# Patient Record
Sex: Female | Born: 1955 | Race: White | Hispanic: No | Marital: Married | Smoking: Former smoker
Health system: Southern US, Community
[De-identification: ages and names within clinical notes are randomized; demographics above are authoritative.]

## PROBLEM LIST (undated history)

## (undated) DIAGNOSIS — F32A Depression, unspecified: Secondary | ICD-10-CM

## (undated) DIAGNOSIS — E039 Hypothyroidism, unspecified: Secondary | ICD-10-CM

## (undated) DIAGNOSIS — F319 Bipolar disorder, unspecified: Secondary | ICD-10-CM

## (undated) DIAGNOSIS — C50919 Malignant neoplasm of unspecified site of unspecified female breast: Principal | ICD-10-CM

## (undated) DIAGNOSIS — M1990 Unspecified osteoarthritis, unspecified site: Secondary | ICD-10-CM

## (undated) DIAGNOSIS — F419 Anxiety disorder, unspecified: Secondary | ICD-10-CM

## (undated) DIAGNOSIS — Z923 Personal history of irradiation: Secondary | ICD-10-CM

## (undated) DIAGNOSIS — E785 Hyperlipidemia, unspecified: Secondary | ICD-10-CM

## (undated) DIAGNOSIS — F19982 Other psychoactive substance use, unspecified with psychoactive substance-induced sleep disorder: Secondary | ICD-10-CM

## (undated) DIAGNOSIS — K219 Gastro-esophageal reflux disease without esophagitis: Secondary | ICD-10-CM

## (undated) DIAGNOSIS — F329 Major depressive disorder, single episode, unspecified: Secondary | ICD-10-CM

## (undated) DIAGNOSIS — M797 Fibromyalgia: Secondary | ICD-10-CM

## (undated) HISTORY — PX: CHOLECYSTECTOMY: SHX55

## (undated) HISTORY — PX: TONSILLECTOMY: SUR1361

## (undated) HISTORY — DX: Hyperlipidemia, unspecified: E78.5

## (undated) HISTORY — DX: Other psychoactive substance use, unspecified with psychoactive substance-induced sleep disorder: F19.982

## (undated) HISTORY — PX: TOTAL ABDOMINAL HYSTERECTOMY W/ BILATERAL SALPINGOOPHORECTOMY: SHX83

## (undated) HISTORY — DX: Gastro-esophageal reflux disease without esophagitis: K21.9

## (undated) HISTORY — PX: GASTRIC BYPASS: SHX52

---

## 1998-05-27 ENCOUNTER — Encounter: Admission: RE | Admit: 1998-05-27 | Discharge: 1998-08-25 | Payer: Self-pay | Admitting: Family Medicine

## 1998-10-29 ENCOUNTER — Ambulatory Visit (HOSPITAL_COMMUNITY): Admission: RE | Admit: 1998-10-29 | Discharge: 1998-10-29 | Payer: Self-pay | Admitting: Cardiovascular Disease

## 1999-05-09 ENCOUNTER — Ambulatory Visit: Admission: RE | Admit: 1999-05-09 | Discharge: 1999-05-09 | Payer: Self-pay | Admitting: Internal Medicine

## 2000-03-30 ENCOUNTER — Other Ambulatory Visit: Admission: RE | Admit: 2000-03-30 | Discharge: 2000-03-30 | Payer: Self-pay | Admitting: Gynecology

## 2001-03-22 ENCOUNTER — Other Ambulatory Visit: Admission: RE | Admit: 2001-03-22 | Discharge: 2001-03-22 | Payer: Self-pay | Admitting: Gynecology

## 2002-03-22 ENCOUNTER — Other Ambulatory Visit: Admission: RE | Admit: 2002-03-22 | Discharge: 2002-03-22 | Payer: Self-pay | Admitting: Gynecology

## 2002-10-24 ENCOUNTER — Encounter: Payer: Self-pay | Admitting: Family Medicine

## 2002-10-24 ENCOUNTER — Encounter: Admission: RE | Admit: 2002-10-24 | Discharge: 2002-10-24 | Payer: Self-pay | Admitting: Family Medicine

## 2002-11-07 ENCOUNTER — Encounter (INDEPENDENT_AMBULATORY_CARE_PROVIDER_SITE_OTHER): Payer: Self-pay

## 2002-11-07 ENCOUNTER — Observation Stay (HOSPITAL_COMMUNITY): Admission: RE | Admit: 2002-11-07 | Discharge: 2002-11-07 | Payer: Self-pay | Admitting: *Deleted

## 2004-10-01 ENCOUNTER — Other Ambulatory Visit: Admission: RE | Admit: 2004-10-01 | Discharge: 2004-10-01 | Payer: Self-pay | Admitting: Gynecology

## 2007-06-02 DIAGNOSIS — Z923 Personal history of irradiation: Secondary | ICD-10-CM

## 2007-06-02 HISTORY — DX: Personal history of irradiation: Z92.3

## 2007-11-02 ENCOUNTER — Encounter: Admission: RE | Admit: 2007-11-02 | Discharge: 2007-11-02 | Payer: Self-pay | Admitting: Family Medicine

## 2007-12-13 DIAGNOSIS — C50919 Malignant neoplasm of unspecified site of unspecified female breast: Secondary | ICD-10-CM

## 2007-12-13 HISTORY — DX: Malignant neoplasm of unspecified site of unspecified female breast: C50.919

## 2007-12-19 ENCOUNTER — Encounter: Admission: RE | Admit: 2007-12-19 | Discharge: 2007-12-19 | Payer: Self-pay | Admitting: Gynecology

## 2007-12-19 IMAGING — MG MM SCREEN MAMMOGRAM BILATERAL
4 series · 4 of 4 positions shown · non-contrast
Comparison: [DATE] [HOSPITAL].

DG SCREEN MAMMOGRAM BILATERAL
Bilateral CC and MLO view(s) were taken.

DIGITAL SCREENING MAMMOGRAM WITH CAD:
Prior films are needed for comparison.  These will be requested prior to interpretation.
Original radiologist requesting films - Dr. BILLIOT - on [DATE].
REVIEW OF OUTSIDE STUDY

[R CC]
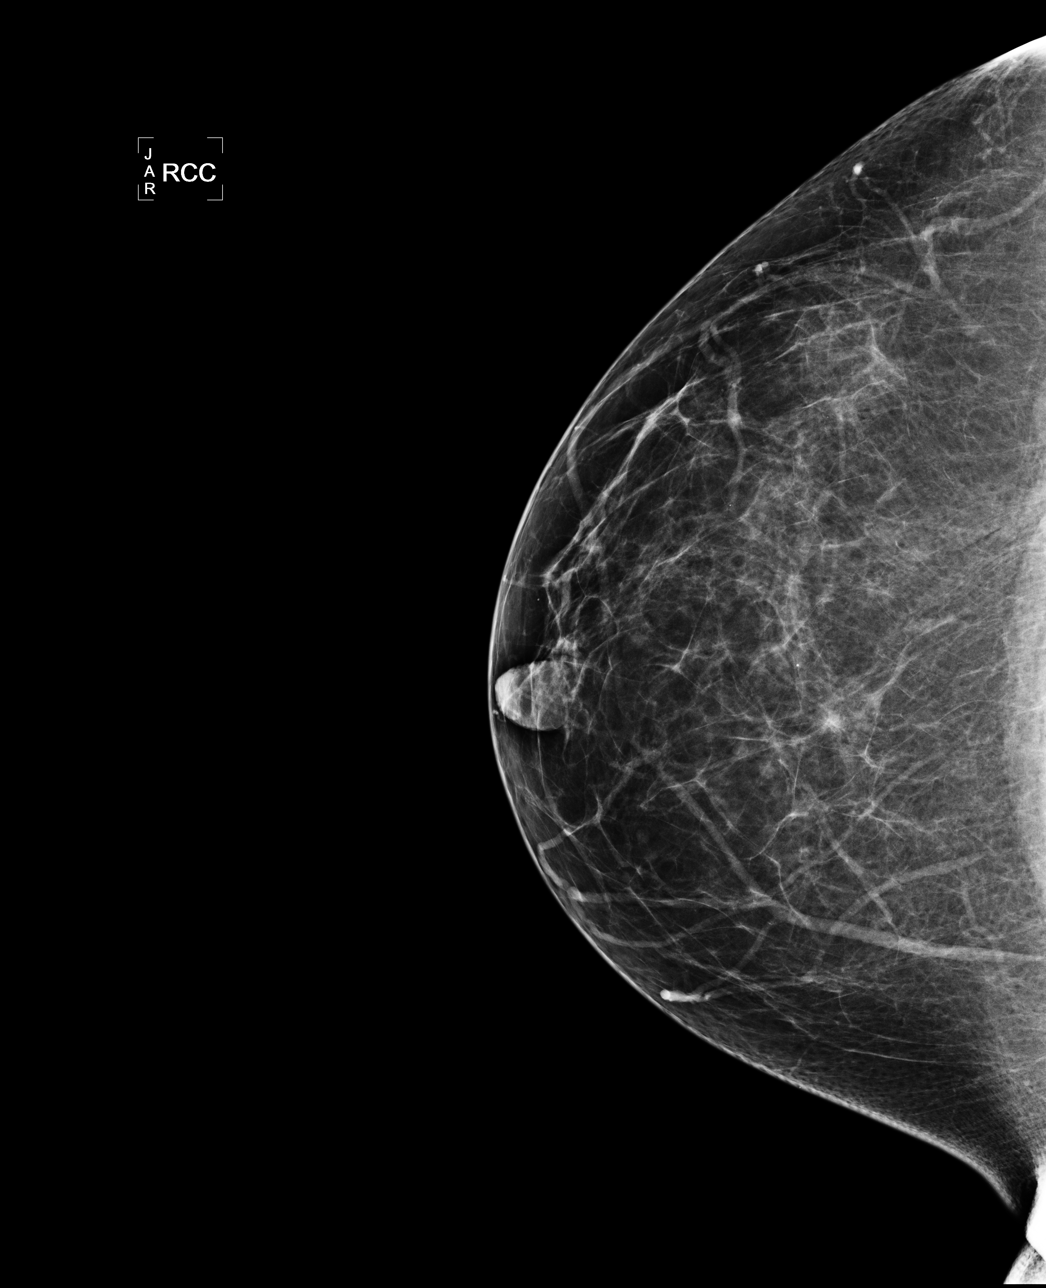

[L CC]
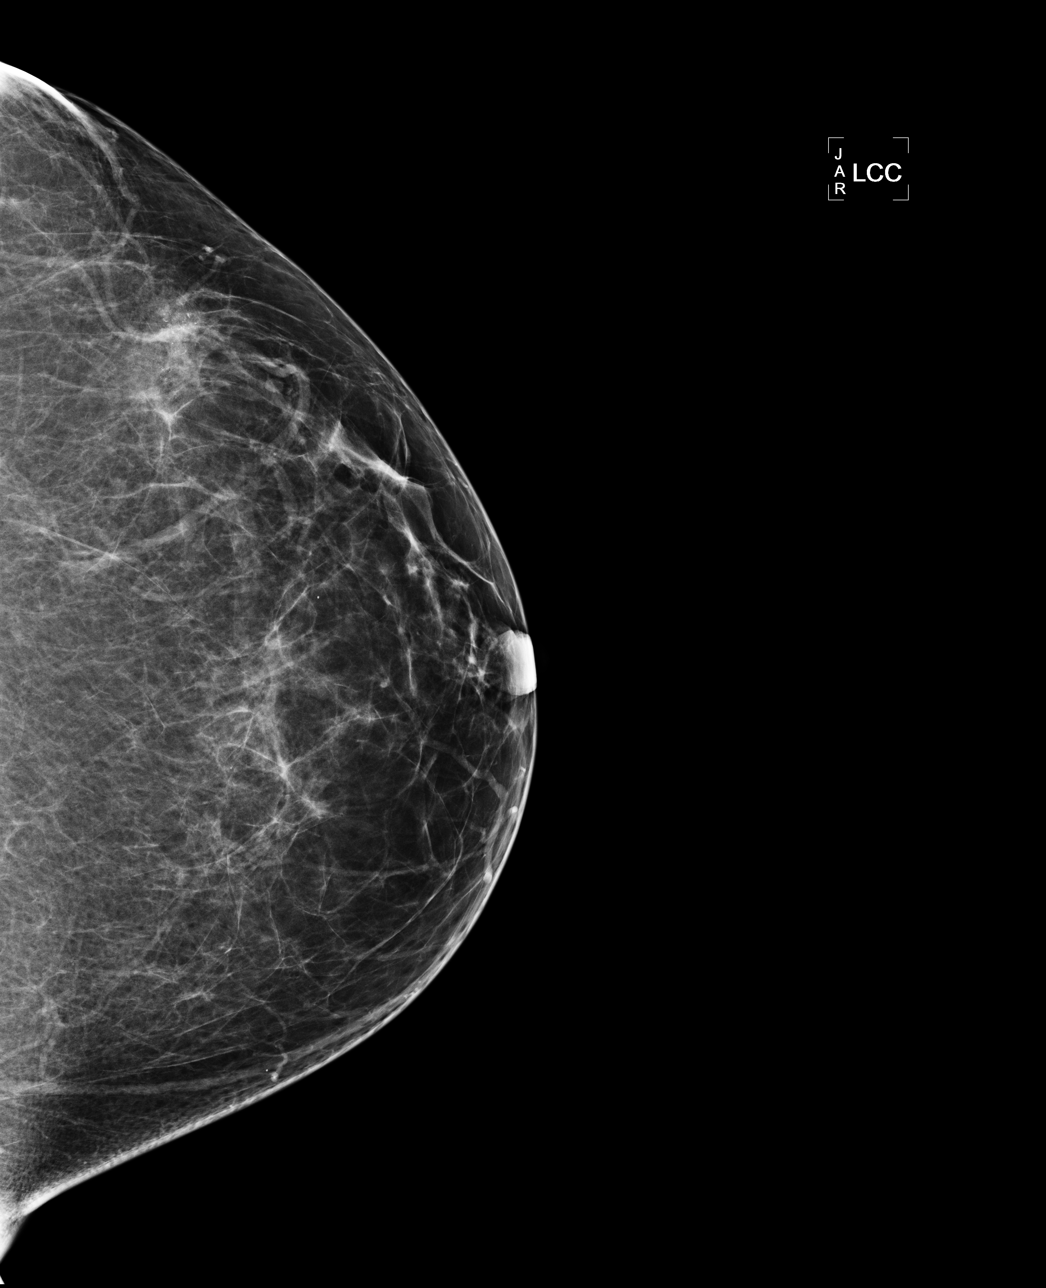

[L MLO]
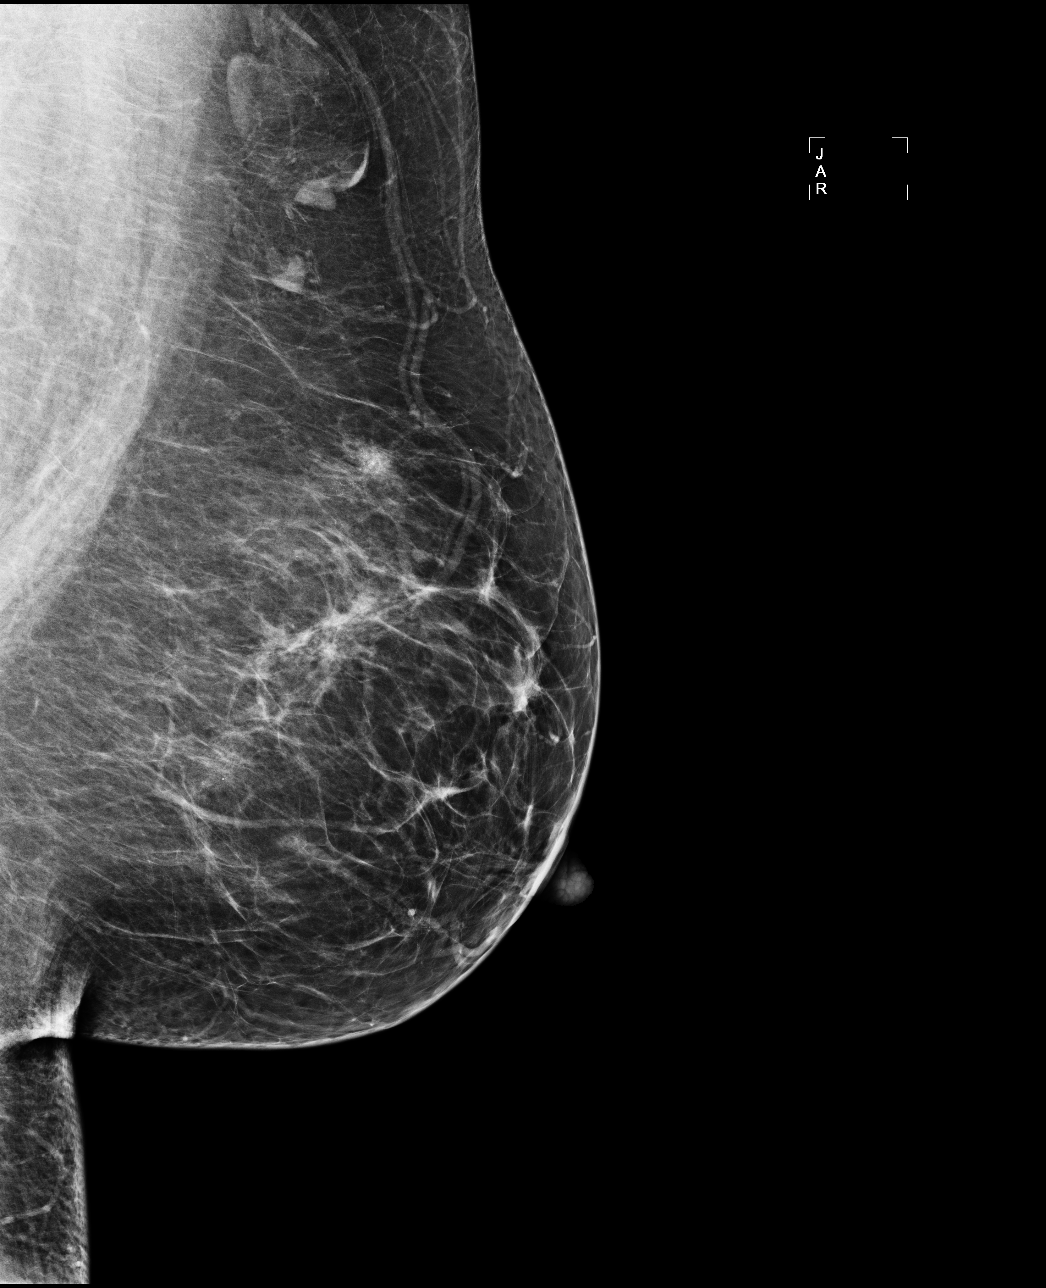

[R MLO]
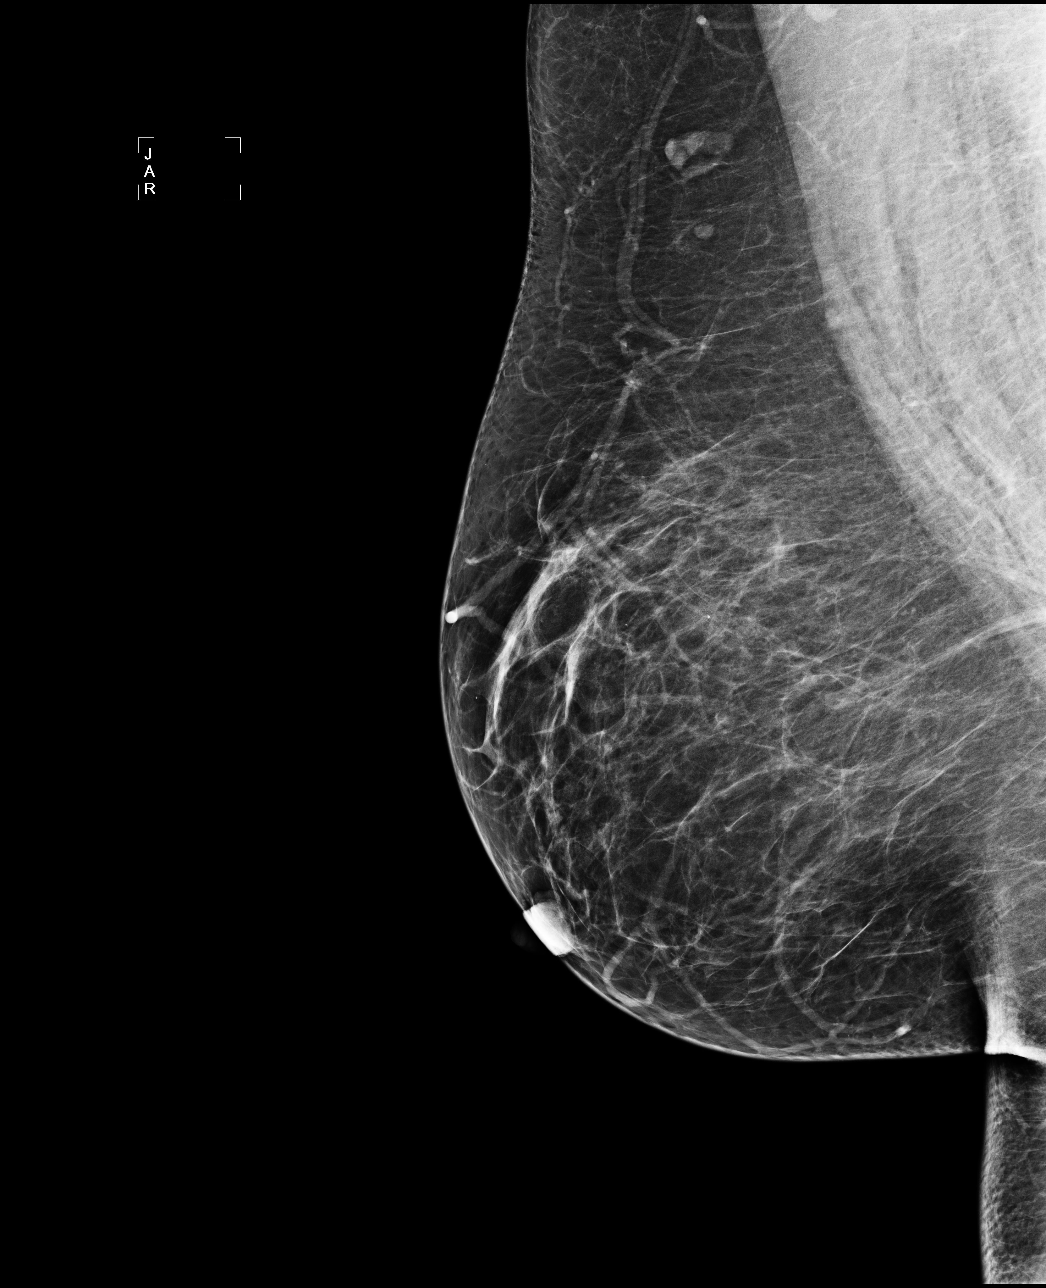

[4 of 4 positions shown; findings below may reference images not displayed]

The breast tissue is almost entirely fatty.  There are calcifications in the left breast.  
Characterization with magnification views is recommended.  The right breast is unremarkable.
IMPRESSION: Calcifications, left breast.  Additional evaluation is indicated.  The patient will be contacted 
for additional studies and a supplemental report will follow.

ASSESSMENT: Need additional imaging evaluation and/or prior mammograms for comparison - BI-RADS 0 -
Left

Further imaging of the left breast.
ANALYZED BY COMPUTER AIDED DETECTION. ,

## 2007-12-30 ENCOUNTER — Encounter: Admission: RE | Admit: 2007-12-30 | Discharge: 2007-12-30 | Payer: Self-pay | Admitting: Gynecology

## 2008-01-02 ENCOUNTER — Other Ambulatory Visit (HOSPITAL_COMMUNITY): Admission: RE | Admit: 2008-01-02 | Discharge: 2008-01-26 | Payer: Self-pay | Admitting: Psychiatry

## 2008-01-03 ENCOUNTER — Ambulatory Visit: Payer: Self-pay | Admitting: Psychiatry

## 2008-01-09 ENCOUNTER — Encounter: Admission: RE | Admit: 2008-01-09 | Discharge: 2008-01-09 | Payer: Self-pay | Admitting: Gynecology

## 2008-01-09 ENCOUNTER — Encounter (INDEPENDENT_AMBULATORY_CARE_PROVIDER_SITE_OTHER): Payer: Self-pay | Admitting: Diagnostic Radiology

## 2008-01-09 HISTORY — PX: BREAST BIOPSY: SHX20

## 2008-01-16 ENCOUNTER — Encounter: Admission: RE | Admit: 2008-01-16 | Discharge: 2008-01-16 | Payer: Self-pay | Admitting: Gynecology

## 2008-01-31 HISTORY — PX: BREAST LUMPECTOMY: SHX2

## 2008-02-14 ENCOUNTER — Encounter (INDEPENDENT_AMBULATORY_CARE_PROVIDER_SITE_OTHER): Payer: Self-pay | Admitting: Surgery

## 2008-02-14 ENCOUNTER — Ambulatory Visit (HOSPITAL_BASED_OUTPATIENT_CLINIC_OR_DEPARTMENT_OTHER): Admission: RE | Admit: 2008-02-14 | Discharge: 2008-02-14 | Payer: Self-pay | Admitting: Surgery

## 2008-02-14 ENCOUNTER — Encounter: Admission: RE | Admit: 2008-02-14 | Discharge: 2008-02-14 | Payer: Self-pay | Admitting: Surgery

## 2008-02-22 ENCOUNTER — Ambulatory Visit: Payer: Self-pay | Admitting: Oncology

## 2008-03-01 ENCOUNTER — Ambulatory Visit: Admission: RE | Admit: 2008-03-01 | Discharge: 2008-05-29 | Payer: Self-pay | Admitting: Radiation Oncology

## 2008-03-15 ENCOUNTER — Observation Stay (HOSPITAL_COMMUNITY): Admission: AD | Admit: 2008-03-15 | Discharge: 2008-03-17 | Payer: Self-pay | Admitting: General Surgery

## 2008-04-17 ENCOUNTER — Ambulatory Visit: Payer: Self-pay | Admitting: Oncology

## 2008-06-04 ENCOUNTER — Ambulatory Visit: Payer: Self-pay | Admitting: Oncology

## 2008-12-26 ENCOUNTER — Encounter: Admission: RE | Admit: 2008-12-26 | Discharge: 2008-12-26 | Payer: Self-pay | Admitting: Radiation Oncology

## 2009-12-30 ENCOUNTER — Encounter: Admission: RE | Admit: 2009-12-30 | Discharge: 2009-12-30 | Payer: Self-pay | Admitting: Radiation Oncology

## 2010-06-21 ENCOUNTER — Other Ambulatory Visit: Payer: Self-pay | Admitting: Radiation Oncology

## 2010-06-21 DIAGNOSIS — Z9889 Other specified postprocedural states: Secondary | ICD-10-CM

## 2010-10-14 NOTE — Discharge Summary (Signed)
Theresa Clay, Theresa Clay NO.:  [id number]   MEDICAL RECORD NO.:  [id number]          PATIENT TYPE:  OBV   LOCATION:  5123                         FACILITY:  [facility]   PHYSICIAN:  Lennie Muckle, MD      DATE OF BIRTH:  1956/05/04   DATE OF ADMISSION:  03/15/2008  DATE OF DISCHARGE:  03/17/2008                               DISCHARGE SUMMARY   ADMITTING DIAGNOSIS:  Left breast cellulitis.   HOSPITAL COURSE:  [name] is a 55 year old female who is admitted  by Dr. Dwain Sarna for Dr. Luisa Hart due to left breast cellulitis.  She had  been seen in clinic and had possibly drainage of a seroma by Dr. Dominga Ferry on Tuesday.  Over the next couple of days, she had increased pain  and redness of her breast.  She was seen in urgent office and felt to  have cellulitis.  She has failed Keflex therapy and therefore was placed  on IV vancomycin.  She has had daily dressing changes and has had some  improvement in the erythema and pain of her breast.  She has had no  fevers.  On examination today, there is only minimal amount of erythema  laterally.  She is somewhat tender.  There is some tissue change;  however, I think overall it looks well today.  I repacked gauze within  the wound but instructed the patient she does not need to continue  packing this.  It is only about a centimeter deep.  She can shower daily  and wash her incisions.  She is supposed to follow up with Dr. Luisa Hart  on Tuesday, and she is given 14 days of doxycycline and she might have  some cultures in the clinic that all need to be followed up on but not  here at the hospital.   She has no restrictions.      Lennie Muckle, MD  Electronically Signed     ALA/MEDQ  D:  03/17/2008  T:  03/17/2008  Job:  [id number]

## 2010-10-14 NOTE — Op Note (Signed)
NAMEBETHANN, Theresa Clay             ACCOUNT NO.:  [id number]   MEDICAL RECORD NO.:  [id number]          PATIENT TYPE:  AMB   LOCATION:  DSC                          FACILITY:  [facility]   PHYSICIAN:  Thomas A. 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   PROCEDURE:  1. Left breast needle-localized lumpectomy.  2. Left axillary sentinel lymph node mapping with injection of      methylene blue dye.   SURGEON:  Maisie Fus A. Cornett, MD   ANESTHESIA:  LMA with 0.25% Sensorcaine local.   ESTIMATED BLOOD LOSS:  20 mL.   SPECIMEN:  1. Left breast lumpectomy tissue.  2. Three left axillary sentinel lymph nodes, negative by touch prep.   DRAINS:  None.   INDICATIONS FOR PROCEDURE:  The patient is a 55 year old female found to  have an atypical cluster of left breast microcalcifications.  Core  biopsy showed high-grade DCIS.  She wished to conserve her breast.  She  presents today for left breast lumpectomy.  We will also do a sentinel  lymph node mapping procedure given that this area is in left upper outer  quadrant of the breast.  Informed consent was obtained.  She underwent  injection of technetium sulfur colloid and wire localization prior to  the procedure.   DESCRIPTION OF PROCEDURE:  The patient was brought to the operating room  after wire localization and injection of technetium sulfur colloid in  the left breast.  After sterile prep and drape, 4 mL of methylene blue  dye were injected in a subareolar position in the left breast and  massage.  Curvilinear incision was made in the left upper outer quadrant  of the breast.  NeoProbe was used to identify 3 sentinel nodes in level  I axillary nodes on the left, one was blue and hot  and the other two  were adjust hot.  These were sent to pathology and were negative by  touch prep.  We then excised the tissue around the tip of the wire.  This was done with cautery to the same incision.  Specimens were taken  out and radiography revealed it to be intact with calcifications clipped  and wire in place.  I took some additional superior, inferior,  posterior, and medial margins.  Irrigation was used and suctioned out.  Hemostasis was  excellent.  The wound was closed in layers with the deep layer of 3-0  Vicryl and a subsequent 4-0 Monocryl layer.  Dermabond was applied as  dressing.  All final counts of sponge, needle, and instruments were  found to be correct in this portion of the case.  The patient was awoke  and taken to recovery in satisfactory condition.      Thomas A. Cornett, M.D.  Electronically Signed     TAC/MEDQ  D:  02/14/2008  T:  02/15/2008  Job:  [id number]  cc:   Carola J. Gerri Spore, M.D.  Leatha Gilding. Mezer, M.D.

## 2010-10-17 NOTE — Op Note (Signed)
NAME:  Theresa Clay, Theresa Clay                       ACCOUNT NO.:  [id number]   MEDICAL RECORD NO.:  [id number]                   PATIENT TYPE:  AMB   LOCATION:  DAY                                  FACILITY:  [facility]   PHYSICIAN:  Vikki Ports, M.D.         DATE OF BIRTH:  August 21, 1955   DATE OF PROCEDURE:  11/07/2002  DATE OF DISCHARGE:                                 OPERATIVE REPORT   PREOPERATIVE DIAGNOSES:  1. Status post gastric bypass  2. Symptomatic cholelithiasis.   POSTOPERATIVE DIAGNOSES:  1. Status post gastric bypass.  2  Symptomatic cholelithiasis.   OPERATION/PROCEDURE:  Laparoscopic cholecystectomy with intraoperative  cholangiogram.   FINDINGS:  Normal cholangiogram and minimal intra-abdominal adhesions.   SURGEON:  Vikki Ports, M.D.   ASSISTANTSheppard Plumber. Earlene Plater, M.D.   ANESTHESIA:  General.   DESCRIPTION OF PROCEDURE:  The patient was taken to the operating room and  placed in the supine position. After adequate anesthesia was induced using  the endotracheal tube, the abdomen was prepped and draped in the normal  sterile fashion.  Using a transverse infraumbilical incision, I dissected  down to the fascia.  The fascia was opened vertically.  An 0 Vicryl  pursestring suture was placed around the fascial defect.  Hasson trocar was  placed in the abdomen and the abdomen was insufflated with carbon dioxide.  Under direct visualization a 10 mm port was placed in the subxiphoid region.  Two 5 mm ports were placed in the right abdomen.  Gallbladder was identified  and retracted cephalad.  The previous Roux limb as it descended into  antecolic and antigastric towards the proximal stomach pouch could be  visualized.  The gastrojejunostomy could not be visualized.  Inspection near  the neck of the gallbladder easily identified  the cystic duct.  It clipped  up on the neck and small ductotomy was made.  During ductotomy a small  arterial bleeder was  encountered, was clipped and then endolooped.  A  ductogram was then performed showing no evidence of filling defects, free  flow into the duodenum and good filling of both hepatic ducts.  The  cholangiocatheter was removed and the cystic duct was clipped with three  additional clips and divided.  The cystic artery was then dissected in a  similar fashion, triply clipped and divided and the gallbladder was taken  off the gallbladder bed using Bovie electrocautery and removed through the  umbilical port.  Adequate hemostasis was insured.  The infraumbilical  fascial defect was closed with the 0 Vicryl pursestring suture.  Skin  incisions were  closed with subcuticular 4-0 Monocryl.  Incisions were injected with using  0.5 Marcaine.  Sterile dressings were applied.  The patient tolerated the  procedure well and went to PACU in good condition.  Vikki Ports, M.D.    KRH/MEDQ  D:  11/07/2002  T:  11/07/2002  Job:  [id number]

## 2011-03-02 LAB — POCT HEMOGLOBIN-HEMACUE: Hemoglobin: 14.9

## 2011-03-03 LAB — BASIC METABOLIC PANEL
BUN: 5 — ABNORMAL LOW
CO2: 23
Calcium: 8.9
Chloride: 109
Creatinine, Ser: 0.88
GFR calc Af Amer: >60
GFR calc non Af Amer: >60
Glucose, Bld: 136 — ABNORMAL HIGH
Potassium: 3.5
Sodium: 140

## 2011-03-03 LAB — CBC
HCT: 34.8 — ABNORMAL LOW
Hemoglobin: 11.6 — ABNORMAL LOW
MCHC: 33.2
MCV: 85.1
Platelets: 303
RBC: 4.09
RDW: 14.5
WBC: 8.9

## 2011-03-04 LAB — BASIC METABOLIC PANEL
BUN: 7
CO2: 26
Calcium: 9.3
Chloride: 106
Creatinine, Ser: 0.79
GFR calc Af Amer: >60
GFR calc non Af Amer: >60
Glucose, Bld: 101 — ABNORMAL HIGH
Potassium: 4.1
Sodium: 141

## 2011-03-04 LAB — DIFFERENTIAL
Basophils Absolute: 0
Basophils Relative: 0
Eosinophils Absolute: 0.2
Eosinophils Relative: 4
Lymphocytes Relative: 27
Lymphs Abs: 1.7
Monocytes Absolute: 0.4
Monocytes Relative: 6
Neutro Abs: 4
Neutrophils Relative %: 63

## 2011-03-04 LAB — CANCER ANTIGEN 27.29: CA 27.29: 19

## 2011-03-04 LAB — CBC
HCT: 41.6
Hemoglobin: 14.1
MCHC: 33.9
MCV: 83.4
Platelets: 237
RBC: 4.99
RDW: 15.1
WBC: 6.3

## 2011-03-20 ENCOUNTER — Ambulatory Visit
Admission: RE | Admit: 2011-03-20 | Discharge: 2011-03-20 | Disposition: A | Discharge status: Home / Self Care | Payer: Medicare Other | Source: Ambulatory Visit | Attending: Radiation Oncology | Admitting: Radiation Oncology

## 2011-03-20 DIAGNOSIS — Z9889 Other specified postprocedural states: Secondary | ICD-10-CM

## 2011-03-26 ENCOUNTER — Other Ambulatory Visit: Payer: Self-pay | Admitting: Radiation Oncology

## 2011-03-26 ENCOUNTER — Ambulatory Visit
Admission: RE | Admit: 2011-03-26 | Discharge: 2011-03-26 | Disposition: A | Discharge status: Home / Self Care | Payer: Medicare Other | Source: Ambulatory Visit | Attending: Radiation Oncology | Admitting: Radiation Oncology

## 2011-03-26 DIAGNOSIS — Z853 Personal history of malignant neoplasm of breast: Secondary | ICD-10-CM

## 2011-06-12 DIAGNOSIS — F330 Major depressive disorder, recurrent, mild: Secondary | ICD-10-CM | POA: Diagnosis not present

## 2011-06-16 DIAGNOSIS — F330 Major depressive disorder, recurrent, mild: Secondary | ICD-10-CM | POA: Diagnosis not present

## 2011-07-18 DIAGNOSIS — J069 Acute upper respiratory infection, unspecified: Secondary | ICD-10-CM | POA: Diagnosis not present

## 2011-07-27 DIAGNOSIS — F330 Major depressive disorder, recurrent, mild: Secondary | ICD-10-CM | POA: Diagnosis not present

## 2011-08-24 DIAGNOSIS — F330 Major depressive disorder, recurrent, mild: Secondary | ICD-10-CM | POA: Diagnosis not present

## 2011-09-15 DIAGNOSIS — F330 Major depressive disorder, recurrent, mild: Secondary | ICD-10-CM | POA: Diagnosis not present

## 2011-09-16 DIAGNOSIS — M25569 Pain in unspecified knee: Secondary | ICD-10-CM | POA: Diagnosis not present

## 2011-09-16 DIAGNOSIS — M25469 Effusion, unspecified knee: Secondary | ICD-10-CM | POA: Diagnosis not present

## 2011-09-29 DIAGNOSIS — M25469 Effusion, unspecified knee: Secondary | ICD-10-CM | POA: Diagnosis not present

## 2011-09-29 DIAGNOSIS — M25569 Pain in unspecified knee: Secondary | ICD-10-CM | POA: Diagnosis not present

## 2011-10-02 DIAGNOSIS — F330 Major depressive disorder, recurrent, mild: Secondary | ICD-10-CM | POA: Diagnosis not present

## 2011-10-06 DIAGNOSIS — M25569 Pain in unspecified knee: Secondary | ICD-10-CM | POA: Diagnosis not present

## 2011-10-06 DIAGNOSIS — M25469 Effusion, unspecified knee: Secondary | ICD-10-CM | POA: Diagnosis not present

## 2011-10-08 DIAGNOSIS — M25569 Pain in unspecified knee: Secondary | ICD-10-CM | POA: Diagnosis not present

## 2011-10-08 DIAGNOSIS — M25469 Effusion, unspecified knee: Secondary | ICD-10-CM | POA: Diagnosis not present

## 2011-10-15 DIAGNOSIS — M25569 Pain in unspecified knee: Secondary | ICD-10-CM | POA: Diagnosis not present

## 2011-10-15 DIAGNOSIS — M25469 Effusion, unspecified knee: Secondary | ICD-10-CM | POA: Diagnosis not present

## 2011-10-20 DIAGNOSIS — M25569 Pain in unspecified knee: Secondary | ICD-10-CM | POA: Diagnosis not present

## 2011-10-20 DIAGNOSIS — M25539 Pain in unspecified wrist: Secondary | ICD-10-CM | POA: Diagnosis not present

## 2011-10-20 DIAGNOSIS — M25469 Effusion, unspecified knee: Secondary | ICD-10-CM | POA: Diagnosis not present

## 2011-10-27 DIAGNOSIS — M25469 Effusion, unspecified knee: Secondary | ICD-10-CM | POA: Diagnosis not present

## 2011-10-27 DIAGNOSIS — M25569 Pain in unspecified knee: Secondary | ICD-10-CM | POA: Diagnosis not present

## 2011-10-29 DIAGNOSIS — M25569 Pain in unspecified knee: Secondary | ICD-10-CM | POA: Diagnosis not present

## 2011-10-31 HISTORY — PX: KNEE SURGERY: SHX244

## 2011-11-02 DIAGNOSIS — F330 Major depressive disorder, recurrent, mild: Secondary | ICD-10-CM | POA: Diagnosis not present

## 2011-11-02 DIAGNOSIS — M25569 Pain in unspecified knee: Secondary | ICD-10-CM | POA: Diagnosis not present

## 2011-11-19 DIAGNOSIS — M238X9 Other internal derangements of unspecified knee: Secondary | ICD-10-CM | POA: Diagnosis not present

## 2011-11-19 DIAGNOSIS — G8918 Other acute postprocedural pain: Secondary | ICD-10-CM | POA: Diagnosis not present

## 2011-11-19 DIAGNOSIS — M2240 Chondromalacia patellae, unspecified knee: Secondary | ICD-10-CM | POA: Diagnosis not present

## 2011-11-19 DIAGNOSIS — M9420 Chondromalacia, unspecified site: Secondary | ICD-10-CM | POA: Diagnosis not present

## 2011-12-07 DIAGNOSIS — M2240 Chondromalacia patellae, unspecified knee: Secondary | ICD-10-CM | POA: Diagnosis not present

## 2011-12-07 DIAGNOSIS — M25569 Pain in unspecified knee: Secondary | ICD-10-CM | POA: Diagnosis not present

## 2011-12-09 DIAGNOSIS — M2240 Chondromalacia patellae, unspecified knee: Secondary | ICD-10-CM | POA: Diagnosis not present

## 2011-12-09 DIAGNOSIS — M25569 Pain in unspecified knee: Secondary | ICD-10-CM | POA: Diagnosis not present

## 2011-12-17 DIAGNOSIS — M2240 Chondromalacia patellae, unspecified knee: Secondary | ICD-10-CM | POA: Diagnosis not present

## 2011-12-17 DIAGNOSIS — M25569 Pain in unspecified knee: Secondary | ICD-10-CM | POA: Diagnosis not present

## 2011-12-21 DIAGNOSIS — M25569 Pain in unspecified knee: Secondary | ICD-10-CM | POA: Diagnosis not present

## 2011-12-21 DIAGNOSIS — M2240 Chondromalacia patellae, unspecified knee: Secondary | ICD-10-CM | POA: Diagnosis not present

## 2011-12-24 DIAGNOSIS — M2240 Chondromalacia patellae, unspecified knee: Secondary | ICD-10-CM | POA: Diagnosis not present

## 2011-12-24 DIAGNOSIS — M25569 Pain in unspecified knee: Secondary | ICD-10-CM | POA: Diagnosis not present

## 2011-12-28 DIAGNOSIS — M2240 Chondromalacia patellae, unspecified knee: Secondary | ICD-10-CM | POA: Diagnosis not present

## 2011-12-28 DIAGNOSIS — M25569 Pain in unspecified knee: Secondary | ICD-10-CM | POA: Diagnosis not present

## 2011-12-31 DIAGNOSIS — M25569 Pain in unspecified knee: Secondary | ICD-10-CM | POA: Diagnosis not present

## 2011-12-31 DIAGNOSIS — M2240 Chondromalacia patellae, unspecified knee: Secondary | ICD-10-CM | POA: Diagnosis not present

## 2012-01-04 DIAGNOSIS — M25569 Pain in unspecified knee: Secondary | ICD-10-CM | POA: Diagnosis not present

## 2012-01-05 DIAGNOSIS — F330 Major depressive disorder, recurrent, mild: Secondary | ICD-10-CM | POA: Diagnosis not present

## 2012-01-12 DIAGNOSIS — M2240 Chondromalacia patellae, unspecified knee: Secondary | ICD-10-CM | POA: Diagnosis not present

## 2012-01-12 DIAGNOSIS — M25569 Pain in unspecified knee: Secondary | ICD-10-CM | POA: Diagnosis not present

## 2012-01-14 DIAGNOSIS — M2240 Chondromalacia patellae, unspecified knee: Secondary | ICD-10-CM | POA: Diagnosis not present

## 2012-01-21 DIAGNOSIS — M2240 Chondromalacia patellae, unspecified knee: Secondary | ICD-10-CM | POA: Diagnosis not present

## 2012-01-25 DIAGNOSIS — M25569 Pain in unspecified knee: Secondary | ICD-10-CM | POA: Diagnosis not present

## 2012-01-29 DIAGNOSIS — M25569 Pain in unspecified knee: Secondary | ICD-10-CM | POA: Diagnosis not present

## 2012-01-29 DIAGNOSIS — M2240 Chondromalacia patellae, unspecified knee: Secondary | ICD-10-CM | POA: Diagnosis not present

## 2012-02-03 DIAGNOSIS — F330 Major depressive disorder, recurrent, mild: Secondary | ICD-10-CM | POA: Diagnosis not present

## 2012-02-05 DIAGNOSIS — M2240 Chondromalacia patellae, unspecified knee: Secondary | ICD-10-CM | POA: Diagnosis not present

## 2012-02-09 DIAGNOSIS — M2240 Chondromalacia patellae, unspecified knee: Secondary | ICD-10-CM | POA: Diagnosis not present

## 2012-02-12 DIAGNOSIS — M25569 Pain in unspecified knee: Secondary | ICD-10-CM | POA: Diagnosis not present

## 2012-02-12 DIAGNOSIS — M2240 Chondromalacia patellae, unspecified knee: Secondary | ICD-10-CM | POA: Diagnosis not present

## 2012-02-15 DIAGNOSIS — M2240 Chondromalacia patellae, unspecified knee: Secondary | ICD-10-CM | POA: Diagnosis not present

## 2012-02-17 DIAGNOSIS — F330 Major depressive disorder, recurrent, mild: Secondary | ICD-10-CM | POA: Diagnosis not present

## 2012-02-18 DIAGNOSIS — M2240 Chondromalacia patellae, unspecified knee: Secondary | ICD-10-CM | POA: Diagnosis not present

## 2012-02-22 DIAGNOSIS — M2240 Chondromalacia patellae, unspecified knee: Secondary | ICD-10-CM | POA: Diagnosis not present

## 2012-02-25 DIAGNOSIS — F330 Major depressive disorder, recurrent, mild: Secondary | ICD-10-CM | POA: Diagnosis not present

## 2012-02-25 DIAGNOSIS — M2240 Chondromalacia patellae, unspecified knee: Secondary | ICD-10-CM | POA: Diagnosis not present

## 2012-03-03 DIAGNOSIS — M25569 Pain in unspecified knee: Secondary | ICD-10-CM | POA: Diagnosis not present

## 2012-03-08 DIAGNOSIS — M25569 Pain in unspecified knee: Secondary | ICD-10-CM | POA: Diagnosis not present

## 2012-03-08 DIAGNOSIS — M2240 Chondromalacia patellae, unspecified knee: Secondary | ICD-10-CM | POA: Diagnosis not present

## 2012-03-11 DIAGNOSIS — M2240 Chondromalacia patellae, unspecified knee: Secondary | ICD-10-CM | POA: Diagnosis not present

## 2012-03-14 DIAGNOSIS — M25569 Pain in unspecified knee: Secondary | ICD-10-CM | POA: Diagnosis not present

## 2012-03-14 DIAGNOSIS — M2240 Chondromalacia patellae, unspecified knee: Secondary | ICD-10-CM | POA: Diagnosis not present

## 2012-03-25 ENCOUNTER — Ambulatory Visit
Admission: RE | Admit: 2012-03-25 | Discharge: 2012-03-25 | Disposition: A | Discharge status: Home / Self Care | Payer: 59 | Source: Ambulatory Visit | Attending: Radiation Oncology | Admitting: Radiation Oncology

## 2012-03-25 DIAGNOSIS — Z853 Personal history of malignant neoplasm of breast: Secondary | ICD-10-CM

## 2012-03-30 DIAGNOSIS — M25569 Pain in unspecified knee: Secondary | ICD-10-CM | POA: Diagnosis not present

## 2012-03-31 ENCOUNTER — Ambulatory Visit: Payer: 59 | Admitting: Radiation Oncology

## 2012-03-31 ENCOUNTER — Encounter: Payer: Self-pay | Admitting: Radiation Oncology

## 2012-03-31 ENCOUNTER — Ambulatory Visit
Admission: RE | Admit: 2012-03-31 | Discharge: 2012-03-31 | Disposition: A | Discharge status: Home / Self Care | Payer: Medicare Other | Source: Ambulatory Visit | Attending: Radiation Oncology | Admitting: Radiation Oncology

## 2012-03-31 VITALS — BP 117/71 | HR 79 | Temp 98.0°F | Resp 20 | Wt 169.3 lb

## 2012-03-31 DIAGNOSIS — F319 Bipolar disorder, unspecified: Secondary | ICD-10-CM | POA: Insufficient documentation

## 2012-03-31 DIAGNOSIS — M797 Fibromyalgia: Secondary | ICD-10-CM | POA: Insufficient documentation

## 2012-03-31 DIAGNOSIS — Z923 Personal history of irradiation: Secondary | ICD-10-CM | POA: Insufficient documentation

## 2012-03-31 DIAGNOSIS — C50919 Malignant neoplasm of unspecified site of unspecified female breast: Secondary | ICD-10-CM

## 2012-03-31 DIAGNOSIS — M1990 Unspecified osteoarthritis, unspecified site: Secondary | ICD-10-CM | POA: Insufficient documentation

## 2012-03-31 HISTORY — DX: Malignant neoplasm of unspecified site of unspecified female breast: C50.919

## 2012-03-31 HISTORY — DX: Unspecified osteoarthritis, unspecified site: M19.90

## 2012-03-31 HISTORY — DX: Bipolar disorder, unspecified: F31.9

## 2012-03-31 HISTORY — DX: Fibromyalgia: M79.7

## 2012-03-31 HISTORY — DX: Personal history of irradiation: Z92.3

## 2012-03-31 NOTE — Progress Notes (Signed)
Pt had right knee reconstruction June 2013, states she ahs "just begun to walk again". Pt denies pain, loss of appetite, does report fatigue.

## 2012-04-01 NOTE — Progress Notes (Signed)
   Department of Radiation Oncology  Phone:  [phone] Fax:        [phone]   Name: Theresa Clay   DOB: Sep 24, 1955  MRN: [id number]    Date: 04/01/2012  Follow Up Visit Note  Diagnosis: DCIS of the left breast  Interval since last radiation: 4 years  Interval History: Theresa Clay presents today for routine followup.  She is feeling well and doing well. She had a right reconstruction in June and is just recovering from that. She continues to have tenderness at her lumpectomy incision site. She had negative mammograms this month. She has no new areas of concern in either breast. No new adenopathy.  Allergies:  Allergies  Allergen Reactions  . Darvocet (Propoxyphene-Acetaminophen) Anaphylaxis  . Penicillins Anaphylaxis    Medications:  Current Outpatient Prescriptions  Medication Sig Dispense Refill  . acetaminophen-codeine (TYLENOL #3) 300-30 MG per tablet every 4 (four) hours as needed.       . CELEBREX 200 MG capsule 400 mg daily.       Marland Kitchen lamoTRIgine (LAMICTAL) 100 MG tablet 300 mg.       . levothyroxine (SYNTHROID, LEVOTHROID) 50 MCG tablet 50 mcg daily.       Marland Kitchen LIDODERM 5 %       . lithium carbonate 300 MG capsule 300 mg 2 (two) times daily with a meal.       . LORazepam (ATIVAN) 0.5 MG tablet 1 mg daily as needed.       Marland Kitchen omeprazole (PRILOSEC) 40 MG capsule 40 mg daily.       . sertraline (ZOLOFT) 100 MG tablet 100 mg daily.       . traZODone (DESYREL) 100 MG tablet 300 mg at bedtime.         Physical Exam:   weight is 169 lb 4.8 oz (76.794 kg). Her oral temperature is 98 F (36.7 C). Her blood pressure is 117/71 and her pulse is 79. Her respiration is 20.  She still has tenderness to palpation in the outer aspect of her left breast. She still has the pocketing of her skin near her incision in the upper outer quadrant. No palpable abnormalities of the right breast. No adenopathy bilaterally.  IMPRESSION: Theresa Clay is a 56 y.o. female status post breast conservation  with no evidence of disease  PLAN:  I will looks great. I will see her back in a year with mammograms at that time. At that point I will release her from followup back to her primary care. She knows to contact me with any questions or concerns.    Lurline Hare, MD

## 2012-04-04 DIAGNOSIS — D235 Other benign neoplasm of skin of trunk: Secondary | ICD-10-CM | POA: Diagnosis not present

## 2012-04-05 DIAGNOSIS — Z0000 Encounter for general adult medical examination without abnormal findings: Secondary | ICD-10-CM | POA: Diagnosis not present

## 2012-04-05 DIAGNOSIS — G4700 Insomnia, unspecified: Secondary | ICD-10-CM | POA: Diagnosis not present

## 2012-04-05 DIAGNOSIS — E7800 Pure hypercholesterolemia, unspecified: Secondary | ICD-10-CM | POA: Diagnosis not present

## 2012-04-05 DIAGNOSIS — R413 Other amnesia: Secondary | ICD-10-CM | POA: Diagnosis not present

## 2012-04-05 DIAGNOSIS — K219 Gastro-esophageal reflux disease without esophagitis: Secondary | ICD-10-CM | POA: Diagnosis not present

## 2012-04-05 DIAGNOSIS — Z79899 Other long term (current) drug therapy: Secondary | ICD-10-CM | POA: Diagnosis not present

## 2012-04-05 DIAGNOSIS — E039 Hypothyroidism, unspecified: Secondary | ICD-10-CM | POA: Diagnosis not present

## 2012-04-05 DIAGNOSIS — IMO0001 Reserved for inherently not codable concepts without codable children: Secondary | ICD-10-CM | POA: Diagnosis not present

## 2012-04-05 DIAGNOSIS — Z23 Encounter for immunization: Secondary | ICD-10-CM | POA: Diagnosis not present

## 2012-04-06 DIAGNOSIS — F330 Major depressive disorder, recurrent, mild: Secondary | ICD-10-CM | POA: Diagnosis not present

## 2012-05-02 DIAGNOSIS — F330 Major depressive disorder, recurrent, mild: Secondary | ICD-10-CM | POA: Diagnosis not present

## 2012-05-02 DIAGNOSIS — H524 Presbyopia: Secondary | ICD-10-CM | POA: Diagnosis not present

## 2012-05-02 DIAGNOSIS — H5210 Myopia, unspecified eye: Secondary | ICD-10-CM | POA: Diagnosis not present

## 2012-05-10 DIAGNOSIS — E7800 Pure hypercholesterolemia, unspecified: Secondary | ICD-10-CM | POA: Diagnosis not present

## 2012-05-10 DIAGNOSIS — E538 Deficiency of other specified B group vitamins: Secondary | ICD-10-CM | POA: Diagnosis not present

## 2012-06-16 DIAGNOSIS — F330 Major depressive disorder, recurrent, mild: Secondary | ICD-10-CM | POA: Diagnosis not present

## 2012-06-25 DIAGNOSIS — M25569 Pain in unspecified knee: Secondary | ICD-10-CM | POA: Diagnosis not present

## 2012-07-04 DIAGNOSIS — F330 Major depressive disorder, recurrent, mild: Secondary | ICD-10-CM | POA: Diagnosis not present

## 2012-07-07 DIAGNOSIS — F330 Major depressive disorder, recurrent, mild: Secondary | ICD-10-CM | POA: Diagnosis not present

## 2012-07-19 DIAGNOSIS — F330 Major depressive disorder, recurrent, mild: Secondary | ICD-10-CM | POA: Diagnosis not present

## 2012-07-27 DIAGNOSIS — M25569 Pain in unspecified knee: Secondary | ICD-10-CM | POA: Diagnosis not present

## 2012-08-03 ENCOUNTER — Encounter (HOSPITAL_BASED_OUTPATIENT_CLINIC_OR_DEPARTMENT_OTHER): Payer: Self-pay | Admitting: *Deleted

## 2012-08-03 NOTE — Progress Notes (Signed)
Bring all medications

## 2012-08-05 NOTE — H&P (Signed)
Theresa Clay is an 57 y.o. female.   Chief Complaint: Left knee pain HPI: Theresa Clay has a painful catch in her knee.  She had previous knee surgery in June of 2013 and has been undergoing significant physical therapy.  We have given her one injection about a month ago which helped for a few days.  Her knee No longer slips out of place but there is a painful catch with motion.  We have discussed proceeding with a left knee arthroscopy for the removal of any loose bodies and inspection of her joint.  Past Medical History  Diagnosis Date  . Breast cancer 12/13/07    left , ER/PR +  . Hx of radiation therapy 04/04/08-05/17/08    left breast, tumor bed  . Bipolar 1 disorder   . Fibromyalgia   . Osteoarthritis   . Hypothyroidism   . Depression   . Anxiety   . GERD (gastroesophageal reflux disease)     Past Surgical History  Procedure Laterality Date  . Knee surgery  10/2011    reconstruction  . Gastric bypass    . Tonsillectomy    . Cholecystectomy    . Total abdominal hysterectomy w/ bilateral salpingoophorectomy      Family History  Problem Relation Age of Onset  . Breast cancer Mother 62   Social History:  reports that she has quit smoking. She does not have any smokeless tobacco history on file. She reports that she does not drink alcohol or use illicit drugs.  Allergies:  Allergies  Allergen Reactions  . Darvocet (Propoxyphene-Acetaminophen) Anaphylaxis  . Penicillins Anaphylaxis  . Codeine Other (See Comments)    "double over in pain" per pt    No prescriptions prior to admission    No results found for this or any previous visit (from the past 48 hour(s)). No results found.  Review of Systems  Constitutional: Negative.   HENT: Negative.   Eyes: Negative.   Respiratory: Negative.   Cardiovascular: Negative.   Gastrointestinal: Negative.   Genitourinary: Negative.   Musculoskeletal: Positive for joint pain.  Skin: Negative.   Neurological: Negative.    Endo/Heme/Allergies: Negative.   Psychiatric/Behavioral: Negative.     Height 5\' 2"  (1.575 m), weight 77.111 kg (170 lb). Physical Exam  Constitutional: She is oriented to person, place, and time. She appears well-nourished.  HENT:  Head: Atraumatic.  Eyes: Conjunctivae are normal.  Neck: Neck supple.  Cardiovascular: Regular rhythm.   Respiratory: Breath sounds normal.  GI: Bowel sounds are normal.  Neurological: She is oriented to person, place, and time.  Left knee exam: No effusion.  Well-healed incisions.  Range of motion 0-1 30.  She does have crepitation at full extension.  There is pain at the patellofemoral joint with compression.  Skin: Skin is warm.  Psychiatric: She has a normal mood and affect.     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  We have discussed proceeding with a left knee arthroscopy and the risks of anesthesia, infection as well as potential DVT related to knee arthroscopy.  She will need continued postoperative physical therapy to get the best results.  Mansel Strother R 08/05/2012, 8:39 AM

## 2012-08-08 ENCOUNTER — Other Ambulatory Visit: Payer: Self-pay | Admitting: Orthopaedic Surgery

## 2012-08-09 ENCOUNTER — Encounter (HOSPITAL_BASED_OUTPATIENT_CLINIC_OR_DEPARTMENT_OTHER)
Admission: RE | Disposition: A | Discharge status: Home / Self Care | Payer: Self-pay | Source: Ambulatory Visit | Attending: Orthopaedic Surgery

## 2012-08-09 ENCOUNTER — Ambulatory Visit (HOSPITAL_BASED_OUTPATIENT_CLINIC_OR_DEPARTMENT_OTHER)
Admission: RE | Admit: 2012-08-09 | Discharge: 2012-08-09 | Disposition: A | Discharge status: Home / Self Care | Payer: 59 | Source: Ambulatory Visit | Attending: Orthopaedic Surgery | Admitting: Orthopaedic Surgery

## 2012-08-09 ENCOUNTER — Encounter (HOSPITAL_BASED_OUTPATIENT_CLINIC_OR_DEPARTMENT_OTHER): Payer: Self-pay | Admitting: *Deleted

## 2012-08-09 ENCOUNTER — Ambulatory Visit (HOSPITAL_BASED_OUTPATIENT_CLINIC_OR_DEPARTMENT_OTHER): Payer: 59 | Admitting: Anesthesiology

## 2012-08-09 ENCOUNTER — Encounter (HOSPITAL_BASED_OUTPATIENT_CLINIC_OR_DEPARTMENT_OTHER): Payer: Self-pay | Admitting: Anesthesiology

## 2012-08-09 DIAGNOSIS — M2240 Chondromalacia patellae, unspecified knee: Secondary | ICD-10-CM | POA: Diagnosis not present

## 2012-08-09 DIAGNOSIS — E039 Hypothyroidism, unspecified: Secondary | ICD-10-CM | POA: Diagnosis not present

## 2012-08-09 DIAGNOSIS — F319 Bipolar disorder, unspecified: Secondary | ICD-10-CM | POA: Diagnosis not present

## 2012-08-09 DIAGNOSIS — M2340 Loose body in knee, unspecified knee: Secondary | ICD-10-CM | POA: Insufficient documentation

## 2012-08-09 DIAGNOSIS — F411 Generalized anxiety disorder: Secondary | ICD-10-CM | POA: Diagnosis not present

## 2012-08-09 HISTORY — PX: KNEE ARTHROSCOPY: SHX127

## 2012-08-09 HISTORY — DX: Anxiety disorder, unspecified: F41.9

## 2012-08-09 HISTORY — DX: Depression, unspecified: F32.A

## 2012-08-09 HISTORY — DX: Major depressive disorder, single episode, unspecified: F32.9

## 2012-08-09 HISTORY — DX: Hypothyroidism, unspecified: E03.9

## 2012-08-09 HISTORY — DX: Gastro-esophageal reflux disease without esophagitis: K21.9

## 2012-08-09 LAB — POCT HEMOGLOBIN-HEMACUE
Hemoglobin: 10.2 g/dL — ABNORMAL LOW (ref 12.0–15.0)
Hemoglobin: 9.8 g/dL — ABNORMAL LOW (ref 12.0–15.0)

## 2012-08-09 SURGERY — ARTHROSCOPY, KNEE
Anesthesia: General | Site: Knee | Laterality: Left | Wound class: Clean

## 2012-08-09 MED ORDER — SODIUM CHLORIDE 0.9 % IR SOLN
Status: DC | PRN
  Administered 2012-08-09: 3000 mL

## 2012-08-09 MED ORDER — FENTANYL CITRATE 0.05 MG/ML IJ SOLN: 50.0000 ug | INTRAMUSCULAR | Status: DC | PRN

## 2012-08-09 MED ORDER — FENTANYL CITRATE 0.05 MG/ML IJ SOLN
INTRAMUSCULAR | Status: DC | PRN
  Administered 2012-08-09 (×2): 50 ug via INTRAVENOUS

## 2012-08-09 MED ORDER — MIDAZOLAM HCL 2 MG/2ML IJ SOLN: 1.0000 mg | INTRAMUSCULAR | Status: DC | PRN

## 2012-08-09 MED ORDER — CHLORHEXIDINE GLUCONATE 4 % EX LIQD: 60.0000 mL | Freq: Once | CUTANEOUS | Status: DC

## 2012-08-09 MED ORDER — HYDROMORPHONE HCL PF 1 MG/ML IJ SOLN: 0.2500 mg | INTRAMUSCULAR | Status: DC | PRN

## 2012-08-09 MED ORDER — METOCLOPRAMIDE HCL 5 MG/ML IJ SOLN: 10.0000 mg | Freq: Once | INTRAMUSCULAR | Status: DC | PRN

## 2012-08-09 MED ORDER — LACTATED RINGERS IV SOLN: INTRAVENOUS | Status: DC

## 2012-08-09 MED ORDER — LIDOCAINE HCL (CARDIAC) 20 MG/ML IV SOLN
INTRAVENOUS | Status: DC | PRN
  Administered 2012-08-09: 50 mg via INTRAVENOUS

## 2012-08-09 MED ORDER — ONDANSETRON HCL 4 MG/2ML IJ SOLN
INTRAMUSCULAR | Status: DC | PRN
  Administered 2012-08-09: 4 mg via INTRAVENOUS

## 2012-08-09 MED ORDER — VANCOMYCIN HCL IN DEXTROSE 1-5 GM/200ML-% IV SOLN: 1000.0000 mg | INTRAVENOUS | Status: DC

## 2012-08-09 MED ORDER — OXYCODONE HCL 5 MG PO TABS: 5.0000 mg | ORAL_TABLET | Freq: Once | ORAL | Status: DC | PRN

## 2012-08-09 MED ORDER — OXYCODONE HCL 5 MG/5ML PO SOLN: 5.0000 mg | Freq: Once | ORAL | Status: DC | PRN

## 2012-08-09 MED ORDER — PROPOFOL 10 MG/ML IV BOLUS
INTRAVENOUS | Status: DC | PRN
  Administered 2012-08-09: 200 mg via INTRAVENOUS

## 2012-08-09 MED ORDER — DEXAMETHASONE SODIUM PHOSPHATE 4 MG/ML IJ SOLN
INTRAMUSCULAR | Status: DC | PRN
  Administered 2012-08-09: 10 mg via INTRAVENOUS

## 2012-08-09 MED ORDER — BUPIVACAINE HCL (PF) 0.5 % IJ SOLN
INTRAMUSCULAR | Status: DC | PRN
  Administered 2012-08-09: 20 mL

## 2012-08-09 MED ORDER — LACTATED RINGERS IV SOLN
INTRAVENOUS | Status: DC
  Administered 2012-08-09: 10:00:00 via INTRAVENOUS

## 2012-08-09 MED ORDER — PENTAZOCINE-NALOXONE 50-0.5 MG PO TABS: 1.0000 | ORAL_TABLET | ORAL | Status: DC | PRN

## 2012-08-09 SURGICAL SUPPLY — 38 items
BANDAGE ELASTIC 6 VELCRO ST LF (GAUZE/BANDAGES/DRESSINGS) ×2 IMPLANT
BANDAGE GAUZE ELAST BULKY 4 IN (GAUZE/BANDAGES/DRESSINGS) ×2 IMPLANT
BLADE CUDA 5.5 (BLADE) IMPLANT
BLADE GREAT WHITE 4.2 (BLADE) ×2 IMPLANT
CANISTER OMNI JUG 16 LITER (MISCELLANEOUS) IMPLANT
CANISTER SUCTION 2500CC (MISCELLANEOUS) IMPLANT
DRAPE ARTHROSCOPY W/POUCH 114 (DRAPES) ×2 IMPLANT
DRAPE U-SHAPE 47X51 STRL (DRAPES) ×2 IMPLANT
DRSG EMULSION OIL 3X3 NADH (GAUZE/BANDAGES/DRESSINGS) ×2 IMPLANT
DURAPREP 26ML APPLICATOR (WOUND CARE) ×2 IMPLANT
ELECT MENISCUS 165MM 90D (ELECTRODE) IMPLANT
ELECT REM PT RETURN 9FT ADLT (ELECTROSURGICAL)
ELECTRODE REM PT RTRN 9FT ADLT (ELECTROSURGICAL) IMPLANT
GLOVE BIO SURGEON STRL SZ8.5 (GLOVE) ×2 IMPLANT
GLOVE BIOGEL PI IND STRL 8 (GLOVE) ×1 IMPLANT
GLOVE BIOGEL PI IND STRL 8.5 (GLOVE) ×1 IMPLANT
GLOVE BIOGEL PI INDICATOR 8 (GLOVE) ×1
GLOVE BIOGEL PI INDICATOR 8.5 (GLOVE) ×1
GLOVE ECLIPSE 7.0 STRL STRAW (GLOVE) ×1 IMPLANT
GLOVE SS BIOGEL STRL SZ 8 (GLOVE) ×1 IMPLANT
GLOVE SUPERSENSE BIOGEL SZ 8 (GLOVE) ×1
GOWN PREVENTION PLUS XLARGE (GOWN DISPOSABLE) ×2 IMPLANT
GOWN PREVENTION PLUS XXLARGE (GOWN DISPOSABLE) ×3 IMPLANT
IV NS IRRIG 3000ML ARTHROMATIC (IV SOLUTION) ×2 IMPLANT
KNEE WRAP E Z 3 GEL PACK (MISCELLANEOUS) ×2 IMPLANT
PACK ARTHROSCOPY DSU (CUSTOM PROCEDURE TRAY) ×2 IMPLANT
PACK BASIN DAY SURGERY FS (CUSTOM PROCEDURE TRAY) ×2 IMPLANT
PENCIL BUTTON HOLSTER BLD 10FT (ELECTRODE) IMPLANT
SET ARTHROSCOPY TUBING (MISCELLANEOUS) ×2
SET ARTHROSCOPY TUBING LN (MISCELLANEOUS) ×1 IMPLANT
SHEET MEDIUM DRAPE 40X70 STRL (DRAPES) ×2 IMPLANT
SPONGE GAUZE 4X4 12PLY (GAUZE/BANDAGES/DRESSINGS) ×2 IMPLANT
SYR 3ML 18GX1 1/2 (SYRINGE) IMPLANT
TOWEL OR 17X24 6PK STRL BLUE (TOWEL DISPOSABLE) ×2 IMPLANT
TOWEL OR NON WOVEN STRL DISP B (DISPOSABLE) ×2 IMPLANT
WAND 30 DEG SABER W/CORD (SURGICAL WAND) IMPLANT
WAND STAR VAC 90 (SURGICAL WAND) IMPLANT
WATER STERILE IRR 1000ML POUR (IV SOLUTION) ×2 IMPLANT

## 2012-08-09 NOTE — Anesthesia Preprocedure Evaluation (Signed)
Anesthesia Evaluation  Patient identified by MRN, date of birth, ID band Patient awake    Reviewed: Allergy & Precautions, H&P , NPO status , Patient's Chart, lab work & pertinent test results, reviewed documented beta blocker date and time   Airway Mallampati: II TM Distance: >3 FB Neck ROM: full    Dental   Pulmonary neg pulmonary ROS,  breath sounds clear to auscultation        Cardiovascular negative cardio ROS  Rhythm:regular     Neuro/Psych PSYCHIATRIC DISORDERS Anxiety Depression  Neuromuscular disease    GI/Hepatic Neg liver ROS, GERD-  Medicated and Controlled,  Endo/Other  Hypothyroidism   Renal/GU negative Renal ROS  negative genitourinary   Musculoskeletal   Abdominal   Peds  Hematology negative hematology ROS (+)   Anesthesia Other Findings See surgeon's H&P   Reproductive/Obstetrics negative OB ROS                           Anesthesia Physical Anesthesia Plan  ASA: II  Anesthesia Plan: General   Post-op Pain Management:    Induction: Intravenous  Airway Management Planned: LMA  Additional Equipment:   Intra-op Plan:   Post-operative Plan:   Informed Consent: I have reviewed the patients History and Physical, chart, labs and discussed the procedure including the risks, benefits and alternatives for the proposed anesthesia with the patient or authorized representative who has indicated his/her understanding and acceptance.   Dental Advisory Given  Plan Discussed with: CRNA and Surgeon  Anesthesia Plan Comments:         Anesthesia Quick Evaluation

## 2012-08-09 NOTE — Anesthesia Procedure Notes (Signed)
Procedure Name: LMA Insertion Date/Time: 08/09/2012 10:10 AM Performed by: Zenia Resides D Pre-anesthesia Checklist: Patient identified, Emergency Drugs available, Suction available and Patient being monitored Patient Re-evaluated:Patient Re-evaluated prior to inductionOxygen Delivery Method: Circle System Utilized Preoxygenation: Pre-oxygenation with 100% oxygen Intubation Type: IV induction Ventilation: Mask ventilation without difficulty LMA: LMA inserted LMA Size: 4.0 Number of attempts: 1 Airway Equipment and Method: bite block Placement Confirmation: positive ETCO2 Tube secured with: Tape Dental Injury: Teeth and Oropharynx as per pre-operative assessment

## 2012-08-09 NOTE — Interval H&P Note (Signed)
History and Physical Interval Note:  08/09/2012 9:52 AM  Theresa Clay  has presented today for surgery, with the diagnosis of Left Knee Chondromalasia Patella  The various methods of treatment have been discussed with the patient and family. After consideration of risks, benefits and other options for treatment, the patient has consented to  Procedure(s): ARTHROSCOPY LEFT KNEE (Left) as a surgical intervention .  The patient's history has been reviewed, patient examined, no change in status, stable for surgery.  I have reviewed the patient's chart and labs.  Questions were answered to the patient's satisfaction.     Bettylou Frew G

## 2012-08-09 NOTE — Op Note (Signed)
#  [id number] 

## 2012-08-09 NOTE — Transfer of Care (Signed)
Immediate Anesthesia Transfer of Care Note  Patient: Theresa Clay  Procedure(s) Performed: Procedure(s): ARTHROSCOPY LEFT KNEE chondroplasty (Left)  Patient Location: PACU  Anesthesia Type:General  Level of Consciousness: awake, alert  and oriented  Airway & Oxygen Therapy: Patient Spontanous Breathing and Patient connected to face mask oxygen  Post-op Assessment: Report given to PACU RN and Post -op Vital signs reviewed and stable  Post vital signs: Reviewed and stable  Complications: No apparent anesthesia complications

## 2012-08-09 NOTE — Anesthesia Postprocedure Evaluation (Signed)
Anesthesia Post Note  Patient: Theresa Clay  Procedure(s) Performed: Procedure(s) (LRB): ARTHROSCOPY LEFT KNEE chondroplasty (Left)  Anesthesia type: General  Patient location: PACU  Post pain: Pain level controlled  Post assessment: Patient's Cardiovascular Status Stable  Last Vitals:  Filed Vitals:   08/09/12 1130  BP: 115/57  Pulse: 67  Temp: 36.4 C  Resp: 16    Post vital signs: Reviewed and stable  Level of consciousness: alert  Complications: No apparent anesthesia complications

## 2012-08-10 ENCOUNTER — Encounter (HOSPITAL_BASED_OUTPATIENT_CLINIC_OR_DEPARTMENT_OTHER): Payer: Self-pay | Admitting: Orthopaedic Surgery

## 2012-08-10 NOTE — Op Note (Signed)
Theresa Clay, Theresa Clay NO.:  [id number]  MEDICAL RECORD NO.:  [id number]  LOCATION:                               FACILITY:  [facility]  PHYSICIAN:  Lubertha Basque. Nancylee Gaines, M.D.DATE OF BIRTH:  May 08, 1956  DATE OF PROCEDURE:  08/09/2012 DATE OF DISCHARGE:  08/09/2012                              OPERATIVE REPORT   PREOPERATIVE DIAGNOSES: 1. Left knee chondromalacia patella. 2. Left knee loose bodies.  POSTOPERATIVE DIAGNOSES: 1. Left knee chondromalacia patella. 2. Left knee loose bodies.  PROCEDURES: 1. Left knee abrasion chondroplasty, patellofemoral. 2. Left knee removal loose body.  ANESTHESIA:  General.  ATTENDING SURGEON:  Lubertha Basque. Jerl Santos, M.D.  ASSISTANT:  Lindwood Qua, P.A.  INDICATION FOR PROCEDURE:  The patient is a 57 year old woman who is about 9 months from medial patellofemoral ligament reconstruction.  This has stabilized her patella and it no longer comes out of place, but she has developed some recurrent catching and popping and mechanical symptoms.  This has persisted despite conservative measures.  She is offered a repeat arthroscopy.  Informed operative consent was obtained after discussion of possible complications including reaction to anesthesia and infection.  SUMMARY OF FINDINGS AND PROCEDURE:  Under general anesthesia, an arthroscopy of the left knee was performed through 2 old inferior portals.  Suprapatellar pouch was benign except for some cartilaginous loose bodies.  The patellofemoral joint did track in a relatively lateral position, but even with pressure it could not be dislocated. She had some loose flaps of articular cartilage patellofemoral and a chondroplasty was done along with abrasion to bleeding bone and some small areas.  The inter trochlear groove appeared relatively benign though shallow.  The medial compartment showed no evidence of meniscal or articular cartilage injury.  The lateral compartment was  similar. The ACL was intact.  There were some small cartilaginous loose bodies centrally, which were also removed.  She was discharged home the same day.  DESCRIPTION OF PROCEDURE:  The patient was taken to the operating suite where general anesthetic was applied without difficulty.  She was positioned supine and prepped and draped in normal sterile fashion. After administration of IV clindamycin, an arthroscopy of the left knee was performed through a total of 2 portals.  Findings were as noted above and procedure consisted of the abrasion chondroplasty on the undersurface of the patella along with removal of small cartilaginous loose bodies.  She was irrigated.  We placed Marcaine with epinephrine and morphine in the joint.  Adaptic was placed over the portals followed by dry gauze and loose Ace wrap.  Estimated blood loss and intraoperative fluids can be obtained from anesthesia records.  DISPOSITION:  The patient was extubated in the operating room and taken to the recovery room in stable condition.  Plans were for her to go home same-day and follow up in the office closely.  I will contact her by phone tonight.     Lubertha Basque Jerl Santos, M.D.     PGD/MEDQ  D:  08/09/2012  T:  08/10/2012  Job:  [id number]

## 2012-08-18 DIAGNOSIS — F330 Major depressive disorder, recurrent, mild: Secondary | ICD-10-CM | POA: Diagnosis not present

## 2012-08-30 DIAGNOSIS — F3189 Other bipolar disorder: Secondary | ICD-10-CM | POA: Diagnosis not present

## 2012-08-30 DIAGNOSIS — M2240 Chondromalacia patellae, unspecified knee: Secondary | ICD-10-CM | POA: Diagnosis not present

## 2012-09-12 DIAGNOSIS — M2240 Chondromalacia patellae, unspecified knee: Secondary | ICD-10-CM | POA: Diagnosis not present

## 2012-09-14 DIAGNOSIS — F3189 Other bipolar disorder: Secondary | ICD-10-CM | POA: Diagnosis not present

## 2012-09-15 DIAGNOSIS — M2240 Chondromalacia patellae, unspecified knee: Secondary | ICD-10-CM | POA: Diagnosis not present

## 2012-09-20 DIAGNOSIS — M2240 Chondromalacia patellae, unspecified knee: Secondary | ICD-10-CM | POA: Diagnosis not present

## 2012-09-22 DIAGNOSIS — M2240 Chondromalacia patellae, unspecified knee: Secondary | ICD-10-CM | POA: Diagnosis not present

## 2012-09-26 DIAGNOSIS — M2240 Chondromalacia patellae, unspecified knee: Secondary | ICD-10-CM | POA: Diagnosis not present

## 2012-09-27 DIAGNOSIS — F330 Major depressive disorder, recurrent, mild: Secondary | ICD-10-CM | POA: Diagnosis not present

## 2012-10-05 DIAGNOSIS — E538 Deficiency of other specified B group vitamins: Secondary | ICD-10-CM | POA: Diagnosis not present

## 2012-10-05 DIAGNOSIS — IMO0001 Reserved for inherently not codable concepts without codable children: Secondary | ICD-10-CM | POA: Diagnosis not present

## 2012-10-05 DIAGNOSIS — F39 Unspecified mood [affective] disorder: Secondary | ICD-10-CM | POA: Diagnosis not present

## 2012-10-05 DIAGNOSIS — E669 Obesity, unspecified: Secondary | ICD-10-CM | POA: Diagnosis not present

## 2012-10-05 DIAGNOSIS — Z79899 Other long term (current) drug therapy: Secondary | ICD-10-CM | POA: Diagnosis not present

## 2012-10-05 DIAGNOSIS — R635 Abnormal weight gain: Secondary | ICD-10-CM | POA: Diagnosis not present

## 2012-10-05 DIAGNOSIS — E7800 Pure hypercholesterolemia, unspecified: Secondary | ICD-10-CM | POA: Diagnosis not present

## 2012-10-12 DIAGNOSIS — F330 Major depressive disorder, recurrent, mild: Secondary | ICD-10-CM | POA: Diagnosis not present

## 2012-10-26 DIAGNOSIS — D509 Iron deficiency anemia, unspecified: Secondary | ICD-10-CM | POA: Diagnosis not present

## 2012-11-09 DIAGNOSIS — F330 Major depressive disorder, recurrent, mild: Secondary | ICD-10-CM | POA: Diagnosis not present

## 2012-11-11 DIAGNOSIS — E538 Deficiency of other specified B group vitamins: Secondary | ICD-10-CM | POA: Diagnosis not present

## 2012-11-11 DIAGNOSIS — R5381 Other malaise: Secondary | ICD-10-CM | POA: Diagnosis not present

## 2012-11-11 DIAGNOSIS — F330 Major depressive disorder, recurrent, mild: Secondary | ICD-10-CM | POA: Diagnosis not present

## 2012-11-11 DIAGNOSIS — D649 Anemia, unspecified: Secondary | ICD-10-CM | POA: Diagnosis not present

## 2012-11-25 ENCOUNTER — Other Ambulatory Visit: Payer: Self-pay | Admitting: Gastroenterology

## 2012-11-25 DIAGNOSIS — D126 Benign neoplasm of colon, unspecified: Secondary | ICD-10-CM | POA: Diagnosis not present

## 2012-12-08 DIAGNOSIS — R5383 Other fatigue: Secondary | ICD-10-CM | POA: Diagnosis not present

## 2012-12-08 DIAGNOSIS — E538 Deficiency of other specified B group vitamins: Secondary | ICD-10-CM | POA: Diagnosis not present

## 2012-12-08 DIAGNOSIS — E038 Other specified hypothyroidism: Secondary | ICD-10-CM | POA: Diagnosis not present

## 2012-12-08 DIAGNOSIS — D509 Iron deficiency anemia, unspecified: Secondary | ICD-10-CM | POA: Diagnosis not present

## 2012-12-08 DIAGNOSIS — G4710 Hypersomnia, unspecified: Secondary | ICD-10-CM | POA: Diagnosis not present

## 2012-12-08 DIAGNOSIS — R5381 Other malaise: Secondary | ICD-10-CM | POA: Diagnosis not present

## 2012-12-09 ENCOUNTER — Encounter: Payer: Self-pay | Admitting: Neurology

## 2012-12-09 ENCOUNTER — Ambulatory Visit (INDEPENDENT_AMBULATORY_CARE_PROVIDER_SITE_OTHER): Payer: 59 | Admitting: Neurology

## 2012-12-09 ENCOUNTER — Ambulatory Visit (INDEPENDENT_AMBULATORY_CARE_PROVIDER_SITE_OTHER): Payer: 59 | Admitting: *Deleted

## 2012-12-09 VITALS — BP 113/76 | HR 68 | Resp 18 | Ht 62.5 in | Wt 174.0 lb

## 2012-12-09 DIAGNOSIS — G4713 Recurrent hypersomnia: Secondary | ICD-10-CM

## 2012-12-09 DIAGNOSIS — F3110 Bipolar disorder, current episode manic without psychotic features, unspecified: Secondary | ICD-10-CM

## 2012-12-09 DIAGNOSIS — R0609 Other forms of dyspnea: Secondary | ICD-10-CM

## 2012-12-09 DIAGNOSIS — F19982 Other psychoactive substance use, unspecified with psychoactive substance-induced sleep disorder: Secondary | ICD-10-CM | POA: Insufficient documentation

## 2012-12-09 DIAGNOSIS — R0683 Snoring: Secondary | ICD-10-CM

## 2012-12-09 DIAGNOSIS — R0989 Other specified symptoms and signs involving the circulatory and respiratory systems: Secondary | ICD-10-CM

## 2012-12-09 NOTE — Patient Instructions (Signed)
Driving and Equipment Restrictions Some medical problems make it dangerous to drive, ride a bike, or use machines. Some of these problems are:  A hard blow to the head (concussion).  Passing out (fainting).  Twitching and shaking (seizures).  Low blood sugar.  Taking medicine to help you relax (sedatives).  Taking pain medicines.  Wearing an eye patch.  Wearing splints. This can make it hard to use parts of your body that you need to drive safely. HOME CARE   Do not drive until your doctor says it is okay.  Do not use machines until your doctor says it is okay. You may need a form signed by your doctor (medical release) before you can drive again. You may also need this form before you do other tasks where you need to be fully alert. MAKE SURE YOU:  Understand these instructions.  Will watch your condition.  Will get help right away if you are not doing well or get worse. Document Released: 06/25/2004 Document Revised: 08/10/2011 Document Reviewed: 09/25/2009 [facility] Patient Information 2014 [place], [place]. Hypersomnia Hypersomnia usually brings recurrent episodes of excessive daytime sleepiness or prolonged nighttime sleep. It is different than feeling tired due to lack of or interrupted sleep at night. People with hypersomnia are compelled to nap repeatedly during the day. This is often at inappropriate times such as:  At work.  During a meal.  In conversation. These daytime naps usually provide no relief. This disorder typically affects adolescents and young adults. CAUSES  This condition may be caused by:  Another sleep disorder (such as narcolepsy or sleep apnea).  Dysfunction of the autonomic nervous system.  Drug or alcohol abuse.  A physical problem, such as:  A tumor.  Head trauma. This is damage caused by an accident.  Injury to the central nervous system.  Certain medications, or medicine withdrawal.  Medical conditions may contribute to the  disorder, including:  Multiple sclerosis.  Depression.  Encephalitis.  Epilepsy.  Obesity.  Some people appear to have a genetic predisposition to this disorder. In others, there is no known cause. SYMPTOMS   Patients often have difficulty waking from a long sleep. They may feel dazed or confused.  Other symptoms may include:  Anxiety.  Increased irritation (inflammation).  Decreased energy.  Restlessness.  Slow thinking.  Slow speech.  Loss of appetite.  Hallucinations.  Memory difficulty.  Tremors, Tics.  Some patients lose the ability to function in family, social, occupational, or other settings. TREATMENT  Treatment is symptomatic in nature. Stimulants and other drugs may be used to treat this disorder. Changes in behavior may help. For example, avoid night work and social activities that delay bed time. Changes in diet may offer some relief. Patients should avoid alcohol and caffeine. PROGNOSIS  The likely outcome (prognosis) for persons with hypersomnia depends on the cause of the disorder. The disorder itself is not life threatening. But it can have serious consequences. For example, automobile accidents can be caused by falling asleep while driving. The attacks usually continue indefinitely. Document Released: 05/08/2002 Document Revised: 08/10/2011 Document Reviewed: 04/11/2008 [facility] Patient Information 2014 [place], [place].

## 2012-12-09 NOTE — Progress Notes (Signed)
[facility]  Provider:  Dr Pegge Cumberledge Referring Provider: Elie Confer, * Primary Care Physician:  Elie Confer, MD  Chief Complaint  Patient presents with  . New Evaluation    [place],daytime sleepiness, and fatigue, rm 11    HPI:  Theresa Clay is a 57 y.o. female here as a referral from Dr. Gerri Spore for the daytime sleepiness evaluation.  Mrs. Primm endorses today the Epworth sleepiness score at 19 points and the fatigue severity score at 61 points, both highly elevated. He reports that she feels completely exhausted throughout the day and often sleeps in daytime, at night she is using trazodone to initiate sleep and maintaining sleep. She reports that in 2013 she felt much less daytime sleepy that she would occasionally take a nap in daytime but didn't have the overwhelming urge to sleep all the time , which has  over the last 5-6 months developed. The patient stated that in February for 4 or 5 days she was manic and that this seems to have also been correlated with the onset of excessive daytime sleepiness. Dr. Donell Beers is her  psychiatrist and medications were adjusted with him as of spring of this year. Again she feels that she has been sleeping all night but lacks the feeling of being restored. She  sleeps several hours in daytime.  The patient usually goes to bed between 10 and 11, she takes her trazodone at about 11 PM. She needs about 20-20 minutes to fall asleep sometimes less. She can sleep until she has a mid night bathroom break normally a single 1. She can sleep into the morning hours and needs an alarm she does not wake up spontaneously at a certain time she reports.  The patient reports sometimes dreaming sometimes vividly,  but not necessarily nightmarish or  with hallucinatory components . She had no sleep paralysis. She reports no  hypnopompic hallucinations,, no dream intrusion.  The patient has asked her son not to drive for as  she feels unsafe. She had yesterday morning an appointment with her primary care physician , when she returned home she was still sleepy and   needed go back to bed and rest.  She feels that there is no limits to the hours she can nap, she is not refreshed after a nap-- on the contrary.   Tonsillectomy, no braces,  no trauma to head or neck, jaw.Marland Kitchen      Her was medication was like to order a TURP a bedtime dose of trazodone was decreased to 200 mg a few months ago from previously 300 mg. She has been on the same doors of lithium. She is taking vitamin B12 shots. He was diagnosed with anemia and just underwent endoscopy and colonoscopy to see if any GI bleeds but the cost. This returned negative.  The patient's husband reports that she snores,  something she was not aware of until she asked him.   The patient has mild retrognathia and she has an elevated body mass index but no weight gain- actually Dr. Gerri Spore reports that her  weight has been stable for the last 2 years also.      The patient has a past medical history of breast cancer, hysterectomy, gastric bypass surgery in 2003, tonsillectomy 2010 and 2 knee surgeries and 2013 and 2014, she also has endorsed anxiety and depression in the frame of a bipolar diagnosis. She states that she feels that she sleeps too a much but he at that she doesn't get  enough sleep to feel refreshed. She seems constantly to suffer from decreased energy she also endorsed that she has lost interest in several activities and that she sometimes has racing thoughts at night keeping her from rest. Pain. She has reportedly snort and she has frequently headaches. She also reports a tremor, induced by medications .  Review of Systems: Out of a complete 14 system review, the patient complains of only the following symptoms, and all other reviewed systems are negative.  EDS 19 points. FSS over 50 points, Beck's depression score 7 points- but she is tearful.   History    Social History  . Marital Status: Married    Spouse Name: N/A    Number of Children: 1  . Years of Education: college   Occupational History  . disabled     for [employer]   Social History Main Topics  . Smoking status: Current Every Day Smoker -- 0.50 packs/day  . Smokeless tobacco: Not on file     Comment: 5-6 cigarettes daily  . Alcohol Use: No  . Drug Use: No  . Sexually Active: Yes    Birth Control/ Protection: Surgical   Other Topics Concern  . Not on file   Social History Narrative  . No narrative on file    Family History  Problem Relation Age of Onset  . Breast cancer Mother 27  . Melanoma Mother   . Dementia Father   . Cancer - Prostate Maternal Grandfather     Past Medical History  Diagnosis Date  . Hx of radiation therapy 04/04/08-05/17/08    left breast, tumor bed  . Bipolar 1 disorder   . Fibromyalgia   . Osteoarthritis   . Hypothyroidism   . Depression   . Anxiety   . GERD (gastroesophageal reflux disease)   . Hyperlipemia   . Esophageal reflux   . Breast cancer 12/13/07    left , ER/PR +  . 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 Knee surgery  10/2011    reconstruction  . Gastric bypass    . Tonsillectomy    . Cholecystectomy    . Total abdominal hysterectomy w/ bilateral salpingoophorectomy    . Knee arthroscopy Left 08/09/2012    Procedure: ARTHROSCOPY LEFT KNEE chondroplasty;  Surgeon: Velna Ochs, MD;  Location: [facility];  Service: Orthopedics;  Laterality: Left;    Current Outpatient Prescriptions  Medication Sig Dispense Refill  . albuterol (PROVENTIL HFA;VENTOLIN HFA) 108 (90 BASE) MCG/ACT inhaler Inhale 2 puffs into the lungs every 4 (four) hours as needed for wheezing. 2 puffs as needed      . CELEBREX 200 MG capsule 200 mg daily.       Marland Kitchen esomeprazole (NEXIUM) 40 MG capsule Take 40 mg by mouth daily before breakfast.      . IRON PO Take by mouth daily.      Marland Kitchen  lamoTRIgine (LAMICTAL) 150 MG tablet Take 150 mg by mouth daily. Pm dosage      . levothyroxine (SYNTHROID, LEVOTHROID) 50 MCG tablet 50 mcg daily. Take one tablet every morning on an empty stomach      . LIDODERM 5 %       . lithium carbonate 300 MG capsule 300 mg 3 (three) times daily. One tablet in am two tablets pm      . LORazepam (ATIVAN) 0.5 MG tablet 0.5 mg daily as needed.       Marland Kitchen  lurasidone (LATUDA) 80 MG TABS Take by mouth daily. One tablet  With food once daily      . Multiple Vitamin (MULTIVITAMIN) tablet Take 1 tablet by mouth daily.      Marland Kitchen omeprazole (PRILOSEC) 40 MG capsule 40 mg daily.       . pentazocine-naloxone (TALWIN NX) 50-0.5 MG per tablet Take 1 tablet by mouth 3 (three) times daily. As needed      . tiZANidine (ZANAFLEX) 4 MG capsule Take 4 mg by mouth daily. Take daily      . traZODone (DESYREL) 100 MG tablet 300 mg at bedtime.       . valACYclovir (VALTREX) 500 MG tablet Take 500 mg by mouth daily. One tablet every 12 hours as needed       No current facility-administered medications for this visit.    Allergies as of 12/09/2012 - Review Complete 12/09/2012  Allergen Reaction Noted  . Darvocet (propoxyphene-acetaminophen) Anaphylaxis 03/31/2012  . Penicillins Anaphylaxis 03/31/2012  . Codeine Other (See Comments) 08/03/2012  . Simvastatin  12/09/2012    Vitals: BP 113/76  Pulse 68  Resp 18  Ht 5' 2.5" (1.588 m)  Wt 174 lb (78.926 kg)  BMI 31.3 kg/m2 Last Weight:  Wt Readings from Last 1 Encounters:  12/09/12 174 lb (78.926 kg)   Last Height:   Ht Readings from Last 1 Encounters:  12/09/12 5' 2.5" (1.588 m)     Physical exam:  General: The patient is awake, alert and appears not in acute distress. The patient is well groomed. Head: Normocephalic, atraumatic. Neck is supple. Mallampati 3- uvula touched tongue ground , neck circumference:14.5 ,  Retroganthia,  BMI 34 . Very reddened upper airway-   Cardiovascular:  Regular rate and rhythm, without   murmurs or carotid bruit, and without distended neck veins. Respiratory: Lungs are clear to auscultation. Skin:  Without evidence of edema, but Floria Raveling' s in both palms.  Trunk: BMI is 34 - elevated and patient  has normal posture.  Neurologic exam : The patient is fatigued , oriented to place and time.  Memory subjective  described as intact. There is a normal attention span & concentration ability. Speech is fluent without  dysarthria, dysphonia or aphasia.  Mood and affect are slightly anxious, frustrated, but   Appropriate in her responses. Denies a harming herself or others.   Cranial nerves: Pupils are equal and briskly reactive to light. Funduscopic exam without  evidence of pallor or edema. Nystagmus -  Extraocular movements  in vertical and horizontal planes intact and without nystagmus.  Visual fields by finger perimetry are intact. Hearing to finger rub intact.  Facial sensation intact to fine touch. Facial motor strength is symmetric and tongue and uvula move midline.  Motor exam: Normal tone and normal muscle bulk and symmetric normal strength in all extremities.  Sensory:  Fine touch, pinprick and vibration were tested in all extremities. Proprioception is  normal.  Coordination: Rapid alternating movements in the fingers/hands is tested , Finger-to-nose maneuver without evidence of ataxia, dysmetria - mild tremor.  Gait and station: Patient walks without assistive device and is able and assisted stool climb up to the exam table. Strength within normal limits. Stance is stable .Tandem gait is unfragmented. Romberg testing is normal.  Deep tendon reflexes: in the  upper and lower extremities are symmetric and intact. Babinski maneuver response is  downgoing.   Assessment:  After physical and neurologic examination, review of laboratory studies, imaging, neurophysiology testing and pre-existing records,  assessment will be reviewed on the problem list.  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 She may have apnea, too. Snoring and weight , Mallampati :  all speak for upper airway restriction.  Very high Epworth without narcoleptic symptoms- no sleep paralysis, no hallucinations, and no refreshing power naps. No Cataplexy .    Plan:  Treatment plan and additional workup will be reviewed under Problem List.  apnea screening test - if possible HST.    Lithium levels and thyroid function , renal function followed by Dr Gerri Spore.

## 2012-12-13 DIAGNOSIS — F330 Major depressive disorder, recurrent, mild: Secondary | ICD-10-CM | POA: Diagnosis not present

## 2012-12-14 ENCOUNTER — Telehealth: Payer: Self-pay | Admitting: Hematology and Oncology

## 2012-12-14 NOTE — Telephone Encounter (Signed)
Pt scheduled to see Dr. Karel Jarvis 08/15 @ 10:30.  Referring Dr. Charlott Rakes Dx- I[facility] Welcome packet mailed.    Referring office is aware of NP appt.

## 2012-12-14 NOTE — Telephone Encounter (Signed)
C/D 12/14/12 for appt. 01/13/13

## 2012-12-20 ENCOUNTER — Encounter: Payer: Self-pay | Admitting: *Deleted

## 2012-12-20 ENCOUNTER — Telehealth: Payer: Self-pay | Admitting: *Deleted

## 2012-12-20 ENCOUNTER — Other Ambulatory Visit: Payer: Self-pay | Admitting: *Deleted

## 2012-12-20 DIAGNOSIS — F3110 Bipolar disorder, current episode manic without psychotic features, unspecified: Secondary | ICD-10-CM

## 2012-12-20 DIAGNOSIS — R0683 Snoring: Secondary | ICD-10-CM

## 2012-12-20 DIAGNOSIS — G4713 Recurrent hypersomnia: Secondary | ICD-10-CM

## 2012-12-20 NOTE — Sleep Study (Signed)
See media tab for full report  

## 2012-12-20 NOTE — Telephone Encounter (Signed)
Called patient to discuss home sleep study results from 12/09/2012.  Discussed findings, recommendations and follow up care.  Patient understood well.  She is meeting with Dr. Gerri Spore on 7/31 (her last visit before Dr. Salem Senate), and she has chosen to meet with Dr. Vickey Huger the following day 12/30/2012 in order to discuss her recommendations and what happens next.  We discussed daytime testing (she said Dr. Vickey Huger said she was sure she didn't have Narcolepsy), medication side effects, etc.)  Many of her questions are best addressed by the physician.

## 2012-12-20 NOTE — Sleep Study (Signed)
Patient arrives for HST instruction.  Patient is given written instructions, picture instructions, and a demonstration on how to use HST unit.  All questions / concerns were addressed by technologist.  Financial responsibility was explained.  Follow up information was given to patient regarding how results would be received.   This was done on DOS 12/09/2012

## 2012-12-29 ENCOUNTER — Institutional Professional Consult (permissible substitution): Payer: 59 | Admitting: Neurology

## 2012-12-29 DIAGNOSIS — D509 Iron deficiency anemia, unspecified: Secondary | ICD-10-CM | POA: Diagnosis not present

## 2012-12-29 DIAGNOSIS — R5381 Other malaise: Secondary | ICD-10-CM | POA: Diagnosis not present

## 2012-12-29 DIAGNOSIS — R5383 Other fatigue: Secondary | ICD-10-CM | POA: Diagnosis not present

## 2012-12-29 DIAGNOSIS — E559 Vitamin D deficiency, unspecified: Secondary | ICD-10-CM | POA: Diagnosis not present

## 2012-12-30 ENCOUNTER — Institutional Professional Consult (permissible substitution): Payer: 59 | Admitting: Neurology

## 2013-01-12 ENCOUNTER — Telehealth: Payer: Self-pay | Admitting: Hematology and Oncology

## 2013-01-12 DIAGNOSIS — F330 Major depressive disorder, recurrent, mild: Secondary | ICD-10-CM | POA: Diagnosis not present

## 2013-01-12 NOTE — Telephone Encounter (Signed)
S/W PT IN RE TO D/T CHANGE OF APPT 08/19 @ 8:30 W/DR. VICTOR REFERRING DR. Oswaldo Done SCHOOLER DX- IDA WELCOME PACKET MAILED.

## 2013-01-13 ENCOUNTER — Other Ambulatory Visit: Payer: Self-pay | Admitting: Hematology and Oncology

## 2013-01-13 ENCOUNTER — Ambulatory Visit: Payer: 59

## 2013-01-13 ENCOUNTER — Other Ambulatory Visit: Payer: 59 | Admitting: Lab

## 2013-01-13 DIAGNOSIS — F330 Major depressive disorder, recurrent, mild: Secondary | ICD-10-CM | POA: Diagnosis not present

## 2013-01-16 ENCOUNTER — Telehealth: Payer: Self-pay

## 2013-01-16 NOTE — Telephone Encounter (Signed)
C/D 01/16/13 for appt. 01/17/13 °

## 2013-01-17 ENCOUNTER — Telehealth: Payer: Self-pay | Admitting: *Deleted

## 2013-01-17 ENCOUNTER — Ambulatory Visit (HOSPITAL_BASED_OUTPATIENT_CLINIC_OR_DEPARTMENT_OTHER): Payer: 59 | Admitting: Lab

## 2013-01-17 ENCOUNTER — Ambulatory Visit: Payer: 59

## 2013-01-17 ENCOUNTER — Telehealth: Payer: Self-pay | Admitting: Hematology and Oncology

## 2013-01-17 ENCOUNTER — Ambulatory Visit (HOSPITAL_BASED_OUTPATIENT_CLINIC_OR_DEPARTMENT_OTHER): Payer: 59 | Admitting: Hematology and Oncology

## 2013-01-17 VITALS — BP 113/77 | HR 85 | Temp 97.2°F | Resp 18 | Ht 62.0 in | Wt 175.2 lb

## 2013-01-17 DIAGNOSIS — D649 Anemia, unspecified: Secondary | ICD-10-CM

## 2013-01-17 DIAGNOSIS — D509 Iron deficiency anemia, unspecified: Secondary | ICD-10-CM

## 2013-01-17 LAB — FERRITIN CHCC: Ferritin: 11 ng/ml (ref 9–269)

## 2013-01-17 LAB — COMPREHENSIVE METABOLIC PANEL (CC13)
ALT: 15 U/L (ref 0–55)
AST: 15 U/L (ref 5–34)
Albumin: 3.9 g/dL (ref 3.5–5.0)
Alkaline Phosphatase: 96 U/L (ref 40–150)
BUN: 8.8 mg/dL (ref 7.0–26.0)
CO2: 24 mEq/L (ref 22–29)
Calcium: 9.7 mg/dL (ref 8.4–10.4)
Chloride: 108 mEq/L (ref 98–109)
Creatinine: 1 mg/dL (ref 0.6–1.1)
Glucose: 98 mg/dl (ref 70–140)
Potassium: 4.4 mEq/L (ref 3.5–5.1)
Sodium: 140 mEq/L (ref 136–145)
Total Bilirubin: 0.46 mg/dL (ref 0.20–1.20)
Total Protein: 6.8 g/dL (ref 6.4–8.3)

## 2013-01-17 LAB — CBC & DIFF AND RETIC
BASO%: 0.5 % (ref 0.0–2.0)
Basophils Absolute: 0 10*3/uL (ref 0.0–0.1)
EOS%: 3.1 % (ref 0.0–7.0)
Eosinophils Absolute: 0.3 10*3/uL (ref 0.0–0.5)
HCT: 43.2 % (ref 34.8–46.6)
HGB: 13.5 g/dL (ref 11.6–15.9)
Immature Retic Fract: 6.3 % (ref 1.60–10.00)
LYMPH%: 21.9 % (ref 14.0–49.7)
MCH: 27.3 pg (ref 25.1–34.0)
MCHC: 31.3 g/dL — ABNORMAL LOW (ref 31.5–36.0)
MCV: 87.3 fL (ref 79.5–101.0)
MONO#: 0.4 10*3/uL (ref 0.1–0.9)
MONO%: 4.4 % (ref 0.0–14.0)
NEUT#: 5.7 10*3/uL (ref 1.5–6.5)
NEUT%: 70.1 % (ref 38.4–76.8)
Platelets: 268 10*3/uL (ref 145–400)
RBC: 4.95 10*6/uL (ref 3.70–5.45)
RDW: 15.5 % — ABNORMAL HIGH (ref 11.2–14.5)
Retic %: 1.07 % (ref 0.70–2.10)
Retic Ct Abs: 52.97 10*3/uL (ref 33.70–90.70)
WBC: 8.1 10*3/uL (ref 3.9–10.3)
lymph#: 1.8 10*3/uL (ref 0.9–3.3)

## 2013-01-17 LAB — IRON AND TIBC CHCC
%SAT: 36 % (ref 21–57)
Iron: 145 ug/dL — ABNORMAL HIGH (ref 41–142)
TIBC: 404 ug/dL (ref 236–444)
UIBC: 259 ug/dL (ref 120–384)

## 2013-01-17 MED ORDER — FERROUS FUMARATE 324 MG PO TABS: 1.0000 | ORAL_TABLET | Freq: Two times a day (BID) | ORAL | Status: DC

## 2013-01-17 NOTE — Telephone Encounter (Signed)
Gave pt appt for September MD only, and sent pt to lab today

## 2013-01-17 NOTE — Telephone Encounter (Signed)
Dr. Alecia Lemming reviewed pt's labs from today and instructs pt does not need to take new Iron tablets that he ordered today.  She can stay on the iron (ferrous gluconate) once daily she was taking.  She is not anemic.  She needs to f/u w/  PCP for routine CBC and iron levels.  No need for f/u w/ Hematologist at this time.   Pt verbalized understanding.  Office visit scheduled for Sept canceled.  Pt aware.

## 2013-02-03 DIAGNOSIS — F330 Major depressive disorder, recurrent, mild: Secondary | ICD-10-CM | POA: Diagnosis not present

## 2013-02-07 ENCOUNTER — Ambulatory Visit: Payer: 59

## 2013-02-09 NOTE — Progress Notes (Signed)
ID: Donnelly Angelica OB: [birth year]  MR#: [id number]  WUJ#:[id number]  [facility]  [phone] [phone]    INITIAL HEMATOLOGY CONSULTATION    Referral MD:   Elie Confer, MD  Reason for Referral: Iron deficiency anemia.   HISTORY OF PRESENT ILLNESS: The patient is a 57 y.o. Female who recently was found to have iron deficiency anemia.Marland KitchenShe was started on iron pills and was seen gastroenterologist on 10/26/2012. EGD and colonoscopy were done on 11/25/2012 which were unremarkable.  The patient was send to hematologist.  Patient reported that she feels tired and dizzy, blurry vision, muscle pain secondary to fibromyalgia, has headaches every day, nausea daily in am.  She has good appetite and gain some weight. Sometimes she has dyspnea. She complains on constipation which she handle with stool softener.The patient denied fever, chills, night sweats. She denied  double vision, nasal congestion, nasal discharge, hearing problems, odynophagia or dysphagia. No chest pain, palpitations,  cough, abdominal pain,  vomiting, diarrhea, hematochezia. The patient denied dysuria, nocturia, polyuria, hematuria, myalgia, numbness, tingling, psychiatric problems.  Review of Systems  Constitutional: Positive for malaise/fatigue. Negative for fever, chills, weight loss and diaphoresis.  HENT: Negative for hearing loss, ear pain, nosebleeds, congestion, sore throat, neck pain and tinnitus.   Eyes: Positive for blurred vision. Negative for double vision, photophobia and pain.  Respiratory: Positive for shortness of breath. Negative for cough, hemoptysis, sputum production and wheezing.   Cardiovascular: Negative for chest pain, palpitations, orthopnea, claudication, leg swelling and PND.  Gastrointestinal: Positive for nausea and constipation. Negative for vomiting, abdominal pain, diarrhea, blood in stool and melena.  Genitourinary: Negative for dysuria, urgency,  frequency, hematuria and flank pain.  Musculoskeletal: Positive for myalgias. Negative for back pain and joint pain.  Skin: Negative for itching and rash.  Neurological: Positive for dizziness and headaches. Negative for tingling, tremors, sensory change, speech change, focal weakness, seizures, loss of consciousness and weakness.  Endo/Heme/Allergies: Bruises/bleeds easily.  Psychiatric/Behavioral: Negative.  Negative for depression.    PAST MEDICAL HISTORY: Past Medical History  Diagnosis Date  . Hx of radiation therapy 04/04/08-05/17/08    left breast, tumor bed  . Bipolar 1 disorder   . Fibromyalgia   . Osteoarthritis   . Hypothyroidism   . Depression   . Anxiety   . GERD (gastroesophageal reflux disease)   . Hyperlipemia   . Esophageal reflux   . Breast cancer 12/13/07    left , ER/PR +  . 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 Knee surgery  10/2011    reconstruction  . Gastric bypass    . Tonsillectomy    . Cholecystectomy    . Total abdominal hysterectomy w/ bilateral salpingoophorectomy    . Knee arthroscopy Left 08/09/2012    Procedure: ARTHROSCOPY LEFT KNEE chondroplasty;  Surgeon: Velna Ochs, MD;  Location: [facility];  Service: Orthopedics;  Laterality: Left;    FAMILY HISTORY Family History  Problem Relation Age of Onset  . Breast cancer Mother 77  . Melanoma Mother   . Dementia Father   . Cancer - Prostate Maternal Grandfather     HEALTH MAINTENANCE: History  Substance Use Topics  . Smoking status: Current Every Day Smoker -- 0.50 packs/day  . Smokeless tobacco: Not on file     Comment: 5-6 cigarettes daily  . Alcohol Use: No    Allergies  Allergen Reactions  . Darvocet [  Propoxyphene-Acetaminophen] Anaphylaxis  . Penicillins Anaphylaxis  . Codeine Other (See Comments)    "double over in pain" per pt  . Simvastatin     myalgias    Current Outpatient  Prescriptions  Medication Sig Dispense Refill  . albuterol (PROVENTIL HFA;VENTOLIN HFA) 108 (90 BASE) MCG/ACT inhaler Inhale 2 puffs into the lungs every 4 (four) hours as needed for wheezing. 2 puffs as needed      . amphetamine-dextroamphetamine (ADDERALL) 10 MG tablet Take 10 mg by mouth 2 (two) times daily.      . CELEBREX 200 MG capsule 200 mg daily.       Marland Kitchen esomeprazole (NEXIUM) 40 MG capsule Take 40 mg by mouth daily before breakfast.      . lamoTRIgine (LAMICTAL) 150 MG tablet Take 150 mg by mouth daily. Pm dosage      . levothyroxine (SYNTHROID, LEVOTHROID) 50 MCG tablet 50 mcg daily. Take one tablet every morning on an empty stomach      . LIDODERM 5 %       . lithium carbonate 300 MG capsule 300 mg 3 (three) times daily. One tablet in am two tablets pm      . LORazepam (ATIVAN) 0.5 MG tablet 0.5 mg daily as needed.       . lurasidone (LATUDA) 80 MG TABS Take by mouth daily. One tablet  With food once daily      . Multiple Vitamin (MULTIVITAMIN) tablet Take 1 tablet by mouth daily.      Marland Kitchen omeprazole (PRILOSEC) 40 MG capsule 40 mg daily.       . pentazocine-naloxone (TALWIN NX) 50-0.5 MG per tablet Take 1 tablet by mouth 3 (three) times daily. As needed      . tiZANidine (ZANAFLEX) 4 MG capsule Take 4 mg by mouth daily. Take daily      . traZODone (DESYREL) 100 MG tablet 300 mg at bedtime.       . valACYclovir (VALTREX) 500 MG tablet Take 500 mg by mouth daily. One tablet every 12 hours as needed      . Ferrous Fumarate 324 MG TABS Take 1 tablet (106 mg of iron total) by mouth 2 (two) times daily.  60 each  3   No current facility-administered medications for this visit.    OBJECTIVE: Filed Vitals:   01/17/13 0847  BP: 113/77  Pulse: 85  Temp: 97.2 F (36.2 C)  Resp: 18     Body mass index is 32.04 kg/(m^2).    ECOG FS:0  PHYSICAL EXAMINATION:  HEENT: Sclerae anicteric.  Conjunctivae were pink. Pupils round and reactive bilaterally. Oral mucosa is moist without ulceration  or thrush. No occipital, submandibular, cervical, supraclavicular or axillar adenopathy. Lungs: clear to auscultation without wheezes. No rales or rhonchi. Heart: regular rate and rhythm. No murmur, gallop or rubs. Abdomen: soft, non tender. No guarding or rebound tenderness. Bowel sounds are present. No palpable hepatosplenomegaly. MSK: no focal spinal tenderness. Extremities: No clubbing or cyanosis.No calf tenderness to palpitation, no peripheral edema. The patient had grossly intact strength in upper and lower extremities. Skin exam was without ecchymosis, petechiae. Neuro: non-focal, alert and oriented to time, person and place, appropriate affect  LAB RESULTS:  CMP     Component Value Date/Time   NA 140 01/17/2013 0956   NA 140 03/15/2008 1900   K 4.4 01/17/2013 0956   K 3.5 03/15/2008 1900   CL 109 03/15/2008 1900   CO2 24 01/17/2013 0956   CO2 23 03/15/2008  1900   GLUCOSE 98 01/17/2013 0956   GLUCOSE 136* 03/15/2008 1900   BUN 8.8 01/17/2013 0956   BUN 5* 03/15/2008 1900   CREATININE 1.0 01/17/2013 0956   CREATININE 0.88 03/15/2008 1900   CALCIUM 9.7 01/17/2013 0956   CALCIUM 8.9 03/15/2008 1900   PROT 6.8 01/17/2013 0956   ALBUMIN 3.9 01/17/2013 0956   AST 15 01/17/2013 0956   ALT 15 01/17/2013 0956   ALKPHOS 96 01/17/2013 0956   BILITOT 0.46 01/17/2013 0956   GFRNONAA >60 03/15/2008 1900   GFRAA  Value: >60        The eGFR has been calculated using the MDRD equation. This calculation has not been validated in all clinical 03/15/2008 1900    I No results found for this basename: SPEP, UPEP,  kappa and lambda light chains    Lab Results  Component Value Date   WBC 8.1 01/17/2013   NEUTROABS 5.7 01/17/2013   HGB 13.5 01/17/2013   HCT 43.2 01/17/2013   MCV 87.3 01/17/2013   PLT 268 01/17/2013      Chemistry      Component Value Date/Time   NA 140 01/17/2013 0956   NA 140 03/15/2008 1900   K 4.4 01/17/2013 0956   K 3.5 03/15/2008 1900   CL 109 03/15/2008 1900   CO2 24  01/17/2013 0956   CO2 23 03/15/2008 1900   BUN 8.8 01/17/2013 0956   BUN 5* 03/15/2008 1900   CREATININE 1.0 01/17/2013 0956   CREATININE 0.88 03/15/2008 1900      Component Value Date/Time   CALCIUM 9.7 01/17/2013 0956   CALCIUM 8.9 03/15/2008 1900   ALKPHOS 96 01/17/2013 0956   AST 15 01/17/2013 0956   ALT 15 01/17/2013 0956   BILITOT 0.46 01/17/2013 0956      STUDIES: No results found.  ASSESSMENT AND PLAN: 1. Iron deficiency anemia. Patient tried to take ferrous sulfate but did not tolerate it.(had nausea and vomited). She was switch to ferrous gluconate and takes 2-3 tabs a week.. I will recommend to try ferrous fumarate 324 mg bid or qd. Will check CBC, Iron panel today and in 3 weeks. Consider to check for PNH. Follow up in 3 weeks.   Myra Rude, MD   01/17/2013 7:49 PM

## 2013-03-06 DIAGNOSIS — F330 Major depressive disorder, recurrent, mild: Secondary | ICD-10-CM | POA: Diagnosis not present

## 2013-03-13 DIAGNOSIS — F330 Major depressive disorder, recurrent, mild: Secondary | ICD-10-CM | POA: Diagnosis not present

## 2013-03-27 ENCOUNTER — Other Ambulatory Visit: Payer: Self-pay | Admitting: Radiation Oncology

## 2013-03-27 DIAGNOSIS — Z853 Personal history of malignant neoplasm of breast: Secondary | ICD-10-CM

## 2013-04-06 ENCOUNTER — Other Ambulatory Visit: Payer: Self-pay

## 2013-04-06 DIAGNOSIS — F330 Major depressive disorder, recurrent, mild: Secondary | ICD-10-CM | POA: Diagnosis not present

## 2013-04-19 DIAGNOSIS — R3989 Other symptoms and signs involving the genitourinary system: Secondary | ICD-10-CM | POA: Diagnosis not present

## 2013-04-19 DIAGNOSIS — S335XXA Sprain of ligaments of lumbar spine, initial encounter: Secondary | ICD-10-CM | POA: Diagnosis not present

## 2013-04-25 ENCOUNTER — Ambulatory Visit
Admission: RE | Admit: 2013-04-25 | Discharge: 2013-04-25 | Disposition: A | Discharge status: Home / Self Care | Payer: 59 | Source: Ambulatory Visit | Attending: Radiation Oncology | Admitting: Radiation Oncology

## 2013-04-25 DIAGNOSIS — Z853 Personal history of malignant neoplasm of breast: Secondary | ICD-10-CM

## 2013-05-03 DIAGNOSIS — D235 Other benign neoplasm of skin of trunk: Secondary | ICD-10-CM | POA: Diagnosis not present

## 2013-05-03 DIAGNOSIS — L909 Atrophic disorder of skin, unspecified: Secondary | ICD-10-CM | POA: Diagnosis not present

## 2013-05-03 DIAGNOSIS — L919 Hypertrophic disorder of the skin, unspecified: Secondary | ICD-10-CM | POA: Diagnosis not present

## 2013-05-03 DIAGNOSIS — L819 Disorder of pigmentation, unspecified: Secondary | ICD-10-CM | POA: Diagnosis not present

## 2013-05-04 ENCOUNTER — Encounter: Payer: Self-pay | Admitting: Radiation Oncology

## 2013-05-04 ENCOUNTER — Ambulatory Visit
Admission: RE | Admit: 2013-05-04 | Discharge: 2013-05-04 | Disposition: A | Discharge status: Home / Self Care | Payer: 59 | Source: Ambulatory Visit | Attending: Radiation Oncology | Admitting: Radiation Oncology

## 2013-05-04 VITALS — BP 96/64 | HR 76 | Temp 98.4°F | Ht 62.0 in | Wt 157.1 lb

## 2013-05-04 DIAGNOSIS — C50919 Malignant neoplasm of unspecified site of unspecified female breast: Secondary | ICD-10-CM | POA: Diagnosis not present

## 2013-05-04 DIAGNOSIS — C50912 Malignant neoplasm of unspecified site of left female breast: Secondary | ICD-10-CM

## 2013-05-04 NOTE — Progress Notes (Signed)
Department of Radiation Oncology  Phone:  [phone] Fax:        [phone]   Name: Theresa Clay MRN: [id number]  DOB: 11/05/1955  Date: 05/04/2013  Follow Up Visit Note  Diagnosis: DCIS of the left breast  Summary and Interval since last radiation: 5 years  Interval History: Theresa Clay presents today for routine followup.  She has done well. She continues to have tenderness over her left breast but also has some over her right axilla as well.. She is now on tamoxifen. She had negative mammograms in November. She is pleased with her cosmetic result.  Allergies:  Allergies  Allergen Reactions  . Darvocet [Propoxyphene-Acetaminophen] Anaphylaxis  . Penicillins Anaphylaxis  . Codeine Other (See Comments)    "double over in pain" per pt  . Simvastatin     myalgias    Medications:  Current Outpatient Prescriptions  Medication Sig Dispense Refill  . albuterol (PROVENTIL HFA;VENTOLIN HFA) 108 (90 BASE) MCG/ACT inhaler Inhale 2 puffs into the lungs every 4 (four) hours as needed for wheezing. 2 puffs as needed      . amphetamine-dextroamphetamine (ADDERALL) 10 MG tablet Take 10 mg by mouth 2 (two) times daily.      . CELEBREX 200 MG capsule 200 mg daily.       Marland Kitchen esomeprazole (NEXIUM) 40 MG capsule Take 40 mg by mouth daily before breakfast.      . lamoTRIgine (LAMICTAL) 150 MG tablet Take 150 mg by mouth daily. Pm dosage      . levothyroxine (SYNTHROID, LEVOTHROID) 50 MCG tablet 50 mcg daily. Take one tablet every morning on an empty stomach      . LIDODERM 5 %       . lithium carbonate 300 MG capsule 300 mg 3 (three) times daily. One tablet in am two tablets pm      . LORazepam (ATIVAN) 0.5 MG tablet 0.5 mg daily as needed.       . lurasidone (LATUDA) 80 MG TABS Take by mouth daily. One tablet  With food once daily      . Multiple Vitamin (MULTIVITAMIN) tablet Take 1 tablet by mouth daily.      Marland Kitchen omeprazole (PRILOSEC) 40 MG capsule 40 mg daily.       . pentazocine-naloxone  (TALWIN NX) 50-0.5 MG per tablet Take 1 tablet by mouth 3 (three) times daily. As needed      . tiZANidine (ZANAFLEX) 4 MG capsule Take 4 mg by mouth daily. Take daily      . traZODone (DESYREL) 100 MG tablet 300 mg at bedtime.       Marland Kitchen UNABLE TO FIND B12 injections one or twice weekly      . valACYclovir (VALTREX) 500 MG tablet Take 500 mg by mouth daily. One tablet every 12 hours as needed       No current facility-administered medications for this encounter.    Physical Exam:  Filed Vitals:   05/04/13 1514  BP: 96/64  Pulse: 76  Temp: 98.4 F (36.9 C)   the pocketing in the lateral aspect of the left breast has improved. She has some tenderness over the incision. No tenderness in the medial aspect of the breast and no palpable signs of tumor recurrence. No palpable abnormalities of the right breast. No palpable axillary adenopathy bilaterally. No palpable cervical or supraclavicular adenopathy.  IMPRESSION: Theresa Clay is a 57 y.o. female status post breast conservation with no evidence of disease  PLAN:  Theresa Clay looks great. I  encouraged her to continue her yearly mammograms. I also encouraged her to continue her self breast examinations and, call me with any questions or concerns. She agreed to do so.    Theresa Hare, MD

## 2013-05-04 NOTE — Progress Notes (Signed)
Ms. Theresa Clay here for fu s/p radiaton therapy to hr left breast.  She denies any pain, but reports lingering soreness of her left nipple since completing radiation therapy.  She  also denies any lingering fatigue from radiation therapy.

## 2013-06-06 DIAGNOSIS — F330 Major depressive disorder, recurrent, mild: Secondary | ICD-10-CM | POA: Diagnosis not present

## 2013-07-31 DIAGNOSIS — F330 Major depressive disorder, recurrent, mild: Secondary | ICD-10-CM | POA: Diagnosis not present

## 2013-08-08 DIAGNOSIS — F330 Major depressive disorder, recurrent, mild: Secondary | ICD-10-CM | POA: Diagnosis not present

## 2013-09-11 DIAGNOSIS — K5900 Constipation, unspecified: Secondary | ICD-10-CM | POA: Diagnosis not present

## 2013-09-11 DIAGNOSIS — D509 Iron deficiency anemia, unspecified: Secondary | ICD-10-CM | POA: Diagnosis not present

## 2013-09-20 DIAGNOSIS — F330 Major depressive disorder, recurrent, mild: Secondary | ICD-10-CM | POA: Diagnosis not present

## 2013-10-04 DIAGNOSIS — F330 Major depressive disorder, recurrent, mild: Secondary | ICD-10-CM | POA: Diagnosis not present

## 2013-10-31 DIAGNOSIS — F330 Major depressive disorder, recurrent, mild: Secondary | ICD-10-CM | POA: Diagnosis not present

## 2013-11-28 DIAGNOSIS — F330 Major depressive disorder, recurrent, mild: Secondary | ICD-10-CM | POA: Diagnosis not present

## 2013-11-30 DIAGNOSIS — Z79899 Other long term (current) drug therapy: Secondary | ICD-10-CM | POA: Diagnosis not present

## 2013-11-30 DIAGNOSIS — F330 Major depressive disorder, recurrent, mild: Secondary | ICD-10-CM | POA: Diagnosis not present

## 2014-01-08 DIAGNOSIS — F330 Major depressive disorder, recurrent, mild: Secondary | ICD-10-CM | POA: Diagnosis not present

## 2014-01-09 ENCOUNTER — Encounter: Payer: Self-pay | Admitting: Internal Medicine

## 2014-01-29 ENCOUNTER — Other Ambulatory Visit: Payer: Self-pay | Admitting: Radiation Oncology

## 2014-01-29 DIAGNOSIS — Z853 Personal history of malignant neoplasm of breast: Secondary | ICD-10-CM

## 2014-02-06 DIAGNOSIS — F330 Major depressive disorder, recurrent, mild: Secondary | ICD-10-CM | POA: Diagnosis not present

## 2014-02-28 DIAGNOSIS — F330 Major depressive disorder, recurrent, mild: Secondary | ICD-10-CM | POA: Diagnosis not present

## 2014-03-05 DIAGNOSIS — F330 Major depressive disorder, recurrent, mild: Secondary | ICD-10-CM | POA: Diagnosis not present

## 2014-03-29 ENCOUNTER — Ambulatory Visit (HOSPITAL_COMMUNITY)
Admission: RE | Admit: 2014-03-29 | Discharge: 2014-03-29 | Disposition: A | Discharge status: Home / Self Care | Payer: 59 | Source: Ambulatory Visit | Attending: Family Medicine | Admitting: Family Medicine

## 2014-03-29 ENCOUNTER — Encounter (HOSPITAL_COMMUNITY): Payer: Self-pay | Admitting: Emergency Medicine

## 2014-03-29 ENCOUNTER — Inpatient Hospital Stay (HOSPITAL_COMMUNITY)
Admission: EM | Admit: 2014-03-29 | Discharge: 2014-04-02 | DRG: 392 | Disposition: A | Discharge status: Home / Self Care | Payer: 59 | Attending: Internal Medicine | Admitting: Internal Medicine

## 2014-03-29 ENCOUNTER — Encounter (HOSPITAL_COMMUNITY): Payer: Self-pay

## 2014-03-29 ENCOUNTER — Other Ambulatory Visit (HOSPITAL_COMMUNITY): Payer: Self-pay | Admitting: Family Medicine

## 2014-03-29 DIAGNOSIS — F319 Bipolar disorder, unspecified: Secondary | ICD-10-CM | POA: Diagnosis present

## 2014-03-29 DIAGNOSIS — M797 Fibromyalgia: Secondary | ICD-10-CM | POA: Diagnosis present

## 2014-03-29 DIAGNOSIS — Z9884 Bariatric surgery status: Secondary | ICD-10-CM | POA: Diagnosis not present

## 2014-03-29 DIAGNOSIS — Z880 Allergy status to penicillin: Secondary | ICD-10-CM | POA: Diagnosis not present

## 2014-03-29 DIAGNOSIS — R1030 Lower abdominal pain, unspecified: Secondary | ICD-10-CM

## 2014-03-29 DIAGNOSIS — K5900 Constipation, unspecified: Secondary | ICD-10-CM | POA: Diagnosis present

## 2014-03-29 DIAGNOSIS — K5712 Diverticulitis of small intestine without perforation or abscess without bleeding: Secondary | ICD-10-CM | POA: Diagnosis present

## 2014-03-29 DIAGNOSIS — Z96652 Presence of left artificial knee joint: Secondary | ICD-10-CM | POA: Diagnosis present

## 2014-03-29 DIAGNOSIS — R109 Unspecified abdominal pain: Secondary | ICD-10-CM | POA: Diagnosis not present

## 2014-03-29 DIAGNOSIS — Z885 Allergy status to narcotic agent status: Secondary | ICD-10-CM | POA: Diagnosis not present

## 2014-03-29 DIAGNOSIS — R7989 Other specified abnormal findings of blood chemistry: Secondary | ICD-10-CM | POA: Diagnosis present

## 2014-03-29 DIAGNOSIS — E039 Hypothyroidism, unspecified: Secondary | ICD-10-CM | POA: Diagnosis present

## 2014-03-29 DIAGNOSIS — D72829 Elevated white blood cell count, unspecified: Secondary | ICD-10-CM | POA: Diagnosis present

## 2014-03-29 DIAGNOSIS — F419 Anxiety disorder, unspecified: Secondary | ICD-10-CM | POA: Diagnosis present

## 2014-03-29 DIAGNOSIS — K219 Gastro-esophageal reflux disease without esophagitis: Secondary | ICD-10-CM | POA: Diagnosis present

## 2014-03-29 DIAGNOSIS — K567 Ileus, unspecified: Secondary | ICD-10-CM | POA: Diagnosis not present

## 2014-03-29 DIAGNOSIS — M179 Osteoarthritis of knee, unspecified: Secondary | ICD-10-CM | POA: Diagnosis present

## 2014-03-29 DIAGNOSIS — Z923 Personal history of irradiation: Secondary | ICD-10-CM | POA: Diagnosis not present

## 2014-03-29 DIAGNOSIS — R1031 Right lower quadrant pain: Secondary | ICD-10-CM | POA: Diagnosis not present

## 2014-03-29 DIAGNOSIS — K5710 Diverticulosis of small intestine without perforation or abscess without bleeding: Secondary | ICD-10-CM

## 2014-03-29 DIAGNOSIS — Z853 Personal history of malignant neoplasm of breast: Secondary | ICD-10-CM

## 2014-03-29 DIAGNOSIS — G4710 Hypersomnia, unspecified: Secondary | ICD-10-CM | POA: Diagnosis present

## 2014-03-29 DIAGNOSIS — E785 Hyperlipidemia, unspecified: Secondary | ICD-10-CM | POA: Diagnosis present

## 2014-03-29 LAB — URINALYSIS, ROUTINE W REFLEX MICROSCOPIC
Bilirubin Urine: NEGATIVE
Glucose, UA: NEGATIVE mg/dL
Ketones, ur: NEGATIVE mg/dL
Leukocytes, UA: NEGATIVE
Nitrite: NEGATIVE
Protein, ur: NEGATIVE mg/dL
Specific Gravity, Urine: >1.046 — ABNORMAL HIGH (ref 1.005–1.030)
Urobilinogen, UA: 1 mg/dL (ref 0.0–1.0)
pH: 7 (ref 5.0–8.0)

## 2014-03-29 LAB — CBC WITH DIFFERENTIAL/PLATELET
Basophils Absolute: 0 10*3/uL (ref 0.0–0.1)
Basophils Relative: 0 % (ref 0–1)
Eosinophils Absolute: 0.2 10*3/uL (ref 0.0–0.7)
Eosinophils Relative: 1 % (ref 0–5)
HCT: 44.3 % (ref 36.0–46.0)
Hemoglobin: 14.5 g/dL (ref 12.0–15.0)
Lymphocytes Relative: 7 % — ABNORMAL LOW (ref 12–46)
Lymphs Abs: 1.4 10*3/uL (ref 0.7–4.0)
MCH: 28.5 pg (ref 26.0–34.0)
MCHC: 32.7 g/dL (ref 30.0–36.0)
MCV: 87.2 fL (ref 78.0–100.0)
Monocytes Absolute: 1.1 10*3/uL — ABNORMAL HIGH (ref 0.1–1.0)
Monocytes Relative: 5 % (ref 3–12)
Neutro Abs: 17.5 10*3/uL — ABNORMAL HIGH (ref 1.7–7.7)
Neutrophils Relative %: 87 % — ABNORMAL HIGH (ref 43–77)
Platelets: 306 10*3/uL (ref 150–400)
RBC: 5.08 MIL/uL (ref 3.87–5.11)
RDW: 13.9 % (ref 11.5–15.5)
WBC: 20.2 10*3/uL — ABNORMAL HIGH (ref 4.0–10.5)

## 2014-03-29 LAB — URINE MICROSCOPIC-ADD ON

## 2014-03-29 LAB — COMPREHENSIVE METABOLIC PANEL
ALT: 11 U/L (ref 0–35)
AST: 13 U/L (ref 0–37)
Albumin: 4.4 g/dL (ref 3.5–5.2)
Alkaline Phosphatase: 104 U/L (ref 39–117)
Anion gap: 13 (ref 5–15)
BUN: 7 mg/dL (ref 6–23)
CO2: 22 mEq/L (ref 19–32)
Calcium: 10.1 mg/dL (ref 8.4–10.5)
Chloride: 100 mEq/L (ref 96–112)
Creatinine, Ser: 0.88 mg/dL (ref 0.50–1.10)
GFR calc Af Amer: 84 mL/min — ABNORMAL LOW (ref >90)
GFR calc non Af Amer: 72 mL/min — ABNORMAL LOW (ref >90)
Glucose, Bld: 107 mg/dL — ABNORMAL HIGH (ref 70–99)
Potassium: 4.2 mEq/L (ref 3.7–5.3)
Sodium: 135 mEq/L — ABNORMAL LOW (ref 137–147)
Total Bilirubin: 0.3 mg/dL (ref 0.3–1.2)
Total Protein: 7.5 g/dL (ref 6.0–8.3)

## 2014-03-29 LAB — LIPASE, BLOOD: Lipase: 26 U/L (ref 11–59)

## 2014-03-29 LAB — I-STAT TROPONIN, ED: Troponin i, poc: 0.01 ng/mL (ref 0.00–0.08)

## 2014-03-29 MED ORDER — ADULT MULTIVITAMIN W/MINERALS CH
1.0000 | ORAL_TABLET | Freq: Every day | ORAL | Status: DC
  Administered 2014-03-30 – 2014-04-02 (×4): 1 via ORAL
  Filled 2014-03-29 (×4): qty 1

## 2014-03-29 MED ORDER — LAMOTRIGINE 200 MG PO TABS
200.0000 mg | ORAL_TABLET | Freq: Every day | ORAL | Status: DC
  Administered 2014-03-29 – 2014-04-01 (×4): 200 mg via ORAL
  Filled 2014-03-29 (×5): qty 1

## 2014-03-29 MED ORDER — ALBUTEROL SULFATE HFA 108 (90 BASE) MCG/ACT IN AERS: 2.0000 | INHALATION_SPRAY | RESPIRATORY_TRACT | Status: DC | PRN

## 2014-03-29 MED ORDER — MORPHINE SULFATE 4 MG/ML IJ SOLN
6.0000 mg | Freq: Once | INTRAMUSCULAR | Status: AC
  Administered 2014-03-29: 6 mg via INTRAVENOUS
  Filled 2014-03-29: qty 2

## 2014-03-29 MED ORDER — LITHIUM CARBONATE 300 MG PO CAPS
300.0000 mg | ORAL_CAPSULE | Freq: Every day | ORAL | Status: DC
  Administered 2014-03-30 – 2014-04-02 (×4): 300 mg via ORAL
  Filled 2014-03-29 (×4): qty 1

## 2014-03-29 MED ORDER — ONDANSETRON HCL 4 MG/2ML IJ SOLN
4.0000 mg | Freq: Once | INTRAMUSCULAR | Status: AC
  Administered 2014-03-29: 4 mg via INTRAVENOUS
  Filled 2014-03-29: qty 2

## 2014-03-29 MED ORDER — LEVOTHYROXINE SODIUM 100 MCG PO TABS
100.0000 ug | ORAL_TABLET | Freq: Every day | ORAL | Status: DC
  Administered 2014-03-29 – 2014-04-01 (×4): 100 ug via ORAL
  Filled 2014-03-29 (×5): qty 1

## 2014-03-29 MED ORDER — METRONIDAZOLE IN NACL 5-0.79 MG/ML-% IV SOLN
500.0000 mg | Freq: Once | INTRAVENOUS | Status: DC
  Administered 2014-03-29: 500 mg via INTRAVENOUS
  Filled 2014-03-29: qty 100

## 2014-03-29 MED ORDER — TRAZODONE HCL 150 MG PO TABS
300.0000 mg | ORAL_TABLET | Freq: Every day | ORAL | Status: DC
  Administered 2014-03-29 – 2014-04-01 (×4): 300 mg via ORAL
  Filled 2014-03-29 (×5): qty 2

## 2014-03-29 MED ORDER — LURASIDONE HCL 40 MG PO TABS
40.0000 mg | ORAL_TABLET | Freq: Every day | ORAL | Status: DC
  Administered 2014-03-29 – 2014-04-01 (×4): 40 mg via ORAL
  Filled 2014-03-29 (×5): qty 1

## 2014-03-29 MED ORDER — MORPHINE SULFATE 2 MG/ML IJ SOLN
2.0000 mg | INTRAMUSCULAR | Status: DC | PRN
  Administered 2014-03-29 – 2014-03-30 (×4): 2 mg via INTRAVENOUS
  Filled 2014-03-29 (×4): qty 1

## 2014-03-29 MED ORDER — PANTOPRAZOLE SODIUM 40 MG PO TBEC
80.0000 mg | DELAYED_RELEASE_TABLET | Freq: Every day | ORAL | Status: DC
  Administered 2014-03-30 – 2014-04-02 (×4): 80 mg via ORAL
  Filled 2014-03-29 (×4): qty 2

## 2014-03-29 MED ORDER — VITAMIN B-1 100 MG PO TABS
100.0000 mg | ORAL_TABLET | Freq: Every day | ORAL | Status: DC
  Administered 2014-03-30 – 2014-04-02 (×4): 100 mg via ORAL
  Filled 2014-03-29 (×4): qty 1

## 2014-03-29 MED ORDER — LORAZEPAM 1 MG PO TABS
1.0000 mg | ORAL_TABLET | Freq: Every day | ORAL | Status: DC
  Administered 2014-03-29 – 2014-04-01 (×4): 1 mg via ORAL
  Filled 2014-03-29 (×4): qty 1

## 2014-03-29 MED ORDER — LORAZEPAM 0.5 MG PO TABS
0.5000 mg | ORAL_TABLET | Freq: Every day | ORAL | Status: DC
  Administered 2014-03-30 – 2014-04-02 (×4): 0.5 mg via ORAL
  Filled 2014-03-29 (×4): qty 1

## 2014-03-29 MED ORDER — IOHEXOL 300 MG/ML  SOLN
100.0000 mL | Freq: Once | INTRAMUSCULAR | Status: AC | PRN
  Administered 2014-03-29: 100 mL via INTRAVENOUS

## 2014-03-29 MED ORDER — HEPARIN SODIUM (PORCINE) 5000 UNIT/ML IJ SOLN
5000.0000 [IU] | Freq: Three times a day (TID) | INTRAMUSCULAR | Status: DC
  Administered 2014-03-29 – 2014-04-02 (×10): 5000 [IU] via SUBCUTANEOUS
  Filled 2014-03-29 (×14): qty 1

## 2014-03-29 MED ORDER — SODIUM CHLORIDE 0.9 % IV BOLUS (SEPSIS)
1000.0000 mL | Freq: Once | INTRAVENOUS | Status: AC
  Administered 2014-03-29: 1000 mL via INTRAVENOUS

## 2014-03-29 MED ORDER — CIPROFLOXACIN IN D5W 400 MG/200ML IV SOLN
400.0000 mg | Freq: Once | INTRAVENOUS | Status: AC
  Administered 2014-03-29: 400 mg via INTRAVENOUS
  Filled 2014-03-29: qty 200

## 2014-03-29 MED ORDER — ONDANSETRON HCL 4 MG/2ML IJ SOLN
4.0000 mg | Freq: Four times a day (QID) | INTRAMUSCULAR | Status: DC | PRN
  Administered 2014-03-30: 4 mg via INTRAVENOUS
  Filled 2014-03-29: qty 2

## 2014-03-29 MED ORDER — TIZANIDINE HCL 4 MG PO TABS
4.0000 mg | ORAL_TABLET | Freq: Every day | ORAL | Status: DC
  Filled 2014-03-29: qty 1

## 2014-03-29 MED ORDER — ONDANSETRON HCL 4 MG PO TABS: 4.0000 mg | ORAL_TABLET | Freq: Four times a day (QID) | ORAL | Status: DC | PRN

## 2014-03-29 MED ORDER — SODIUM CHLORIDE 0.9 % IV SOLN
INTRAVENOUS | Status: DC
  Administered 2014-03-29 – 2014-04-01 (×4): via INTRAVENOUS

## 2014-03-29 MED ORDER — FOLIC ACID 1 MG PO TABS
1.0000 mg | ORAL_TABLET | Freq: Every day | ORAL | Status: DC
  Administered 2014-03-30 – 2014-04-02 (×4): 1 mg via ORAL
  Filled 2014-03-29 (×4): qty 1

## 2014-03-29 MED ORDER — LITHIUM CARBONATE 300 MG PO CAPS
600.0000 mg | ORAL_CAPSULE | Freq: Every day | ORAL | Status: DC
  Administered 2014-03-29 – 2014-04-01 (×4): 600 mg via ORAL
  Filled 2014-03-29 (×5): qty 2

## 2014-03-29 MED ORDER — ALBUTEROL SULFATE (2.5 MG/3ML) 0.083% IN NEBU: 2.5000 mg | INHALATION_SOLUTION | RESPIRATORY_TRACT | Status: DC | PRN

## 2014-03-29 MED ORDER — AMPHETAMINE-DEXTROAMPHET ER 10 MG PO CP24
20.0000 mg | ORAL_CAPSULE | Freq: Two times a day (BID) | ORAL | Status: DC
  Administered 2014-03-30 – 2014-04-02 (×7): 20 mg via ORAL
  Filled 2014-03-29 (×7): qty 2

## 2014-03-29 NOTE — H&P (Signed)
[facility] History and Physical  Theresa Clay:[id number] DOB: [birth year] DOA: 03/29/2014  Referring physician: Clayton Bibles, PA PCP: Hulen Shouts, MD   Chief Complaint: Abdominal PAin  HPI: Theresa Clay is a 58 y.o. female presents with abdominal pain. She states that she was fine until today when she developed severe abdominal pain. Patient states that she broke out into a sweat associated with the pain. Patient states that she has had some nausea and also has had some vomiting. Patient states that she has some loose stools too. Patient has been eating fine. She has no fevers or chills noted. She states that she has no blood in stools. The pain does not appear to radiate anywhere else. Patient had a scan done and this shows presence of inflammation and possible diverticular involvement.    Review of Systems:  12 point ROS was unremarkable other than noted in HPI   Past Medical History  Diagnosis Date  . Hx of radiation therapy 04/04/08-05/17/08    left breast, tumor bed  . Bipolar 1 disorder   . Fibromyalgia   . Osteoarthritis   . Hypothyroidism   . Depression   . Anxiety   . GERD (gastroesophageal reflux disease)   . Hyperlipemia   . Esophageal reflux   . 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 Breast cancer 12/13/07    left , ER/PR +   Past Surgical History  Procedure Laterality Date  . Knee surgery  10/2011    reconstruction  . Gastric bypass    . Tonsillectomy    . Cholecystectomy    . Total abdominal hysterectomy w/ bilateral salpingoophorectomy    . Knee arthroscopy Left 08/09/2012    Procedure: ARTHROSCOPY LEFT KNEE chondroplasty;  Surgeon: Hessie Dibble, MD;  Location: [place];  Service: Orthopedics;  Laterality: Left;   Social History:  reports that she has been smoking.  She does not have any smokeless tobacco history on file. She reports that she does not drink alcohol or use illicit drugs.  Allergies  Allergen Reactions  .  Darvocet [Propoxyphene N-Acetaminophen] Anaphylaxis  . Penicillins Anaphylaxis  . Codeine Other (See Comments)    "double over in pain" per pt  . Hydrocodone Nausea And Vomiting    pain  . Oxycodone Nausea And Vomiting    pain  . Simvastatin     myalgias    Family History  Problem Relation Age of Onset  . Breast cancer Mother 43  . Melanoma Mother   . Dementia Father   . Cancer - Prostate Maternal Grandfather      Prior to Admission medications   Medication Sig Start Date End Date Taking? Authorizing Provider  albuterol (PROVENTIL HFA;VENTOLIN HFA) 108 (90 BASE) MCG/ACT inhaler Inhale 2 puffs into the lungs every 4 (four) hours as needed for wheezing. 2 puffs as needed   Yes Historical Provider, MD  amphetamine-dextroamphetamine (ADDERALL XR) 20 MG 24 hr capsule Take 20 mg by mouth 2 (two) times daily.   Yes Historical Provider, MD  CELEBREX 200 MG capsule 200 mg daily.  03/11/12  Yes Historical Provider, MD  lamoTRIgine (LAMICTAL) 200 MG tablet Take 200 mg by mouth at bedtime.   Yes Historical Provider, MD  levothyroxine (SYNTHROID, LEVOTHROID) 100 MCG tablet Take 100 mcg by mouth at bedtime.   Yes Historical Provider, MD  LIDODERM 5 % Place 1-3 patches onto the skin every 12 (twelve) hours as needed (pain).  02/12/12  Yes Historical Provider, MD  lithium carbonate 300 MG capsule Take 300-600 mg by mouth 2 (two) times daily. One tablet in am two tablets pm 03/11/12  Yes Historical Provider, MD  LORazepam (ATIVAN) 1 MG tablet Take 0.5-1 mg by mouth 2 (two) times daily. Take 1mg  at night and 0.5mg  in the morning   Yes Historical Provider, MD  lurasidone (LATUDA) 80 MG TABS Take by mouth daily. One tablet  With food once daily   Yes Historical Provider, MD  Multiple Vitamin (MULTIVITAMIN) tablet Take 1 tablet by mouth daily.   Yes Historical Provider, MD  omeprazole (PRILOSEC) 40 MG capsule 40 mg daily.  03/11/12  Yes Historical Provider, MD  tiZANidine (ZANAFLEX) 4 MG capsule Take 4 mg  by mouth daily. Take daily   Yes Historical Provider, MD  traZODone (DESYREL) 150 MG tablet Take 300 mg by mouth at bedtime.   Yes Historical Provider, MD  valACYclovir (VALTREX) 500 MG tablet Take 500 mg by mouth daily. One tablet every 12 hours as needed   Yes Historical Provider, MD  UNABLE TO FIND B12 injections one or twice weekly    Historical Provider, MD   Physical Exam: Filed Vitals:   03/29/14 1733 03/29/14 1930 03/29/14 2000  BP: 128/87 131/68 139/74  Pulse: 77 65 77  Temp: 98.4 F (36.9 C)    TempSrc: Oral    Resp: 16    SpO2: 100% 100% 97%    Wt Readings from Last 3 Encounters:  05/04/13 71.26 kg (157 lb 1.6 oz)  01/17/13 79.47 kg (175 lb 3.2 oz)  12/09/12 78.926 kg (174 lb)    General:  Appears calm and comfortable Eyes: PERRL, normal lids, irises & conjunctiva ENT: grossly normal hearing, lips & tongue Neck: no LAD, masses or thyromegaly Cardiovascular: RRR, no m/r/g. No LE edema. Respiratory: CTA bilaterally, no w/r/r. Normal respiratory effort. Abdomen: soft, c/o pain in the right upper abdomen no rebound Skin: no rash or induration seen on limited exam Musculoskeletal: grossly normal tone BUE/BLE Psychiatric: grossly normal mood and affect, speech fluent and appropriate Neurologic: grossly non-focal.          Labs on Admission:  Basic Metabolic Panel:  Recent Labs Lab 03/29/14 1820  NA 135*  K 4.2  CL 100  CO2 22  GLUCOSE 107*  BUN 7  CREATININE 0.88  CALCIUM 10.1   Liver Function Tests:  Recent Labs Lab 03/29/14 1820  AST 13  ALT 11  ALKPHOS 104  BILITOT 0.3  PROT 7.5  ALBUMIN 4.4    Recent Labs Lab 03/29/14 1820  LIPASE 26   No results found for this basename: AMMONIA,  in the last 168 hours CBC:  Recent Labs Lab 03/29/14 1820  WBC 20.2*  NEUTROABS 17.5*  HGB 14.5  HCT 44.3  MCV 87.2  PLT 306   Cardiac Enzymes: No results found for this basename: CKTOTAL, CKMB, CKMBINDEX, TROPONINI,  in the last 168 hours  BNP  (last 3 results) No results found for this basename: PROBNP,  in the last 8760 hours CBG: No results found for this basename: GLUCAP,  in the last 168 hours  Radiological Exams on Admission: Ct Abdomen Pelvis W Contrast  03/29/2014   CLINICAL DATA:  Lower abdomen pain, nausea and vomiting for 1 day, history of gastric bypass  EXAM: CT ABDOMEN AND PELVIS WITH CONTRAST  TECHNIQUE: Multidetector CT imaging of the abdomen and pelvis was performed using the standard protocol following bolus administration of intravenous contrast.  CONTRAST:  180mL OMNIPAQUE IOHEXOL 300  MG/ML  SOLN  COMPARISON:  None.  FINDINGS: A calcified granuloma is present in the anterior right lower lobe laterally. No other abnormality is seen through the lung bases. The liver enhances with no focal abnormality and no ductal dilatation is seen. Surgical clips are present from prior cholecystectomy.  The pancreas is within normal limits in size and the pancreatic duct is not dilated. However just posterior to the pancreatic head there is a rounded inhomogeneous low-attenuation structure of 1.7 x 2.0 cm. There is indistinctness surrounding this lesion which could indicate inflammation. This could represent an exophytic lesion emanating from the junction of the pancreatic head and uncinate process. Therefore correlation with pancreatic enzymes is recommended. Another consideration is that of an inflamed duodenal diverticulum. If possible in this patient with history of gastric bypass, endoscopic ultrasound may be helpful to assess further.  The adrenal glands and spleen are unremarkable with several calcified splenic granulomas present from prior granulomatous disease. The kidneys enhance and there is a cyst emanating from the upper pole of the right kidney medially. On delayed images the pelvocaliceal systems are unremarkable. The abdominal aorta is normal in caliber. No adenopathy is seen.  The urinary bladder is not well distended but no  abnormality is seen. The uterus appears to have been resected. No adnexal lesion is seen. There are multiple diverticula scattered throughout the entire colon concentrated primarily in the descending and rectosigmoid colon. The terminal ileum is unremarkable, as is the appendix. There is no evidence of appendicitis. No fluid is seen within the pelvis. The lumbar vertebrae are in normal alignment with mild degenerative disc disease at L2-3.  IMPRESSION: 1. Inhomogeneous rounded lesion adjacent to the descending duodenum and ampulla of 1.7 x 2.0 cm. Consider an exophytic pancreatic lesion, inflamed descending duodenum diverticulum or less likely an ampullary lesion. Endoscopic ultrasound is recommended if possible in this patient with history of gastric bypass. 2. Multiple colonic diverticula. 3. Changes of prior granulomatous disease. 4. The appendix and terminal ileum appear normal.   Electronically Signed   By: Ivar Drape M.D.   On: 03/29/2014 15:48      Assessment/Plan Principal Problem:   Diverticula of small intestine Active Problems:   Bipolar 1 disorder   Fibromyalgia   Diverticulitis of duodenum   1. Diverticula of the duodenum -will admit -start on IVF -advance diet as tolerated -start on cipro IV  2. Bipolar Disorder -will continue with home medications -currently stable  3. Fibromyalgia -currently stable -pain medications as needed    Code Status: Full Code (must indicate code status--if unknown or must be presumed, indicate so) DVT Prophylaxis:Heparin Family Communication: Husband (indicate person spoken with, if applicable, with phone number if by telephone) Disposition Plan: Home (indicate anticipated LOS)  Time spent: 2min  Jaydenn Boccio A [facility] Pager [phone]

## 2014-03-29 NOTE — ED Notes (Signed)
Pt c/o RUQ abdominal pain radiating to back, n/v, diarrhea. CT performed today, found "lesion adjacent to the descending duodenum and ampulla... Consider pancreatic lesion... Multiple colonic diverticula... Appendix and terminal ileum appear normal."

## 2014-03-29 NOTE — ED Provider Notes (Signed)
Medical screening examination/treatment/procedure(s) were conducted as a shared visit with non-physician practitioner(s) and myself.  I personally evaluated the patient during the encounter.   EKG Interpretation None        Artis Delay, MD 03/29/14 2354

## 2014-03-29 NOTE — ED Provider Notes (Signed)
CSN: [id number]     Arrival date & time 03/29/14  1642 History   First MD Initiated Contact with Patient 03/29/14 1758     Chief Complaint  Patient presents with  . Abdominal Pain     (Consider location/radiation/quality/duration/timing/severity/associated sxs/prior Treatment) The history is provided by the patient, medical records and the spouse.    Patient presents with one day of abdominal pain that involves her lower abdomen and RUQ and radiates into her back.  Associated nausea and diarrhea.  Described as "just pain."  10/10 intensity.   Pt was seen by her PCP this morning who ordered labs (unavailable) and CT scan that showed abnormality (please see scan results below).  Pt reports she came to ED because pain is out of control.  States she felt fine last night, no symptoms at all.  Eating and drinking normally, no abnormal foods.  She has chronic myalgias from fibromyalgia that are unchanged.   Denies fevers, chills, CP, SOB, vomiting, urinary symptoms.    IMPRESSION: 1. Inhomogeneous rounded lesion adjacent to the descending duodenum and ampulla of 1.7 x 2.0 cm. Consider an exophytic pancreatic lesion, inflamed descending duodenum diverticulum or less likely an ampullary lesion. Endoscopic ultrasound is recommended if possible in this patient with history of gastric bypass. 2. Multiple colonic diverticula. 3. Changes of prior granulomatous disease. 4. The appendix and terminal ileum appear normal.   Electronically Signed   By: Ivar Drape M.D.   On: 03/29/2014 15:48    Past Medical History  Diagnosis Date  . Hx of radiation therapy 04/04/08-05/17/08    left breast, tumor bed  . Bipolar 1 disorder   . Fibromyalgia   . Osteoarthritis   . Hypothyroidism   . Depression   . Anxiety   . GERD (gastroesophageal reflux disease)   . Hyperlipemia   . Esophageal reflux   . 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 Breast cancer 12/13/07    left , ER/PR +   Past Surgical History  Procedure Laterality  Date  . Knee surgery  10/2011    reconstruction  . Gastric bypass    . Tonsillectomy    . Cholecystectomy    . Total abdominal hysterectomy w/ bilateral salpingoophorectomy    . Knee arthroscopy Left 08/09/2012    Procedure: ARTHROSCOPY LEFT KNEE chondroplasty;  Surgeon: Hessie Dibble, MD;  Location: [facility];  Service: Orthopedics;  Laterality: Left;   Family History  Problem Relation Age of Onset  . Breast cancer Mother 29  . Melanoma Mother   . Dementia Father   . Cancer - Prostate Maternal Grandfather    History  Substance Use Topics  . Smoking status: Current Every Day Smoker -- 0.50 packs/day  . Smokeless tobacco: Not on file     Comment: 5-6 cigarettes daily  . Alcohol Use: No   OB History   Grav Para Term Preterm Abortions TAB SAB Ect Mult Living                 Obstetric Comments   Menses age 89, g70, 5 miscarriages, HRT x 5 yrs     Review of Systems  All other systems reviewed and are negative.     Allergies  Darvocet; Penicillins; Codeine; and Simvastatin  Home Medications   Prior to Admission medications   Medication Sig Start Date End Date Taking? Authorizing Provider  albuterol (PROVENTIL HFA;VENTOLIN HFA) 108 (90 BASE) MCG/ACT inhaler Inhale 2 puffs into the lungs every 4 (four) hours  as needed for wheezing. 2 puffs as needed    Historical Provider, MD  amphetamine-dextroamphetamine (ADDERALL) 10 MG tablet Take 10 mg by mouth 2 (two) times daily.    Historical Provider, MD  CELEBREX 200 MG capsule 200 mg daily.  03/11/12   Historical Provider, MD  esomeprazole (NEXIUM) 40 MG capsule Take 40 mg by mouth daily before breakfast.    Historical Provider, MD  lamoTRIgine (LAMICTAL) 150 MG tablet Take 150 mg by mouth daily. Pm dosage    Historical Provider, MD  levothyroxine (SYNTHROID, LEVOTHROID) 50 MCG tablet 50 mcg daily. Take one tablet every morning on an empty stomach 03/11/12   Historical Provider, MD  LIDODERM 5 %  02/12/12    Historical Provider, MD  lithium carbonate 300 MG capsule 300 mg 3 (three) times daily. One tablet in am two tablets pm 03/11/12   Historical Provider, MD  LORazepam (ATIVAN) 0.5 MG tablet 0.5 mg daily as needed.  03/14/12   Historical Provider, MD  lurasidone (LATUDA) 80 MG TABS Take by mouth daily. One tablet  With food once daily    Historical Provider, MD  Multiple Vitamin (MULTIVITAMIN) tablet Take 1 tablet by mouth daily.    Historical Provider, MD  omeprazole (PRILOSEC) 40 MG capsule 40 mg daily.  03/11/12   Historical Provider, MD  pentazocine-naloxone (TALWIN NX) 50-0.5 MG per tablet Take 1 tablet by mouth 3 (three) times daily. As needed 08/09/12   Otelia Limes Carnaghi, PA-C  tiZANidine (ZANAFLEX) 4 MG capsule Take 4 mg by mouth daily. Take daily    Historical Provider, MD  traZODone (DESYREL) 100 MG tablet 300 mg at bedtime.  03/24/12   Historical Provider, MD  UNABLE TO FIND B12 injections one or twice weekly    Historical Provider, MD  valACYclovir (VALTREX) 500 MG tablet Take 500 mg by mouth daily. One tablet every 12 hours as needed    Historical Provider, MD   BP 128/87  Pulse 77  Temp(Src) 98.4 F (36.9 C) (Oral)  Resp 16  SpO2 100% Physical Exam  Nursing note and vitals reviewed. Constitutional: She appears well-developed and well-nourished. No distress.  HENT:  Head: Normocephalic and atraumatic.  Neck: Neck supple.  Cardiovascular: Normal rate and regular rhythm.   Pulmonary/Chest: Effort normal and breath sounds normal. No respiratory distress. She has no wheezes. She has no rales.  Abdominal: Soft. She exhibits no distension. There is tenderness in the right upper quadrant and right lower quadrant. There is CVA tenderness (right flank tender). There is no rebound and no guarding.  Neurological: She is alert.  Skin: She is not diaphoretic.    ED Course  Procedures (including critical care time) Labs Review Labs Reviewed  URINALYSIS, ROUTINE W REFLEX MICROSCOPIC -  Abnormal; Notable for the following:    Specific Gravity, Urine >1.046 (*)    Hgb urine dipstick SMALL (*)    All other components within normal limits  CBC WITH DIFFERENTIAL - Abnormal; Notable for the following:    WBC 20.2 (*)    Neutrophils Relative % 87 (*)    Neutro Abs 17.5 (*)    Lymphocytes Relative 7 (*)    Monocytes Absolute 1.1 (*)    All other components within normal limits  COMPREHENSIVE METABOLIC PANEL - Abnormal; Notable for the following:    Sodium 135 (*)    Glucose, Bld 107 (*)    GFR calc non Af Amer 72 (*)    GFR calc Af Amer 84 (*)  All other components within normal limits  LIPASE, BLOOD  URINE MICROSCOPIC-ADD ON  I-STAT TROPOININ, ED    Imaging Review Ct Abdomen Pelvis W Contrast  03/29/2014   CLINICAL DATA:  Lower abdomen pain, nausea and vomiting for 1 day, history of gastric bypass  EXAM: CT ABDOMEN AND PELVIS WITH CONTRAST  TECHNIQUE: Multidetector CT imaging of the abdomen and pelvis was performed using the standard protocol following bolus administration of intravenous contrast.  CONTRAST:  133mL OMNIPAQUE IOHEXOL 300 MG/ML  SOLN  COMPARISON:  None.  FINDINGS: A calcified granuloma is present in the anterior right lower lobe laterally. No other abnormality is seen through the lung bases. The liver enhances with no focal abnormality and no ductal dilatation is seen. Surgical clips are present from prior cholecystectomy.  The pancreas is within normal limits in size and the pancreatic duct is not dilated. However just posterior to the pancreatic head there is a rounded inhomogeneous low-attenuation structure of 1.7 x 2.0 cm. There is indistinctness surrounding this lesion which could indicate inflammation. This could represent an exophytic lesion emanating from the junction of the pancreatic head and uncinate process. Therefore correlation with pancreatic enzymes is recommended. Another consideration is that of an inflamed duodenal diverticulum. If possible in  this patient with history of gastric bypass, endoscopic ultrasound may be helpful to assess further.  The adrenal glands and spleen are unremarkable with several calcified splenic granulomas present from prior granulomatous disease. The kidneys enhance and there is a cyst emanating from the upper pole of the right kidney medially. On delayed images the pelvocaliceal systems are unremarkable. The abdominal aorta is normal in caliber. No adenopathy is seen.  The urinary bladder is not well distended but no abnormality is seen. The uterus appears to have been resected. No adnexal lesion is seen. There are multiple diverticula scattered throughout the entire colon concentrated primarily in the descending and rectosigmoid colon. The terminal ileum is unremarkable, as is the appendix. There is no evidence of appendicitis. No fluid is seen within the pelvis. The lumbar vertebrae are in normal alignment with mild degenerative disc disease at L2-3.  IMPRESSION: 1. Inhomogeneous rounded lesion adjacent to the descending duodenum and ampulla of 1.7 x 2.0 cm. Consider an exophytic pancreatic lesion, inflamed descending duodenum diverticulum or less likely an ampullary lesion. Endoscopic ultrasound is recommended if possible in this patient with history of gastric bypass. 2. Multiple colonic diverticula. 3. Changes of prior granulomatous disease. 4. The appendix and terminal ileum appear normal.   Electronically Signed   By: Ivar Drape M.D.   On: 03/29/2014 15:48     EKG Interpretation None      7:21 PM Pain improved from 9/10 to 5/10.   7:35 PM Discussed pt with Dr Doy Mince.    8:07 PM Discussed pt with Dr Hilarie Fredrickson (GI).  Given the acute onset and high WBC count, he suspect this is a duodenal diverticulitis.  Recommends observation admission for IV antibiotics.  He can see patient in consult tomorrow.   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  Per my discussion with Dr Hilarie Fredrickson, scope may not be helpful as he  may not be able to reach the affected segment.    8:13 PM Discussed pt with Dr Humphrey Rolls who will see and admit the patient.  Will do own bed request, holding orders.   MDM   Final diagnoses:  Diverticulitis, duodenum    Afebrile nontoxic patient with new abdominal pain with nausea and diarrhea that  began this morning, found to have  intrabdominal lesion on CT ordered by PCP.  Pt presented to ED for pain control.  WBC 20. Discussed CT scan and lab results with Dr Hilarie Fredrickson who advised treating as infectious diverticulitis.  Admitted to [place], Dr Humphrey Rolls.  Dr Hilarie Fredrickson to consult tomorrow.      Clayton Bibles, PA-C 03/29/14 2243

## 2014-03-29 NOTE — ED Provider Notes (Signed)
Medical screening examination/treatment/procedure(s) were conducted as a shared visit with non-physician practitioner(s) and myself.  I personally evaluated the patient during the encounter.   EKG Interpretation None      52 [place] female with sudden onset abdominal pain about 16 hours ago.  On exam, well appearing, nontoxic, not distressed, normal respiratory effort, normal perfusion, abdomen soft but tender in epigastrium and periumbilical regions. IV morphine has helped pain.  CT scan performed prior to this emergency department visit showed inflammation of the duodenum. Admitted for further treatment GI consult.  Clinical Impression: 1. Diverticulitis, duodenum       Artis Delay, MD 03/29/14 [phone]

## 2014-03-29 NOTE — ED Notes (Signed)
Attempted to call report, RN unavailable at this time.

## 2014-03-30 DIAGNOSIS — M797 Fibromyalgia: Secondary | ICD-10-CM

## 2014-03-30 LAB — COMPREHENSIVE METABOLIC PANEL
ALT: 257 U/L — ABNORMAL HIGH (ref 0–35)
AST: 311 U/L — ABNORMAL HIGH (ref 0–37)
Albumin: 3.4 g/dL — ABNORMAL LOW (ref 3.5–5.2)
Alkaline Phosphatase: 187 U/L — ABNORMAL HIGH (ref 39–117)
Anion gap: 12 (ref 5–15)
BUN: 5 mg/dL — ABNORMAL LOW (ref 6–23)
CO2: 21 mEq/L (ref 19–32)
Calcium: 9.1 mg/dL (ref 8.4–10.5)
Chloride: 101 mEq/L (ref 96–112)
Creatinine, Ser: 0.7 mg/dL (ref 0.50–1.10)
GFR calc Af Amer: >90 mL/min (ref >90)
GFR calc non Af Amer: >90 mL/min (ref >90)
Glucose, Bld: 138 mg/dL — ABNORMAL HIGH (ref 70–99)
Potassium: 3.9 mEq/L (ref 3.7–5.3)
Sodium: 134 mEq/L — ABNORMAL LOW (ref 137–147)
Total Bilirubin: 0.5 mg/dL (ref 0.3–1.2)
Total Protein: 6.1 g/dL (ref 6.0–8.3)

## 2014-03-30 LAB — CBC
HCT: 39.6 % (ref 36.0–46.0)
Hemoglobin: 12.8 g/dL (ref 12.0–15.0)
MCH: 28.1 pg (ref 26.0–34.0)
MCHC: 32.3 g/dL (ref 30.0–36.0)
MCV: 87 fL (ref 78.0–100.0)
Platelets: 269 10*3/uL (ref 150–400)
RBC: 4.55 MIL/uL (ref 3.87–5.11)
RDW: 14.1 % (ref 11.5–15.5)
WBC: 20.1 10*3/uL — ABNORMAL HIGH (ref 4.0–10.5)

## 2014-03-30 LAB — HEMOGLOBIN A1C
Hgb A1c MFr Bld: 5.6 % (ref <5.7)
Mean Plasma Glucose: 114 mg/dL (ref <117)

## 2014-03-30 LAB — GLUCOSE, CAPILLARY: Glucose-Capillary: 109 mg/dL — ABNORMAL HIGH (ref 70–99)

## 2014-03-30 LAB — TSH: TSH: 0.722 u[IU]/mL (ref 0.350–4.500)

## 2014-03-30 MED ORDER — CYCLOBENZAPRINE HCL 10 MG PO TABS
10.0000 mg | ORAL_TABLET | Freq: Once | ORAL | Status: DC
Start: 2014-03-30 — End: 2014-03-30

## 2014-03-30 MED ORDER — HYDROMORPHONE HCL 1 MG/ML IJ SOLN
1.0000 mg | INTRAMUSCULAR | Status: DC | PRN
  Administered 2014-03-30 – 2014-04-02 (×11): 1 mg via INTRAVENOUS
  Filled 2014-03-30 (×11): qty 1

## 2014-03-30 MED ORDER — CIPROFLOXACIN IN D5W 400 MG/200ML IV SOLN
400.0000 mg | Freq: Two times a day (BID) | INTRAVENOUS | Status: DC
  Administered 2014-03-30 – 2014-04-01 (×5): 400 mg via INTRAVENOUS
  Filled 2014-03-30 (×5): qty 200

## 2014-03-30 MED ORDER — CYCLOBENZAPRINE HCL 10 MG PO TABS
10.0000 mg | ORAL_TABLET | Freq: Every day | ORAL | Status: DC
  Administered 2014-03-30 – 2014-04-01 (×3): 10 mg via ORAL
  Filled 2014-03-30 (×5): qty 1

## 2014-03-30 MED ORDER — METRONIDAZOLE IN NACL 5-0.79 MG/ML-% IV SOLN
500.0000 mg | Freq: Three times a day (TID) | INTRAVENOUS | Status: DC
  Administered 2014-03-30 – 2014-04-01 (×7): 500 mg via INTRAVENOUS
  Filled 2014-03-30 (×8): qty 100

## 2014-03-30 NOTE — Consult Note (Signed)
[facility] Gastroenterology Consultation Note  Referring Provider: Dr. Marylu Lund [facility]) Primary Care Physician:  Hulen Shouts, MD  Reason for Consultation:  Abdominal pain, abnormal CT scan  HPI: Theresa Clay is a 58 y.o. female admitted for, and whom we've been asked to see on behalf of, abdominal pain.  Two nights ago, patient had acute onset of sharp lower abdominal pain which woke her from sleep.  Pain migrated to right flank and right upper quadrant region.  Pain is constant with improvement only with narcotics.  No association of symptoms to eating or movement.  Denies fevers.  Has some nausea.  Has chronic constipation, but no improvement of her pain recently with effective defecation.  Had CT scan performed, results below.     Past Medical History  Diagnosis Date  . Hx of radiation therapy 04/04/08-05/17/08    left breast, tumor bed  . Bipolar 1 disorder   . Fibromyalgia   . Osteoarthritis   . Hypothyroidism   . Depression   . Anxiety   . GERD (gastroesophageal reflux disease)   . Hyperlipemia   . Esophageal reflux   . 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 Breast cancer 12/13/07    left , ER/PR +    Past Surgical History  Procedure Laterality Date  . Knee surgery  10/2011    reconstruction  . Gastric bypass    . Tonsillectomy    . Cholecystectomy    . Total abdominal hysterectomy w/ bilateral salpingoophorectomy    . Knee arthroscopy Left 08/09/2012    Procedure: ARTHROSCOPY LEFT KNEE chondroplasty;  Surgeon: Hessie Dibble, MD;  Location: [facility];  Service: Orthopedics;  Laterality: Left;    Prior to Admission medications   Medication Sig Start Date End Date Taking? Authorizing Provider  albuterol (PROVENTIL HFA;VENTOLIN HFA) 108 (90 BASE) MCG/ACT inhaler Inhale 2 puffs into the lungs every 4 (four) hours as needed for wheezing. 2 puffs as needed   Yes Historical Provider, MD  amphetamine-dextroamphetamine (ADDERALL XR) 20 MG 24 hr capsule Take 20  mg by mouth 2 (two) times daily.   Yes Historical Provider, MD  CELEBREX 200 MG capsule 200 mg daily.  03/11/12  Yes Historical Provider, MD  lamoTRIgine (LAMICTAL) 200 MG tablet Take 200 mg by mouth at bedtime.   Yes Historical Provider, MD  levothyroxine (SYNTHROID, LEVOTHROID) 100 MCG tablet Take 100 mcg by mouth at bedtime.   Yes Historical Provider, MD  LIDODERM 5 % Place 1-3 patches onto the skin every 12 (twelve) hours as needed (pain).  02/12/12  Yes Historical Provider, MD  lithium carbonate 300 MG capsule Take 300-600 mg by mouth 2 (two) times daily. One tablet in am two tablets pm 03/11/12  Yes Historical Provider, MD  LORazepam (ATIVAN) 1 MG tablet Take 0.5-1 mg by mouth 2 (two) times daily. Take 1mg  at night and 0.5mg  in the morning   Yes Historical Provider, MD  lurasidone (LATUDA) 80 MG TABS Take by mouth daily. One tablet  With food once daily   Yes Historical Provider, MD  Multiple Vitamin (MULTIVITAMIN) tablet Take 1 tablet by mouth daily.   Yes Historical Provider, MD  omeprazole (PRILOSEC) 40 MG capsule 40 mg daily.  03/11/12  Yes Historical Provider, MD  tiZANidine (ZANAFLEX) 4 MG capsule Take 4 mg by mouth daily. Take daily   Yes Historical Provider, MD  traZODone (DESYREL) 150 MG tablet Take 300 mg by mouth at bedtime.   Yes Historical Provider, MD  valACYclovir (  VALTREX) 500 MG tablet Take 500 mg by mouth daily. One tablet every 12 hours as needed   Yes Historical Provider, MD  UNABLE TO FIND B12 injections one or twice weekly    Historical Provider, MD    Current Facility-Administered Medications  Medication Dose Route Frequency Provider Last Rate Last Dose  . 0.9 %  sodium chloride infusion   Intravenous Continuous Allyne Gee, MD 50 mL/hr at 03/29/14 2303    . albuterol (PROVENTIL) (2.5 MG/3ML) 0.083% nebulizer solution 2.5 mg  2.5 mg Nebulization Q4H PRN Allyne Gee, MD      . amphetamine-dextroamphetamine (ADDERALL XR) 24 hr capsule 20 mg  20 mg Oral BID WC Allyne Gee, MD   20 mg at 03/30/14 1404  . ciprofloxacin (CIPRO) IVPB 400 mg  400 mg Intravenous Q12H Allyne Gee, MD   400 mg at 03/30/14 1948  . cyclobenzaprine (FLEXERIL) tablet 10 mg  10 mg Oral QHS Allyne Gee, MD      . folic acid (FOLVITE) tablet 1 mg  1 mg Oral Daily Allyne Gee, MD   1 mg at 03/30/14 1041  . heparin injection 5,000 Units  5,000 Units Subcutaneous 3 times per day Allyne Gee, MD   5,000 Units at 03/30/14 1404  . HYDROmorphone (DILAUDID) injection 1 mg  1 mg Intravenous Q4H PRN Donne Hazel, MD   1 mg at 03/30/14 1739  . lamoTRIgine (LAMICTAL) tablet 200 mg  200 mg Oral QHS Allyne Gee, MD   200 mg at 03/29/14 2305  . levothyroxine (SYNTHROID, LEVOTHROID) tablet 100 mcg  100 mcg Oral QHS Allyne Gee, MD   100 mcg at 03/29/14 2305  . lithium carbonate capsule 300 mg  300 mg Oral Daily Allyne Gee, MD   300 mg at 03/30/14 1041  . lithium carbonate capsule 600 mg  600 mg Oral QHS Allyne Gee, MD   600 mg at 03/29/14 2305  . LORazepam (ATIVAN) tablet 0.5 mg  0.5 mg Oral Daily Allyne Gee, MD   0.5 mg at 03/30/14 1041  . LORazepam (ATIVAN) tablet 1 mg  1 mg Oral QHS Allyne Gee, MD   1 mg at 03/29/14 2307  . lurasidone (LATUDA) tablet 40 mg  40 mg Oral Daily Allyne Gee, MD   40 mg at 03/30/14 1041  . metroNIDAZOLE (FLAGYL) IVPB 500 mg  500 mg Intravenous 3 times per day Allyne Gee, MD   500 mg at 03/30/14 1405  . multivitamin with minerals tablet 1 tablet  1 tablet Oral Daily Allyne Gee, MD   1 tablet at 03/30/14 1041  . ondansetron (ZOFRAN) tablet 4 mg  4 mg Oral Q6H PRN Allyne Gee, MD       Or  . ondansetron [facility]) injection 4 mg  4 mg Intravenous Q6H PRN Allyne Gee, MD   4 mg at 03/30/14 1747  . pantoprazole (PROTONIX) EC tablet 80 mg  80 mg Oral Daily Allyne Gee, MD   80 mg at 03/30/14 1041  . thiamine (VITAMIN B-1) tablet 100 mg  100 mg Oral Daily Allyne Gee, MD   100 mg at 03/30/14 1041  . traZODone (DESYREL) tablet 300 mg  300 mg  Oral QHS Allyne Gee, MD   300 mg at 03/29/14 2304    Allergies as of 03/29/2014 - Review Complete 03/29/2014  Allergen Reaction Noted  . Darvocet [propoxyphene n-acetaminophen] Anaphylaxis  03/31/2012  . Penicillins Anaphylaxis 03/31/2012  . Codeine Other (See Comments) 08/03/2012  . Hydrocodone Nausea And Vomiting 03/29/2014  . Oxycodone Nausea And Vomiting 03/29/2014  . Simvastatin  12/09/2012    Family History  Problem Relation Age of Onset  . Breast cancer Mother 60  . Melanoma Mother   . Dementia Father   . Cancer - Prostate Maternal Grandfather     History   Social History  . Marital Status: Married    Spouse Name: N/A    Number of Children: 1  . Years of Education: college   Occupational History  . disabled     for [employer]   Social History Main Topics  . Smoking status: Current Every Day Smoker -- 0.50 packs/day for 5 years  . Smokeless tobacco: Never Used     Comment: 5-6 cigarettes daily  . Alcohol Use: No  . Drug Use: No  . Sexual Activity: Yes    Birth Control/ Protection: Surgical   Other Topics Concern  . Not on file   Social History Narrative  . No narrative on file    Review of Systems: Positive = bold Gen: Denies any fever, chills, rigors, night sweats, anorexia, fatigue, weakness, malaise, involuntary weight loss, and sleep disorder CV: Denies chest pain, angina, palpitations, syncope, orthopnea, PND, peripheral edema, and claudication. Resp: Denies dyspnea, cough, sputum, wheezing, coughing up blood. GI: Described in detail in HPI.    GU : Denies urinary burning, blood in urine, urinary frequency, urinary hesitancy, nocturnal urination, and urinary incontinence. MS: Denies joint pain or swelling.  Denies muscle weakness, cramps, atrophy.  Derm: Denies rash, itching, oral ulcerations, hives, unhealing ulcers.  Psych: Denies depression, anxiety, memory loss, suicidal ideation, hallucinations,  and confusion. Heme: Denies bruising, bleeding, and  enlarged lymph nodes. Neuro:  Denies any headaches, dizziness, paresthesias. Endo:  Denies any problems with DM, thyroid, adrenal function.  Physical Exam: Vital signs in last 24 hours: Temp:  [97.6 F (36.4 C)-98.9 F (37.2 C)] 98.3 F (36.8 C) (10/30 1524) Pulse Rate:  [72-86] 86 (10/30 1524) Resp:  [16-18] 17 (10/30 1524) BP: (121-139)/(61-76) 122/61 mmHg (10/30 1524) SpO2:  [97 %-100 %] 99 % (10/30 1524) Weight:  [72 kg (158 lb 11.7 oz)] 72 kg (158 lb 11.7 oz) (10/29 2225) Last BM Date: 03/29/14 General:   Alert,  Well-developed, well-nourished, depressed mood, flat affect, pleasant and cooperative in NAD Head:  Normocephalic and atraumatic. Eyes:  Sclera clear, no icterus.   Conjunctiva pink. Ears:  Normal auditory acuity. Nose:  No deformity, discharge,  or lesions. Mouth:  No deformity or lesions.  Oropharynx pink & moist. Neck:  Supple; no masses or thyromegaly. Lungs:  Clear throughout to auscultation.   No wheezes, crackles, or rhonchi. No acute distress. Heart:  Regular rate and rhythm; no murmurs, clicks, rubs,  or gallops. Abdomen:  Soft, but significant fairly point tenderness right flank/right subcostal region with voluntary guarding; non-tender elsewhere; nondistended. No masses, hepatosplenomegaly or hernias noted.  Msk:  Symmetrical without gross deformities. Normal posture. Pulses:  Normal pulses noted. Extremities:  Without clubbing or edema. Neurologic:  Alert and  oriented x4;  grossly normal neurologically. Skin:  Intact without significant lesions or rashes. Cervical Nodes:  No significant cervical adenopathy. Psych:  Alert and cooperative. Depressed mood, flat affect  Lab Results:  Recent Labs  03/29/14 1820 03/30/14 0420  WBC 20.2* 20.1*  HGB 14.5 12.8  HCT 44.3 39.6  PLT 306 269   BMET  Recent Labs  03/29/14 1820 03/30/14 0420  NA 135* 134*  K 4.2 3.9  CL 100 101  CO2 22 21  GLUCOSE 107* 138*  BUN 7 5*  CREATININE 0.88 0.70  CALCIUM  10.1 9.1   LFT  Recent Labs  03/30/14 0420  PROT 6.1  ALBUMIN 3.4*  AST 311*  ALT 257*  ALKPHOS 187*  BILITOT 0.5   PT/INR No results found for this basename: LABPROT, INR,  in the last 72 hours  Studies/Results: Ct Abdomen Pelvis W Contrast  03/29/2014   CLINICAL DATA:  Lower abdomen pain, nausea and vomiting for 1 day, history of gastric bypass  EXAM: CT ABDOMEN AND PELVIS WITH CONTRAST  TECHNIQUE: Multidetector CT imaging of the abdomen and pelvis was performed using the standard protocol following bolus administration of intravenous contrast.  CONTRAST:  156mL OMNIPAQUE IOHEXOL 300 MG/ML  SOLN  COMPARISON:  None.  FINDINGS: A calcified granuloma is present in the anterior right lower lobe laterally. No other abnormality is seen through the lung bases. The liver enhances with no focal abnormality and no ductal dilatation is seen. Surgical clips are present from prior cholecystectomy.  The pancreas is within normal limits in size and the pancreatic duct is not dilated. However just posterior to the pancreatic head there is a rounded inhomogeneous low-attenuation structure of 1.7 x 2.0 cm. There is indistinctness surrounding this lesion which could indicate inflammation. This could represent an exophytic lesion emanating from the junction of the pancreatic head and uncinate process. Therefore correlation with pancreatic enzymes is recommended. Another consideration is that of an inflamed duodenal diverticulum. If possible in this patient with history of gastric bypass, endoscopic ultrasound may be helpful to assess further.  The adrenal glands and spleen are unremarkable with several calcified splenic granulomas present from prior granulomatous disease. The kidneys enhance and there is a cyst emanating from the upper pole of the right kidney medially. On delayed images the pelvocaliceal systems are unremarkable. The abdominal aorta is normal in caliber. No adenopathy is seen.  The urinary  bladder is not well distended but no abnormality is seen. The uterus appears to have been resected. No adnexal lesion is seen. There are multiple diverticula scattered throughout the entire colon concentrated primarily in the descending and rectosigmoid colon. The terminal ileum is unremarkable, as is the appendix. There is no evidence of appendicitis. No fluid is seen within the pelvis. The lumbar vertebrae are in normal alignment with mild degenerative disc disease at L2-3.  IMPRESSION: 1. Inhomogeneous rounded lesion adjacent to the descending duodenum and ampulla of 1.7 x 2.0 cm. Consider an exophytic pancreatic lesion, inflamed descending duodenum diverticulum or less likely an ampullary lesion. Endoscopic ultrasound is recommended if possible in this patient with history of gastric bypass. 2. Multiple colonic diverticula. 3. Changes of prior granulomatous disease. 4. The appendix and terminal ileum appear normal.   Electronically Signed   By: Ivar Drape M.D.   On: 03/29/2014 15:48    Impression:  1.  Abdominal pain.  Lower abdominal, now migratory to the right flank and right subcostal region. 2.  Leukocytosis. 3.  Abnormal CT scan, abdomen.  Irregularity adjacent to pancreatic head.  Pancreatic lesion versus ? Inflamed duodenal diverticulum versus ampullary lesion.  Admittedly, however, it's not clear whether the area seen on CT necessarily represents the root cause of the patient's current abdominal pain symptoms. 4.  Increased LFTs, again raising specter for possible pancreatic/biliary/ampullary process.  LFTs normal August 2014.  Plan:  1.  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 2.  Would continue antibiotics and judicious analgesics for her abdominal pain and leukocytosis. 3.  Clear liquids ok; advance slowly as tolerated. 4.  Agree with surgical  consultation, though as I've told the family, the ampullary region is not necessarily readily accessed surgically, either. 5.  Agree with tumor marker studies. 6.  Might consider repeat CT scan after a few days of intravenous antibiotics, and measure up patient's symptoms with her interval change in CT appearance (if any) at that time. 7.  Difficult case; thank you for the consultation; [facility] will follow.   LOS: 1 day   Niamh Rada M  03/30/2014, 7:57 PM

## 2014-03-30 NOTE — Progress Notes (Signed)
[facility] PROGRESS NOTE  Theresa Clay BHA:[id number] DOB: 25-Apr-1956 DOA: 03/29/2014 PCP: Hulen Shouts, MD  Assessment/Plan: 1. Abd pain 1. Cont with analgesics as tolerated 2. Question if related to intra-abdominal lesion seen on CT (see below) 3. GI consulted. Discussed with GI, given post-gastric bypass anatomy, endoscopy would be difficult. As such, recs for Surgical Consult 4. Have consulted General Surgery for further recommendations 5. Continue cipro/flagyl for now 2. Inhomogeneous intra-abdominal lesion on CT 1. Adjacent to descending duodenum and ampulla with consideration for exophytic pancreatic lesion, inflamed descending duodenum diverticulum or ampullary lesion with recs for EUS 2. GI and General Surgery per above 3. Bipolar d/o 1. Stable 4. Fibromyalgia 1. Analgesics as needed, avoiding long-term narcotics 5. DVT prophylaxis 1. heparin  Code Status: Full Family Communication: Pt in room Disposition Plan: [place]   Consultants:  GI  Procedures:    Antibiotics:   (indicate start date, and stop date if known)  HPI/Subjective: Continues with R sided abd pain  Objective: Filed Vitals:   03/29/14 2100 03/29/14 2141 03/29/14 2225 03/30/14 0349  BP: 127/69  136/76 121/69  Pulse:   72 86  Temp:  97.9 F (36.6 C) 98.9 F (37.2 C) 97.6 F (36.4 C)  TempSrc:   Oral Oral  Resp:   16 18  Height:   5\' 2"  (1.575 m)   Weight:   72 kg (158 lb 11.7 oz)   SpO2:   100% 100%    Intake/Output Summary (Last 24 hours) at 03/30/14 1111 Last data filed at 03/30/14 0349  Gross per 24 hour  Intake      0 ml  Output    650 ml  Net   -650 ml   Filed Weights   03/29/14 2225  Weight: 72 kg (158 lb 11.7 oz)    Exam:   General:  Awake, in nad  Cardiovascular: regular, s1, s2  Respiratory: normal resp effort, no wheezing  Abdomen: soft, tenderness over R side  Musculoskeletal: perfused, no clubbing   Data Reviewed: Basic Metabolic  Panel:  Recent Labs Lab 03/29/14 1820 03/30/14 0420  NA 135* 134*  K 4.2 3.9  CL 100 101  CO2 22 21  GLUCOSE 107* 138*  BUN 7 5*  CREATININE 0.88 0.70  CALCIUM 10.1 9.1   Liver Function Tests:  Recent Labs Lab 03/29/14 1820 03/30/14 0420  AST 13 311*  ALT 11 257*  ALKPHOS 104 187*  BILITOT 0.3 0.5  PROT 7.5 6.1  ALBUMIN 4.4 3.4*    Recent Labs Lab 03/29/14 1820  LIPASE 26   No results found for this basename: AMMONIA,  in the last 168 hours CBC:  Recent Labs Lab 03/29/14 1820 03/30/14 0420  WBC 20.2* 20.1*  NEUTROABS 17.5*  --   HGB 14.5 12.8  HCT 44.3 39.6  MCV 87.2 87.0  PLT 306 269   Cardiac Enzymes: No results found for this basename: CKTOTAL, CKMB, CKMBINDEX, TROPONINI,  in the last 168 hours BNP (last 3 results) No results found for this basename: PROBNP,  in the last 8760 hours CBG:  Recent Labs Lab 03/30/14 0801  GLUCAP 109*    No results found for this or any previous visit (from the past 240 hour(s)).   Studies: Ct Abdomen Pelvis W Contrast  03/29/2014   CLINICAL DATA:  Lower abdomen pain, nausea and vomiting for 1 day, history of gastric bypass  EXAM: CT ABDOMEN AND PELVIS WITH CONTRAST  TECHNIQUE: Multidetector CT imaging of the abdomen and pelvis  was performed using the standard protocol following bolus administration of intravenous contrast.  CONTRAST:  187mL OMNIPAQUE IOHEXOL 300 MG/ML  SOLN  COMPARISON:  None.  FINDINGS: A calcified granuloma is present in the anterior right lower lobe laterally. No other abnormality is seen through the lung bases. The liver enhances with no focal abnormality and no ductal dilatation is seen. Surgical clips are present from prior cholecystectomy.  The pancreas is within normal limits in size and the pancreatic duct is not dilated. However just posterior to the pancreatic head there is a rounded inhomogeneous low-attenuation structure of 1.7 x 2.0 cm. There is indistinctness surrounding this lesion which  could indicate inflammation. This could represent an exophytic lesion emanating from the junction of the pancreatic head and uncinate process. Therefore correlation with pancreatic enzymes is recommended. Another consideration is that of an inflamed duodenal diverticulum. If possible in this patient with history of gastric bypass, endoscopic ultrasound may be helpful to assess further.  The adrenal glands and spleen are unremarkable with several calcified splenic granulomas present from prior granulomatous disease. The kidneys enhance and there is a cyst emanating from the upper pole of the right kidney medially. On delayed images the pelvocaliceal systems are unremarkable. The abdominal aorta is normal in caliber. No adenopathy is seen.  The urinary bladder is not well distended but no abnormality is seen. The uterus appears to have been resected. No adnexal lesion is seen. There are multiple diverticula scattered throughout the entire colon concentrated primarily in the descending and rectosigmoid colon. The terminal ileum is unremarkable, as is the appendix. There is no evidence of appendicitis. No fluid is seen within the pelvis. The lumbar vertebrae are in normal alignment with mild degenerative disc disease at L2-3.  IMPRESSION: 1. Inhomogeneous rounded lesion adjacent to the descending duodenum and ampulla of 1.7 x 2.0 cm. Consider an exophytic pancreatic lesion, inflamed descending duodenum diverticulum or less likely an ampullary lesion. Endoscopic ultrasound is recommended if possible in this patient with history of gastric bypass. 2. Multiple colonic diverticula. 3. Changes of prior granulomatous disease. 4. The appendix and terminal ileum appear normal.   Electronically Signed   By: Ivar Drape M.D.   On: 03/29/2014 15:48    Scheduled Meds: . amphetamine-dextroamphetamine  20 mg Oral BID WC  . ciprofloxacin  400 mg Intravenous Q12H  . cyclobenzaprine  10 mg Oral QHS  . folic acid  1 mg Oral Daily   . heparin  5,000 Units Subcutaneous 3 times per day  . lamoTRIgine  200 mg Oral QHS  . levothyroxine  100 mcg Oral QHS  . lithium carbonate  300 mg Oral Daily  . lithium carbonate  600 mg Oral QHS  . LORazepam  0.5 mg Oral Daily  . LORazepam  1 mg Oral QHS  . lurasidone  40 mg Oral Daily  . metronidazole  500 mg Intravenous 3 times per day  . multivitamin with minerals  1 tablet Oral Daily  . pantoprazole  80 mg Oral Daily  . thiamine  100 mg Oral Daily  . traZODone  300 mg Oral QHS   Continuous Infusions: . sodium chloride 50 mL/hr at 03/29/14 2303    Principal Problem:   Diverticula of small intestine Active Problems:   Bipolar 1 disorder   Fibromyalgia   Diverticulitis of duodenum  Time spent: 46min   Jasnoor Trussell, [facility] Pager [phone]. If 7PM-7AM, please contact night-coverage at [email or web address], password [facility] 03/30/2014, 11:11 AM  LOS: 1  day

## 2014-03-30 NOTE — Consult Note (Signed)
Reason for Consult:  Abdominal pain with abnormal CT. Referring Physician:  Dr. Marylu Lund  Theresa Clay is an 58 y.o. female.  HPI: 58 y/o female who presented to her PCP yesterday reporting she awoke with pain RUQ.  It came on acutely 2 nights ago.  Nothing made it better or worse.  She could not get comfortable.  She gave herself an enema with good results but it did not lessen her pain symptoms.  She reports some nausea with pain, but no vomiting.  No weight loss, no fever with pain.  She has never had this discomfort before.  Nothing but morphine has made it better, and she has not eaten since she started having pain.  This started about 48 hours ago.  She saw her PCP Dr. Larose Hires FP who obtained a CT scan and labs.  We do not have the labs, but CT scan shows:  Inhomogeneous rounded lesion adjacent to the descending duodenum and ampulla of 1.7 x 2.0 cm. Consider an exophytic pancreatic  lesion, inflamed descending duodenum diverticulum or less likely an  ampullary lesion. Labs yesterday shows a normal CMP but a WBC of 20.2 K.  She was seen by [facility] medicine and admitted.  Repeat labs today shows AST 311, ALT 257.  Normal bilirubins both studies and normal lipase on admit, (26).  Her WBC remains elevated.  GI did not think she was a candidate for EUS because of the bypass.  We are ask to see.   Past Medical History  Diagnosis Date  . Hx of radiation therapy 04/04/08-05/17/08    left breast, tumor bed/prior lumpectomy  . Bipolar 1 disorder -  Most of her life    . Fibromyalgia  -  Affects most of her joints, may last 4-5 days and then resolve for a few weeks.   . Osteoarthritis Left knee replacement   . Hypothyroidism   . Depression/anxiety    Tobacco use   . GERD (gastroesophageal reflux disease)   . Hyperlipemia   . Esophageal reflux   . 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 Breast cancer 12/13/07    left , ER/PR +    Past Surgical History  Procedure Laterality Date  . Knee surgery   10/2011    reconstruction  . Gastric bypass 2003 or 2005/[facility] in [place]   . Tonsillectomy    . Cholecystectomy    . Total abdominal hysterectomy w/ bilateral salpingoophorectomy    . Knee arthroscopy Left 08/09/2012    Procedure: ARTHROSCOPY LEFT KNEE chondroplasty;  Surgeon: Hessie Dibble, MD;  Location: [place];  Service: Orthopedics;  Laterality: Left;    Family History  Problem Relation Age of Onset  . Breast cancer Mother 55  . Melanoma Mother   . Dementia Father   . Cancer - Prostate Maternal Grandfather    2 brothers and 2 sisters in good health  Social History:  reports that she has been smoking.  She has never used smokeless tobacco. She reports that she does not drink alcohol or use illicit drugs. On disability for fibromyalgia and Bipolar disorder  Allergies:  Allergies  Allergen Reactions  . Darvocet [Propoxyphene N-Acetaminophen] Anaphylaxis  . Penicillins Anaphylaxis  . Codeine Other (See Comments)    "double over in pain" per pt  . Hydrocodone Nausea And Vomiting    pain  . Oxycodone Nausea And Vomiting    pain  . Simvastatin     myalgias    Medications:  Prior to Admission:  Prescriptions prior to admission  Medication Sig Dispense Refill  . albuterol (PROVENTIL HFA;VENTOLIN HFA) 108 (90 BASE) MCG/ACT inhaler Inhale 2 puffs into the lungs every 4 (four) hours as needed for wheezing. 2 puffs as needed      . amphetamine-dextroamphetamine (ADDERALL XR) 20 MG 24 hr capsule Take 20 mg by mouth 2 (two) times daily.      . CELEBREX 200 MG capsule 200 mg daily.       Marland Kitchen lamoTRIgine (LAMICTAL) 200 MG tablet Take 200 mg by mouth at bedtime.      Marland Kitchen levothyroxine (SYNTHROID, LEVOTHROID) 100 MCG tablet Take 100 mcg by mouth at bedtime.      Marland Kitchen LIDODERM 5 % Place 1-3 patches onto the skin every 12 (twelve) hours as needed (pain).       Marland Kitchen lithium carbonate 300 MG capsule Take 300-600 mg by mouth 2 (two) times daily. One tablet in am two  tablets pm      . LORazepam (ATIVAN) 1 MG tablet Take 0.5-1 mg by mouth 2 (two) times daily. Take 2m at night and 0.553min the morning      . lurasidone (LATUDA) 80 MG TABS Take by mouth daily. One tablet  With food once daily      . Multiple Vitamin (MULTIVITAMIN) tablet Take 1 tablet by mouth daily.      . Marland Kitchenmeprazole (PRILOSEC) 40 MG capsule 40 mg daily.       . Marland KitcheniZANidine (ZANAFLEX) 4 MG capsule Take 4 mg by mouth daily. Take daily      . traZODone (DESYREL) 150 MG tablet Take 300 mg by mouth at bedtime.      . valACYclovir (VALTREX) 500 MG tablet Take 500 mg by mouth daily. One tablet every 12 hours as needed      . UNABLE TO FIND B12 injections one or twice weekly       Scheduled: . amphetamine-dextroamphetamine  20 mg Oral BID WC  . ciprofloxacin  400 mg Intravenous Q12H  . cyclobenzaprine  10 mg Oral QHS  . folic acid  1 mg Oral Daily  . heparin  5,000 Units Subcutaneous 3 times per day  . lamoTRIgine  200 mg Oral QHS  . levothyroxine  100 mcg Oral QHS  . lithium carbonate  300 mg Oral Daily  . lithium carbonate  600 mg Oral QHS  . LORazepam  0.5 mg Oral Daily  . LORazepam  1 mg Oral QHS  . lurasidone  40 mg Oral Daily  . metronidazole  500 mg Intravenous 3 times per day  . multivitamin with minerals  1 tablet Oral Daily  . pantoprazole  80 mg Oral Daily  . thiamine  100 mg Oral Daily  . traZODone  300 mg Oral QHS   Continuous: . sodium chloride 50 mL/hr at 03/29/14 2303   PRXHB:ZJIRCVELFHYDROmorphone (DILAUDID) injection, ondansetron (ZOFRAN) IV, ondansetron Anti-infectives   Start     Dose/Rate Route Frequency Ordered Stop   03/30/14 0800  ciprofloxacin (CIPRO) IVPB 400 mg     400 mg 200 mL/hr over 60 Minutes Intravenous Every 12 hours 03/30/14 0419     03/30/14 0430  metroNIDAZOLE (FLAGYL) IVPB 500 mg     500 mg 100 mL/hr over 60 Minutes Intravenous 3 times per day 03/30/14 0413     03/29/14 2015  ciprofloxacin (CIPRO) IVPB 400 mg     400 mg 200 mL/hr over 60  Minutes Intravenous  Once 03/29/14 2014 03/29/14  2135   03/29/14 2015  metroNIDAZOLE (FLAGYL) IVPB 500 mg  Status:  Discontinued     500 mg 100 mL/hr over 60 Minutes Intravenous  Once 03/29/14 2014 03/29/14 2227      Results for orders placed during the hospital encounter of 03/29/14 (from the past 48 hour(s))  CBC WITH DIFFERENTIAL     Status: Abnormal   Collection Time    03/29/14  6:20 PM      Result Value Ref Range   WBC 20.2 (*) 4.0 - 10.5 K/uL   RBC 5.08  3.87 - 5.11 MIL/uL   Hemoglobin 14.5  12.0 - 15.0 g/dL   HCT 44.3  36.0 - 46.0 %   MCV 87.2  78.0 - 100.0 fL   MCH 28.5  26.0 - 34.0 pg   MCHC 32.7  30.0 - 36.0 g/dL   RDW 13.9  11.5 - 15.5 %   Platelets 306  150 - 400 K/uL   Neutrophils Relative % 87 (*) 43 - 77 %   Neutro Abs 17.5 (*) 1.7 - 7.7 K/uL   Lymphocytes Relative 7 (*) 12 - 46 %   Lymphs Abs 1.4  0.7 - 4.0 K/uL   Monocytes Relative 5  3 - 12 %   Monocytes Absolute 1.1 (*) 0.1 - 1.0 K/uL   Eosinophils Relative 1  0 - 5 %   Eosinophils Absolute 0.2  0.0 - 0.7 K/uL   Basophils Relative 0  0 - 1 %   Basophils Absolute 0.0  0.0 - 0.1 K/uL  COMPREHENSIVE METABOLIC PANEL     Status: Abnormal   Collection Time    03/29/14  6:20 PM      Result Value Ref Range   Sodium 135 (*) 137 - 147 mEq/L   Potassium 4.2  3.7 - 5.3 mEq/L   Chloride 100  96 - 112 mEq/L   CO2 22  19 - 32 mEq/L   Glucose, Bld 107 (*) 70 - 99 mg/dL   BUN 7  6 - 23 mg/dL   Creatinine, Ser 0.88  0.50 - 1.10 mg/dL   Calcium 10.1  8.4 - 10.5 mg/dL   Total Protein 7.5  6.0 - 8.3 g/dL   Albumin 4.4  3.5 - 5.2 g/dL   AST 13  0 - 37 U/L   ALT 11  0 - 35 U/L   Alkaline Phosphatase 104  39 - 117 U/L   Total Bilirubin 0.3  0.3 - 1.2 mg/dL   GFR calc non Af Amer 72 (*) >90 mL/min   GFR calc Af Amer 84 (*) >90 mL/min   Comment: (NOTE)     The eGFR has been calculated using the CKD EPI equation.     This calculation has not been validated in all clinical situations.     eGFR's persistently <90 mL/min  signify possible Chronic Kidney     Disease.   Anion gap 13  5 - 15  LIPASE, BLOOD     Status: None   Collection Time    03/29/14  6:20 PM      Result Value Ref Range   Lipase 26  11 - 59 U/L  URINALYSIS, ROUTINE W REFLEX MICROSCOPIC     Status: Abnormal   Collection Time    03/29/14  6:30 PM      Result Value Ref Range   Color, Urine YELLOW  YELLOW   APPearance CLEAR  CLEAR   Specific Gravity, Urine >1.046 (*) 1.005 - 1.030  pH 7.0  5.0 - 8.0   Glucose, UA NEGATIVE  NEGATIVE mg/dL   Hgb urine dipstick SMALL (*) NEGATIVE   Bilirubin Urine NEGATIVE  NEGATIVE   Ketones, ur NEGATIVE  NEGATIVE mg/dL   Protein, ur NEGATIVE  NEGATIVE mg/dL   Urobilinogen, UA 1.0  0.0 - 1.0 mg/dL   Nitrite NEGATIVE  NEGATIVE   Leukocytes, UA NEGATIVE  NEGATIVE  URINE MICROSCOPIC-ADD ON     Status: None   Collection Time    03/29/14  6:30 PM      Result Value Ref Range   Squamous Epithelial / LPF RARE  RARE   WBC, UA 0-2  <3 WBC/hpf   RBC / HPF 3-6  <3 RBC/hpf   Bacteria, UA RARE  RARE  I-STAT TROPOININ, ED     Status: None   Collection Time    03/29/14  6:31 PM      Result Value Ref Range   Troponin i, poc 0.01  0.00 - 0.08 ng/mL   Comment 3            Comment: Due to the release kinetics of cTnI,     a negative result within the first hours     of the onset of symptoms does not rule out     myocardial infarction with certainty.     If myocardial infarction is still suspected,     repeat the test at appropriate intervals.  TSH     Status: None   Collection Time    03/30/14  4:20 AM      Result Value Ref Range   TSH 0.722  0.350 - 4.500 uIU/mL   Comment: Performed at [place] PANEL     Status: Abnormal   Collection Time    03/30/14  4:20 AM      Result Value Ref Range   Sodium 134 (*) 137 - 147 mEq/L   Potassium 3.9  3.7 - 5.3 mEq/L   Chloride 101  96 - 112 mEq/L   CO2 21  19 - 32 mEq/L   Glucose, Bld 138 (*) 70 - 99 mg/dL   BUN 5 (*) 6 - 23  mg/dL   Creatinine, Ser 0.70  0.50 - 1.10 mg/dL   Calcium 9.1  8.4 - 10.5 mg/dL   Total Protein 6.1  6.0 - 8.3 g/dL   Albumin 3.4 (*) 3.5 - 5.2 g/dL   AST 311 (*) 0 - 37 U/L   ALT 257 (*) 0 - 35 U/L   Alkaline Phosphatase 187 (*) 39 - 117 U/L   Total Bilirubin 0.5  0.3 - 1.2 mg/dL   GFR calc non Af Amer >90  >90 mL/min   GFR calc Af Amer >90  >90 mL/min   Comment: (NOTE)     The eGFR has been calculated using the CKD EPI equation.     This calculation has not been validated in all clinical situations.     eGFR's persistently <90 mL/min signify possible Chronic Kidney     Disease.   Anion gap 12  5 - 15  CBC     Status: Abnormal   Collection Time    03/30/14  4:20 AM      Result Value Ref Range   WBC 20.1 (*) 4.0 - 10.5 K/uL   RBC 4.55  3.87 - 5.11 MIL/uL   Hemoglobin 12.8  12.0 - 15.0 g/dL   HCT 39.6  36.0 - 46.0 %   MCV  87.0  78.0 - 100.0 fL   MCH 28.1  26.0 - 34.0 pg   MCHC 32.3  30.0 - 36.0 g/dL   RDW 14.1  11.5 - 15.5 %   Platelets 269  150 - 400 K/uL  GLUCOSE, CAPILLARY     Status: Abnormal   Collection Time    03/30/14  8:01 AM      Result Value Ref Range   Glucose-Capillary 109 (*) 70 - 99 mg/dL   Comment 1 Documented in Chart     Comment 2 Notify RN      Ct Abdomen Pelvis W Contrast  03/29/2014   CLINICAL DATA:  Lower abdomen pain, nausea and vomiting for 1 day, history of gastric bypass  EXAM: CT ABDOMEN AND PELVIS WITH CONTRAST  TECHNIQUE: Multidetector CT imaging of the abdomen and pelvis was performed using the standard protocol following bolus administration of intravenous contrast.  CONTRAST:  129m OMNIPAQUE IOHEXOL 300 MG/ML  SOLN  COMPARISON:  None.  FINDINGS: A calcified granuloma is present in the anterior right lower lobe laterally. No other abnormality is seen through the lung bases. The liver enhances with no focal abnormality and no ductal dilatation is seen. Surgical clips are present from prior cholecystectomy.  The pancreas is within normal limits in  size and the pancreatic duct is not dilated. However just posterior to the pancreatic head there is a rounded inhomogeneous low-attenuation structure of 1.7 x 2.0 cm. There is indistinctness surrounding this lesion which could indicate inflammation. This could represent an exophytic lesion emanating from the junction of the pancreatic head and uncinate process. Therefore correlation with pancreatic enzymes is recommended. Another consideration is that of an inflamed duodenal diverticulum. If possible in this patient with history of gastric bypass, endoscopic ultrasound may be helpful to assess further.  The adrenal glands and spleen are unremarkable with several calcified splenic granulomas present from prior granulomatous disease. The kidneys enhance and there is a cyst emanating from the upper pole of the right kidney medially. On delayed images the pelvocaliceal systems are unremarkable. The abdominal aorta is normal in caliber. No adenopathy is seen.  The urinary bladder is not well distended but no abnormality is seen. The uterus appears to have been resected. No adnexal lesion is seen. There are multiple diverticula scattered throughout the entire colon concentrated primarily in the descending and rectosigmoid colon. The terminal ileum is unremarkable, as is the appendix. There is no evidence of appendicitis. No fluid is seen within the pelvis. The lumbar vertebrae are in normal alignment with mild degenerative disc disease at L2-3.  IMPRESSION: 1. Inhomogeneous rounded lesion adjacent to the descending duodenum and ampulla of 1.7 x 2.0 cm. Consider an exophytic pancreatic lesion, inflamed descending duodenum diverticulum or less likely an ampullary lesion. Endoscopic ultrasound is recommended if possible in this patient with history of gastric bypass. 2. Multiple colonic diverticula. 3. Changes of prior granulomatous disease. 4. The appendix and terminal ileum appear normal.   Electronically Signed   By: PIvar DrapeM.D.   On: 03/29/2014 15:48    Review of Systems  Constitutional: Negative.   HENT: Negative for congestion, ear discharge, ear pain, hearing loss, nosebleeds, sore throat and tinnitus.   Eyes: Negative.        Glasses  Respiratory: Negative.  Negative for stridor.   Cardiovascular: Positive for orthopnea (occasional). Negative for chest pain, palpitations, claudication, leg swelling and PND.  Gastrointestinal: Positive for heartburn (well controlled with PPI), nausea, abdominal pain (10/10RUQ, nothing  aside from morphine helps.  nothing makes it better or worse.) and constipation (chronic constipation, take an enema monthly for constipation.). Negative for vomiting, diarrhea, blood in stool and melena.       Laparoscopy scars well healed.  Genitourinary: Negative.   Musculoskeletal: Positive for joint pain, myalgias and neck pain.  Skin: Negative.   Neurological: Positive for dizziness (daily with Bipolar meds) and headaches (2-3 per week). Negative for tingling, tremors, sensory change, speech change, focal weakness, seizures and loss of consciousness.  Endo/Heme/Allergies: Negative.   Psychiatric/Behavioral: Positive for depression. Negative for substance abuse. The patient is nervous/anxious.        Bipolar for as long as she can remember.   Blood pressure 121/69, pulse 86, temperature 97.6 F (36.4 C), temperature source Oral, resp. rate 18, height 5' 2"  (1.575 m), weight 72 kg (158 lb 11.7 oz), SpO2 100.00%. Physical Exam  Constitutional: She is oriented to person, place, and time. She appears well-developed and well-nourished. No distress.  She is falling asleep during interview.  HENT:  Head: Normocephalic and atraumatic.  Nose: Nose normal.  Eyes: Conjunctivae and EOM are normal. Right eye exhibits no discharge. Left eye exhibits no discharge. No scleral icterus.  Neck: Normal range of motion. Neck supple. No JVD present. No tracheal deviation present. No thyromegaly  present.  Cardiovascular: Normal rate, regular rhythm, normal heart sounds and intact distal pulses.   No murmur heard. Respiratory: Effort normal and breath sounds normal. No respiratory distress. She has no wheezes. She has no rales. She exhibits no tenderness.  GI: Soft. Bowel sounds are normal. She exhibits no distension and no mass. There is tenderness (tender RUQ and  with palpation of the RLQ she has pain but in the RUQ.). There is no rebound and no guarding.  Musculoskeletal: Normal range of motion. She exhibits no edema and no tenderness.  Lymphadenopathy:    She has no cervical adenopathy.  Neurological: She is alert and oriented to person, place, and time. No cranial nerve deficit.  Skin: Skin is warm and dry. No rash noted. She is not diaphoretic. No erythema. No pallor.  Psychiatric: She has a normal mood and affect. Her behavior is normal. Judgment and thought content normal.    Assessment/Plan: 1.  Acute Abdominal pain RUQ with abnormal CT 2.  Chronic constipation with medication usage 3.  S/p Gastric bypass/cholecystectomy/hysterectomy 3.  Bipolar disorder 4.  Depression/anxiety 5.  Fibromyalgia  6.  Tobacco use 7.  Hypothyroid 8.  GERD 9.  Hx of left breast cancer with lumpectomy and radiation rx. 10.Hypersomnia 11. Diverticulosis on CT. 12.  Multiple medicine allergies  Plan:  Dr. Hassell Done will review the CT, I will repeat labs and check a CA 19-9 tomorrow.  She has a WBC count and she has elevated LFT's.  We will follow with you. She is complaining of pain coming back while I was in to see her, but she also fell asleep while talking with her.     Saranne Crislip 03/30/2014, 3:21 PM

## 2014-03-30 NOTE — Progress Notes (Addendum)
Pharmacy will sign-off Cipro dosing.   Renal function is normal so dose-adjustments are not likely to be needed. Please re-consult if renal function changes. Consider change to PO abx when appropriate.   Romeo Rabon, PharmD, pager [phone]. 03/30/2014,8:27 AM.

## 2014-03-30 NOTE — Care Management Note (Signed)
    Page 1 of 1   03/30/2014     4:09:43 PM CARE MANAGEMENT NOTE 03/30/2014  Patient:  Theresa Clay, Theresa Clay   Account Number:  [id number]  Date Initiated:  03/30/2014  Documentation initiated by:  Aolanis Crispen  Subjective/Objective Assessment:   abd pain with elevated wbc and poor p.o. intake     Action/Plan:   home when stable   Anticipated DC Date:  04/02/2014   Anticipated DC Plan:  HOME/SELF CARE  In-house referral  NA      DC Planning Services  CM consult  NA      PAC Choice  NA   Choice offered to / List presented to:  NA      DME agency  NA        [place] agency  NA   Status of service:  In process, will continue to follow Medicare Important Message given?   (If response is "NO", the following Medicare IM given date fields will be blank) Date Medicare IM given:   Medicare IM given by:   Date Additional Medicare IM given:   Additional Medicare IM given by:    Discharge Disposition:    Per UR Regulation:  Reviewed for med. necessity/level of care/duration of stay  If discussed at [place] of Stay Meetings, dates discussed:    Comments:  [id number]/Priscillia Fouch Rosana Hoes, RN, BSN, CCM Chart reviewed. Discharge needs and patient's stay to be reviewed and followed by case manager.

## 2014-03-30 NOTE — Progress Notes (Signed)
ANTIBIOTIC CONSULT NOTE - INITIAL  Pharmacy Consult for Cipro Indication: Diverticulitis  Allergies  Allergen Reactions  . Darvocet [Propoxyphene N-Acetaminophen] Anaphylaxis  . Penicillins Anaphylaxis  . Codeine Other (See Comments)    "double over in pain" per pt  . Hydrocodone Nausea And Vomiting    pain  . Oxycodone Nausea And Vomiting    pain  . Simvastatin     myalgias    Patient Measurements: Height: 5\' 2"  (157.5 cm) Weight: 158 lb 11.7 oz (72 kg) IBW/kg (Calculated) : 50.1 Adjusted Body Weight:   Vital Signs: Temp: 97.6 F (36.4 C) (10/30 0349) Temp Source: Oral (10/30 0349) BP: 121/69 mmHg (10/30 0349) Pulse Rate: 86 (10/30 0349) Intake/Output from previous day: 10/29 0701 - 10/30 0700 In: -  Out: 650 [Urine:650] Intake/Output from this shift: Total I/O In: -  Out: 650 [Urine:650]  Labs:  Recent Labs  03/29/14 1820 03/30/14 0420  WBC 20.2* 20.1*  HGB 14.5 12.8  PLT 306 269  CREATININE 0.88  --    Estimated Creatinine Clearance: 66.4 ml/min (by C-G formula based on Cr of 0.88). No results found for this basename: VANCOTROUGH, VANCOPEAK, VANCORANDOM, GENTTROUGH, GENTPEAK, GENTRANDOM, TOBRATROUGH, TOBRAPEAK, TOBRARND, AMIKACINPEAK, AMIKACINTROU, AMIKACIN,  in the last 72 hours   Microbiology: No results found for this or any previous visit (from the past 720 hour(s)).  Medical History: Past Medical History  Diagnosis Date  . Hx of radiation therapy 04/04/08-05/17/08    left breast, tumor bed  . Bipolar 1 disorder   . Fibromyalgia   . Osteoarthritis   . Hypothyroidism   . Depression   . Anxiety   . GERD (gastroesophageal reflux disease)   . Hyperlipemia   . Esophageal reflux   . 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 Breast cancer 12/13/07    left , ER/PR +    Medications:  Anti-infectives   Start     Dose/Rate Route Frequency Ordered Stop   03/30/14 0800  ciprofloxacin (CIPRO) IVPB 400 mg     400 mg 200 mL/hr over 60 Minutes Intravenous Every  12 hours 03/30/14 0419     03/30/14 0430  metroNIDAZOLE (FLAGYL) IVPB 500 mg     500 mg 100 mL/hr over 60 Minutes Intravenous 3 times per day 03/30/14 0413     03/29/14 2015  ciprofloxacin (CIPRO) IVPB 400 mg     400 mg 200 mL/hr over 60 Minutes Intravenous  Once 03/29/14 2014 03/29/14 2135   03/29/14 2015  metroNIDAZOLE (FLAGYL) IVPB 500 mg  Status:  Discontinued     500 mg 100 mL/hr over 60 Minutes Intravenous  Once 03/29/14 2014 03/29/14 2227     Assessment: Patient with Intra-abdominal Infection.  First dose of antibiotics already given.  Goal of Therapy:  Cipro dosed based on patient weight and renal function   Plan:  Follow up culture results Cipro 400mg  iv q12hr  Tyler Deis, Shea Stakes [place] 03/30/2014,4:57 AM

## 2014-03-31 DIAGNOSIS — Z923 Personal history of irradiation: Secondary | ICD-10-CM

## 2014-03-31 LAB — COMPREHENSIVE METABOLIC PANEL
ALT: 120 U/L — ABNORMAL HIGH (ref 0–35)
AST: 37 U/L (ref 0–37)
Albumin: 3 g/dL — ABNORMAL LOW (ref 3.5–5.2)
Alkaline Phosphatase: 155 U/L — ABNORMAL HIGH (ref 39–117)
Anion gap: 11 (ref 5–15)
BUN: 5 mg/dL — ABNORMAL LOW (ref 6–23)
CO2: 22 mEq/L (ref 19–32)
Calcium: 9.5 mg/dL (ref 8.4–10.5)
Chloride: 100 mEq/L (ref 96–112)
Creatinine, Ser: 0.72 mg/dL (ref 0.50–1.10)
GFR calc Af Amer: >90 mL/min (ref >90)
GFR calc non Af Amer: >90 mL/min (ref >90)
Glucose, Bld: 126 mg/dL — ABNORMAL HIGH (ref 70–99)
Potassium: 3.8 mEq/L (ref 3.7–5.3)
Sodium: 133 mEq/L — ABNORMAL LOW (ref 137–147)
Total Bilirubin: 0.4 mg/dL (ref 0.3–1.2)
Total Protein: 5.9 g/dL — ABNORMAL LOW (ref 6.0–8.3)

## 2014-03-31 LAB — HEPATITIS PANEL, ACUTE
HCV Ab: NEGATIVE
Hep A IgM: NONREACTIVE
Hep B C IgM: NONREACTIVE
Hepatitis B Surface Ag: NEGATIVE

## 2014-03-31 LAB — CBC
HCT: 37.9 % (ref 36.0–46.0)
Hemoglobin: 12 g/dL (ref 12.0–15.0)
MCH: 27.9 pg (ref 26.0–34.0)
MCHC: 31.7 g/dL (ref 30.0–36.0)
MCV: 88.1 fL (ref 78.0–100.0)
Platelets: 242 10*3/uL (ref 150–400)
RBC: 4.3 MIL/uL (ref 3.87–5.11)
RDW: 14.3 % (ref 11.5–15.5)
WBC: 18.6 10*3/uL — ABNORMAL HIGH (ref 4.0–10.5)

## 2014-03-31 LAB — GLUCOSE, CAPILLARY: Glucose-Capillary: 110 mg/dL — ABNORMAL HIGH (ref 70–99)

## 2014-03-31 LAB — CANCER ANTIGEN 19-9: CA 19-9: 11.5 U/mL — ABNORMAL LOW (ref <35.0)

## 2014-03-31 LAB — LIPASE, BLOOD: Lipase: 27 U/L (ref 11–59)

## 2014-03-31 NOTE — Progress Notes (Signed)
Subjective: Abdominal pain much improved.  Objective: Vital signs in last 24 hours: Temp:  [98.2 F (36.8 C)-98.8 F (37.1 C)] 98.2 F (36.8 C) (10/31 0529) Pulse Rate:  [86-98] 98 (10/31 0529) Resp:  [16-17] 16 (10/31 0529) BP: (113-125)/(61-69) 113/65 mmHg (10/31 0529) SpO2:  [92 %-99 %] 93 % (10/31 0529) Weight change:  Last BM Date:  (1029)  PE: GEN:  Somnolent, NAD ABD:  Soft, non-tender.  No longer having right upper quadrant pain   Labs: CBC    Component Value Date/Time   WBC 18.6* 03/31/2014 0505   WBC 8.1 01/17/2013 0956   RBC 4.30 03/31/2014 0505   RBC 4.95 01/17/2013 0956   HGB 12.0 03/31/2014 0505   HGB 13.5 01/17/2013 0956   HCT 37.9 03/31/2014 0505   HCT 43.2 01/17/2013 0956   PLT 242 03/31/2014 0505   PLT 268 01/17/2013 0956   MCV 88.1 03/31/2014 0505   MCV 87.3 01/17/2013 0956   MCH 27.9 03/31/2014 0505   MCH 27.3 01/17/2013 0956   MCHC 31.7 03/31/2014 0505   MCHC 31.3* 01/17/2013 0956   RDW 14.3 03/31/2014 0505   RDW 15.5* 01/17/2013 0956   LYMPHSABS 1.4 03/29/2014 1820   LYMPHSABS 1.8 01/17/2013 0956   MONOABS 1.1* 03/29/2014 1820   MONOABS 0.4 01/17/2013 0956   EOSABS 0.2 03/29/2014 1820   EOSABS 0.3 01/17/2013 0956   BASOSABS 0.0 03/29/2014 1820   BASOSABS 0.0 01/17/2013 0956   CMP     Component Value Date/Time   NA 133* 03/31/2014 0505   NA 140 01/17/2013 0956   K 3.8 03/31/2014 0505   K 4.4 01/17/2013 0956   CL 100 03/31/2014 0505   CO2 22 03/31/2014 0505   CO2 24 01/17/2013 0956   GLUCOSE 126* 03/31/2014 0505   GLUCOSE 98 01/17/2013 0956   BUN 5* 03/31/2014 0505   BUN 8.8 01/17/2013 0956   CREATININE 0.72 03/31/2014 0505   CREATININE 1.0 01/17/2013 0956   CALCIUM 9.5 03/31/2014 0505   CALCIUM 9.7 01/17/2013 0956   PROT 5.9* 03/31/2014 0505   PROT 6.8 01/17/2013 0956   ALBUMIN 3.0* 03/31/2014 0505   ALBUMIN 3.9 01/17/2013 0956   AST 37 03/31/2014 0505   AST 15 01/17/2013 0956   ALT 120* 03/31/2014 0505   ALT 15 01/17/2013 0956   ALKPHOS 155*  03/31/2014 0505   ALKPHOS 96 01/17/2013 0956   BILITOT 0.4 03/31/2014 0505   BILITOT 0.46 01/17/2013 0956   GFRNONAA >90 03/31/2014 0505   GFRAA >90 03/31/2014 0505   Assessment:  1.  Abdominal pain, essentially resolved. 2.  Abnormal CT abdomen, ? Ampullary abnormality ? Diverticulum ?diverticulitis. 3.  Leukocytosis, improving.  Plan:  1.  Continue intravenous antibiotics. 2.  Continue her diet, since she seems to be doing well with it at the present time. 3.  Doubt utility of surgical intervention at the present time, (A) since I'm not clear the "abnormality" seen on CT scan is clinically significant and (B) since she is significantly improving. 4.  If continues to feel well tomorrow, could consider transition to oral antibiotics and consider discharge in the next day or two. 5.  Case discussed with Dr. Alphonsa Overall.   Theresa Clay 03/31/2014, 1:17 PM

## 2014-03-31 NOTE — Progress Notes (Signed)
[facility] PROGRESS NOTE  Theresa Clay JYN:[id number] DOB: 13-Nov-1955 DOA: 03/29/2014 PCP: Hulen Shouts, MD  Assessment/Plan: 1. Abd pain 1. Clinically improving with less abd pain and resolving leukocytosis 2. Cont with analgesics as tolerated 3. GI and Surgery following. Greatly appreciate input 4. Continue cipro/flagyl for now 5. Advance diet as tolerated 6. Consideration for transition to PO abx in next day or so and possible d/c at that point 2. Inhomogeneous intra-abdominal lesion on CT 1. GI and General Surgery per above 2. Difficult situation from a diagnostic procedure standpoint given intra-abdominal anatomical changes from previous gastric bypass 3. Abx and supportive care for now 3. Bipolar d/o 1. Stable 4. Fibromyalgia 1. Analgesics as needed, avoiding long-term narcotics 5. DVT prophylaxis 1. heparin  Code Status: Full Family Communication: Pt in room Disposition Plan: Penidng  Consultants:  GI  Procedures:    Antibiotics:  Ciprofloxacin 10/29>>>  Flagyl  10/29>>>  HPI/Subjective: Pain improving. No acute events noted overnight  Objective: Filed Vitals:   03/30/14 0349 03/30/14 1524 03/30/14 2157 03/31/14 0529  BP: 121/69 122/61 125/69 113/65  Pulse: 86 86 88 98  Temp: 97.6 F (36.4 C) 98.3 F (36.8 C) 98.8 F (37.1 C) 98.2 F (36.8 C)  TempSrc: Oral Oral Oral Oral  Resp: 18 17 16 16   Height:      Weight:      SpO2: 100% 99% 92% 93%    Intake/Output Summary (Last 24 hours) at 03/31/14 1354 Last data filed at 03/31/14 0900  Gross per 24 hour  Intake 2530.83 ml  Output    650 ml  Net 1880.83 ml   Filed Weights   03/29/14 2225  Weight: 72 kg (158 lb 11.7 oz)    Exam:   General:  Awake, in nad  Cardiovascular: regular, s1, s2  Respiratory: normal resp effort, no wheezing  Abdomen: soft, tenderness generally over abd  Musculoskeletal: perfused, no clubbing   Data Reviewed: Basic Metabolic  Panel:  Recent Labs Lab 03/29/14 1820 03/30/14 0420 03/31/14 0505  NA 135* 134* 133*  K 4.2 3.9 3.8  CL 100 101 100  CO2 22 21 22   GLUCOSE 107* 138* 126*  BUN 7 5* 5*  CREATININE 0.88 0.70 0.72  CALCIUM 10.1 9.1 9.5   Liver Function Tests:  Recent Labs Lab 03/29/14 1820 03/30/14 0420 03/31/14 0505  AST 13 311* 37  ALT 11 257* 120*  ALKPHOS 104 187* 155*  BILITOT 0.3 0.5 0.4  PROT 7.5 6.1 5.9*  ALBUMIN 4.4 3.4* 3.0*    Recent Labs Lab 03/29/14 1820 03/31/14 0505  LIPASE 26 27   No results found for this basename: AMMONIA,  in the last 168 hours CBC:  Recent Labs Lab 03/29/14 1820 03/30/14 0420 03/31/14 0505  WBC 20.2* 20.1* 18.6*  NEUTROABS 17.5*  --   --   HGB 14.5 12.8 12.0  HCT 44.3 39.6 37.9  MCV 87.2 87.0 88.1  PLT 306 269 242   Cardiac Enzymes: No results found for this basename: CKTOTAL, CKMB, CKMBINDEX, TROPONINI,  in the last 168 hours BNP (last 3 results) No results found for this basename: PROBNP,  in the last 8760 hours CBG:  Recent Labs Lab 03/30/14 0801 03/31/14 0739  GLUCAP 109* 110*    No results found for this or any previous visit (from the past 240 hour(s)).   Studies: Ct Abdomen Pelvis W Contrast  03/29/2014   CLINICAL DATA:  Lower abdomen pain, nausea and vomiting for 1 day, history of gastric  bypass  EXAM: CT ABDOMEN AND PELVIS WITH CONTRAST  TECHNIQUE: Multidetector CT imaging of the abdomen and pelvis was performed using the standard protocol following bolus administration of intravenous contrast.  CONTRAST:  143mL OMNIPAQUE IOHEXOL 300 MG/ML  SOLN  COMPARISON:  None.  FINDINGS: A calcified granuloma is present in the anterior right lower lobe laterally. No other abnormality is seen through the lung bases. The liver enhances with no focal abnormality and no ductal dilatation is seen. Surgical clips are present from prior cholecystectomy.  The pancreas is within normal limits in size and the pancreatic duct is not dilated.  However just posterior to the pancreatic head there is a rounded inhomogeneous low-attenuation structure of 1.7 x 2.0 cm. There is indistinctness surrounding this lesion which could indicate inflammation. This could represent an exophytic lesion emanating from the junction of the pancreatic head and uncinate process. Therefore correlation with pancreatic enzymes is recommended. Another consideration is that of an inflamed duodenal diverticulum. If possible in this patient with history of gastric bypass, endoscopic ultrasound may be helpful to assess further.  The adrenal glands and spleen are unremarkable with several calcified splenic granulomas present from prior granulomatous disease. The kidneys enhance and there is a cyst emanating from the upper pole of the right kidney medially. On delayed images the pelvocaliceal systems are unremarkable. The abdominal aorta is normal in caliber. No adenopathy is seen.  The urinary bladder is not well distended but no abnormality is seen. The uterus appears to have been resected. No adnexal lesion is seen. There are multiple diverticula scattered throughout the entire colon concentrated primarily in the descending and rectosigmoid colon. The terminal ileum is unremarkable, as is the appendix. There is no evidence of appendicitis. No fluid is seen within the pelvis. The lumbar vertebrae are in normal alignment with mild degenerative disc disease at L2-3.  IMPRESSION: 1. Inhomogeneous rounded lesion adjacent to the descending duodenum and ampulla of 1.7 x 2.0 cm. Consider an exophytic pancreatic lesion, inflamed descending duodenum diverticulum or less likely an ampullary lesion. Endoscopic ultrasound is recommended if possible in this patient with history of gastric bypass. 2. Multiple colonic diverticula. 3. Changes of prior granulomatous disease. 4. The appendix and terminal ileum appear normal.   Electronically Signed   By: Ivar Drape M.D.   On: 03/29/2014 15:48     Scheduled Meds: . amphetamine-dextroamphetamine  20 mg Oral BID WC  . ciprofloxacin  400 mg Intravenous Q12H  . cyclobenzaprine  10 mg Oral QHS  . folic acid  1 mg Oral Daily  . heparin  5,000 Units Subcutaneous 3 times per day  . lamoTRIgine  200 mg Oral QHS  . levothyroxine  100 mcg Oral QHS  . lithium carbonate  300 mg Oral Daily  . lithium carbonate  600 mg Oral QHS  . LORazepam  0.5 mg Oral Daily  . LORazepam  1 mg Oral QHS  . lurasidone  40 mg Oral Daily  . metronidazole  500 mg Intravenous 3 times per day  . multivitamin with minerals  1 tablet Oral Daily  . pantoprazole  80 mg Oral Daily  . thiamine  100 mg Oral Daily  . traZODone  300 mg Oral QHS   Continuous Infusions: . sodium chloride 50 mL/hr at 03/31/14 0010    Principal Problem:   Diverticula of small intestine Active Problems:   Bipolar 1 disorder   Fibromyalgia   Diverticulitis of duodenum  Time spent: 96min   Caycee Wanat K  [facility] Pager [phone]. If 7PM-7AM, please contact night-coverage at [email or web address], password [facility] 03/31/2014, 1:54 PM  LOS: 2 days

## 2014-03-31 NOTE — Progress Notes (Signed)
General Surgery Note  LOS: 2 days  POD -     Assessment/Plan: 1.  Abdominal pain.  CT scan - 03/29/2014 - Inhomogeneous rounded lesion adjacent to the descending duodenum and ampulla of 1.7 x 2.0 cm. Consider an exophytic pancreatic lesion, inflamed descending duodenum diverticulum or less likely an ampullary lesion (not amendable to upper endo because of gastric bypass)   WBC 18,600 - 03/31/2104  On Cipro/Flagyl   This is an odd presentation.  I have discussed the case with Dr. Viona Gilmore. Paulita Fujita.  I'm not sure the duodenal changes are the source of her pain and WBC.  Her CT scan was otherwise unremarkable.  She is on reg diet, but taking very little.  She feels much better than on admission.  Diagnosis is unclear to me.  Will continue to follow.   2. Chronic constipation with medication usage  3. S/p Gastric bypass/cholecystectomy/hysterectomy - 2005 - [facility]  She thinks that she has lost about 150 pounds since surgery.  3. Bipolar disorder  4. Depression/anxiety  5. Fibromyalgia  6. Tobacco use  7. Hypothyroid  8. GERD  9. Hx of left breast cancer with lumpectomy and radiation rx - 2009  10. Hypersomnia  11. Diverticulosis on CT.  12. Multiple medicine allergies  13.  DVT prophylaxis - SQ Heparin  Principal Problem:   Diverticula of small intestine Active Problems:   Bipolar 1 disorder   Fibromyalgia   Diverticulitis of duodenum   Subjective:  Dry mouth and somewhat groggy.  Says she feels better.  Taking only liquids. Objective:   Filed Vitals:   03/31/14 0529  BP: 113/65  Pulse: 98  Temp: 98.2 F (36.8 C)  Resp: 16     Intake/Output from previous day:  10/30 0701 - 10/31 0700 In: 2650.8 [P.O.:1380; I.V.:1270.8] Out: 1050 [Urine:1050]  Intake/Output this shift:      Physical Exam:   General: WN WF who is alert.  But she is a little groggy and her speech a little slurred.   HEENT: Normal. Pupils equal. .   Lungs: Clear.   Abdomen: Soft.  No peritoneal  sxes.  BS present.   Lab Results:    Recent Labs  03/30/14 0420 03/31/14 0505  WBC 20.1* 18.6*  HGB 12.8 12.0  HCT 39.6 37.9  PLT 269 242    BMET   Recent Labs  03/30/14 0420 03/31/14 0505  NA 134* 133*  K 3.9 3.8  CL 101 100  CO2 21 22  GLUCOSE 138* 126*  BUN 5* 5*  CREATININE 0.70 0.72  CALCIUM 9.1 9.5    PT/INR  No results found for this basename: LABPROT, INR,  in the last 72 hours  ABG  No results found for this basename: PHART, PCO2, PO2, HCO3,  in the last 72 hours   Studies/Results:  Ct Abdomen Pelvis W Contrast  03/29/2014   CLINICAL DATA:  Lower abdomen pain, nausea and vomiting for 1 day, history of gastric bypass  EXAM: CT ABDOMEN AND PELVIS WITH CONTRAST  TECHNIQUE: Multidetector CT imaging of the abdomen and pelvis was performed using the standard protocol following bolus administration of intravenous contrast.  CONTRAST:  163mL OMNIPAQUE IOHEXOL 300 MG/ML  SOLN  COMPARISON:  None.  FINDINGS: A calcified granuloma is present in the anterior right lower lobe laterally. No other abnormality is seen through the lung bases. The liver enhances with no focal abnormality and no ductal dilatation is seen. Surgical clips are present from prior cholecystectomy.  The pancreas  is within normal limits in size and the pancreatic duct is not dilated. However just posterior to the pancreatic head there is a rounded inhomogeneous low-attenuation structure of 1.7 x 2.0 cm. There is indistinctness surrounding this lesion which could indicate inflammation. This could represent an exophytic lesion emanating from the junction of the pancreatic head and uncinate process. Therefore correlation with pancreatic enzymes is recommended. Another consideration is that of an inflamed duodenal diverticulum. If possible in this patient with history of gastric bypass, endoscopic ultrasound may be helpful to assess further.  The adrenal glands and spleen are unremarkable with several calcified splenic  granulomas present from prior granulomatous disease. The kidneys enhance and there is a cyst emanating from the upper pole of the right kidney medially. On delayed images the pelvocaliceal systems are unremarkable. The abdominal aorta is normal in caliber. No adenopathy is seen.  The urinary bladder is not well distended but no abnormality is seen. The uterus appears to have been resected. No adnexal lesion is seen. There are multiple diverticula scattered throughout the entire colon concentrated primarily in the descending and rectosigmoid colon. The terminal ileum is unremarkable, as is the appendix. There is no evidence of appendicitis. No fluid is seen within the pelvis. The lumbar vertebrae are in normal alignment with mild degenerative disc disease at L2-3.  IMPRESSION: 1. Inhomogeneous rounded lesion adjacent to the descending duodenum and ampulla of 1.7 x 2.0 cm. Consider an exophytic pancreatic lesion, inflamed descending duodenum diverticulum or less likely an ampullary lesion. Endoscopic ultrasound is recommended if possible in this patient with history of gastric bypass. 2. Multiple colonic diverticula. 3. Changes of prior granulomatous disease. 4. The appendix and terminal ileum appear normal.   Electronically Signed   By: Ivar Drape M.D.   On: 03/29/2014 15:48     Anti-infectives:   Anti-infectives   Start     Dose/Rate Route Frequency Ordered Stop   03/30/14 0800  ciprofloxacin (CIPRO) IVPB 400 mg     400 mg 200 mL/hr over 60 Minutes Intravenous Every 12 hours 03/30/14 0419     03/30/14 0430  metroNIDAZOLE (FLAGYL) IVPB 500 mg     500 mg 100 mL/hr over 60 Minutes Intravenous 3 times per day 03/30/14 0413     03/29/14 2015  ciprofloxacin (CIPRO) IVPB 400 mg     400 mg 200 mL/hr over 60 Minutes Intravenous  Once 03/29/14 2014 03/29/14 2135   03/29/14 2015  metroNIDAZOLE (FLAGYL) IVPB 500 mg  Status:  Discontinued     500 mg 100 mL/hr over 60 Minutes Intravenous  Once 03/29/14 2014  03/29/14 2227      Alphonsa Overall, MD, FACS Pager: [facility] Office: [phone] 03/31/2014

## 2014-04-01 DIAGNOSIS — F319 Bipolar disorder, unspecified: Secondary | ICD-10-CM

## 2014-04-01 LAB — COMPREHENSIVE METABOLIC PANEL
ALT: 67 U/L — ABNORMAL HIGH (ref 0–35)
AST: 12 U/L (ref 0–37)
Albumin: 2.6 g/dL — ABNORMAL LOW (ref 3.5–5.2)
Alkaline Phosphatase: 124 U/L — ABNORMAL HIGH (ref 39–117)
Anion gap: 10 (ref 5–15)
BUN: 4 mg/dL — ABNORMAL LOW (ref 6–23)
CO2: 24 mEq/L (ref 19–32)
Calcium: 9.1 mg/dL (ref 8.4–10.5)
Chloride: 108 mEq/L (ref 96–112)
Creatinine, Ser: 0.77 mg/dL (ref 0.50–1.10)
GFR calc Af Amer: >90 mL/min (ref >90)
GFR calc non Af Amer: >90 mL/min (ref >90)
Glucose, Bld: 103 mg/dL — ABNORMAL HIGH (ref 70–99)
Potassium: 4.2 mEq/L (ref 3.7–5.3)
Sodium: 142 mEq/L (ref 137–147)
Total Bilirubin: 0.3 mg/dL (ref 0.3–1.2)
Total Protein: 5.8 g/dL — ABNORMAL LOW (ref 6.0–8.3)

## 2014-04-01 LAB — CBC
HCT: 35.2 % — ABNORMAL LOW (ref 36.0–46.0)
Hemoglobin: 11.2 g/dL — ABNORMAL LOW (ref 12.0–15.0)
MCH: 28.1 pg (ref 26.0–34.0)
MCHC: 31.8 g/dL (ref 30.0–36.0)
MCV: 88.2 fL (ref 78.0–100.0)
Platelets: 261 10*3/uL (ref 150–400)
RBC: 3.99 MIL/uL (ref 3.87–5.11)
RDW: 14.4 % (ref 11.5–15.5)
WBC: 13.8 10*3/uL — ABNORMAL HIGH (ref 4.0–10.5)

## 2014-04-01 LAB — GLUCOSE, CAPILLARY: Glucose-Capillary: 89 mg/dL (ref 70–99)

## 2014-04-01 MED ORDER — METRONIDAZOLE 500 MG PO TABS
500.0000 mg | ORAL_TABLET | Freq: Three times a day (TID) | ORAL | Status: DC
  Administered 2014-04-01 – 2014-04-02 (×3): 500 mg via ORAL
  Filled 2014-04-01 (×6): qty 1

## 2014-04-01 MED ORDER — POLYETHYLENE GLYCOL 3350 17 G PO PACK
17.0000 g | PACK | Freq: Once | ORAL | Status: AC
  Administered 2014-04-01: 17 g via ORAL
  Filled 2014-04-01: qty 1

## 2014-04-01 MED ORDER — CIPROFLOXACIN HCL 500 MG PO TABS
500.0000 mg | ORAL_TABLET | Freq: Two times a day (BID) | ORAL | Status: DC
  Administered 2014-04-01 – 2014-04-02 (×2): 500 mg via ORAL
  Filled 2014-04-01 (×4): qty 1

## 2014-04-01 NOTE — Progress Notes (Signed)
General Surgery Note  LOS: 3 days  POD -     Assessment/Plan: 1.  Abdominal pain.  CT scan - 03/29/2014 - Inhomogeneous rounded lesion adjacent to the descending duodenum and ampulla of 1.7 x 2.0 cm. Consider an exophytic pancreatic lesion, inflamed descending duodenum diverticulum or less likely an ampullary lesion (not amendable to upper endo because of gastric bypass)   WBC 18,600 - 03/31/2104 > 13 on 11/1  On Cipro/Flagyl   Diagnosis unclear, but pt seems to be getting better.  Will continue to follow.   2. Chronic constipation with medication usage  3. S/p Gastric bypass/cholecystectomy/hysterectomy - 2005 - [facility]  She thinks that she has lost about 150 pounds since surgery.  3. Bipolar disorder  4. Depression/anxiety  5. Fibromyalgia  6. Tobacco use  7. Hypothyroid  8. GERD  9. Hx of left breast cancer with lumpectomy and radiation rx - 2009  10. Hypersomnia  11. Diverticulosis on CT.  12. Multiple medicine allergies  13.  DVT prophylaxis - SQ Heparin  Principal Problem:   Diverticula of small intestine Active Problems:   Bipolar 1 disorder   Fibromyalgia   Diverticulitis of duodenum   Subjective:   Says she feels better.  Pain is better.  Tolerating liquids. Objective:   Filed Vitals:   04/01/14 0610  BP: 105/61  Pulse: 84  Temp: 98.2 F (36.8 C)  Resp: 16     Intake/Output from previous day:  10/31 0701 - 11/01 0700 In: 3000 [P.O.:1800; I.V.:1200] Out: -   Intake/Output this shift:      Physical Exam:   General:alert.     HEENT: Normal. Pupils equal. .   Lungs: Clear.   Abdomen: Soft.  No peritoneal sxes.     Lab Results:     Recent Labs  03/31/14 0505 04/01/14 0430  WBC 18.6* 13.8*  HGB 12.0 11.2*  HCT 37.9 35.2*  PLT 242 261    BMET    Recent Labs  03/31/14 0505 04/01/14 0430  NA 133* 142  K 3.8 4.2  CL 100 108  CO2 22 24  GLUCOSE 126* 103*  BUN 5* 4*  CREATININE 0.72 0.77  CALCIUM 9.5 9.1    PT/INR  No  results for input(s): LABPROT, INR in the last 72 hours.  ABG  No results for input(s): PHART, HCO3 in the last 72 hours.  Invalid input(s): PCO2, PO2   Studies/Results:  No results found.   Anti-infectives:   Anti-infectives    Start     Dose/Rate Route Frequency Ordered Stop   03/30/14 0800  ciprofloxacin (CIPRO) IVPB 400 mg     400 mg200 mL/hr over 60 Minutes Intravenous Every 12 hours 03/30/14 0419     03/30/14 0430  metroNIDAZOLE (FLAGYL) IVPB 500 mg     500 mg100 mL/hr over 60 Minutes Intravenous 3 times per day 03/30/14 0413     03/29/14 2015  ciprofloxacin (CIPRO) IVPB 400 mg     400 mg200 mL/hr over 60 Minutes Intravenous  Once 03/29/14 2014 03/29/14 2135   03/29/14 2015  metroNIDAZOLE (FLAGYL) IVPB 500 mg  Status:  Discontinued     500 mg100 mL/hr over 60 Minutes Intravenous  Once 03/29/14 2014 62/83/15 1761     Dainelle Hun C Scot Shiraishi, MD  [facility]   04/01/2014

## 2014-04-01 NOTE — Progress Notes (Signed)
Subjective: Slight right-sided pain. Otherwise feels well.  Objective: Vital signs in last 24 hours: Temp:  [98.2 F (36.8 C)-98.8 F (37.1 C)] 98.4 F (36.9 C) (11/01 1418) Pulse Rate:  [84-90] 88 (11/01 1418) Resp:  [16] 16 (11/01 1418) BP: (105-111)/(61-70) 105/70 mmHg (11/01 1418) SpO2:  [94 %-96 %] 96 % (11/01 1418) Weight change:  Last BM Date: 03/29/14  PE: GEN:  NAD ABD:  Mild right flank/right upper quadrant tenderness, active bowel sounds.  Lab Results: CBC    Component Value Date/Time   WBC 13.8* 04/01/2014 0430   WBC 8.1 01/17/2013 0956   RBC 3.99 04/01/2014 0430   RBC 4.95 01/17/2013 0956   HGB 11.2* 04/01/2014 0430   HGB 13.5 01/17/2013 0956   HCT 35.2* 04/01/2014 0430   HCT 43.2 01/17/2013 0956   PLT 261 04/01/2014 0430   PLT 268 01/17/2013 0956   MCV 88.2 04/01/2014 0430   MCV 87.3 01/17/2013 0956   MCH 28.1 04/01/2014 0430   MCH 27.3 01/17/2013 0956   MCHC 31.8 04/01/2014 0430   MCHC 31.3* 01/17/2013 0956   RDW 14.4 04/01/2014 0430   RDW 15.5* 01/17/2013 0956   LYMPHSABS 1.4 03/29/2014 1820   LYMPHSABS 1.8 01/17/2013 0956   MONOABS 1.1* 03/29/2014 1820   MONOABS 0.4 01/17/2013 0956   EOSABS 0.2 03/29/2014 1820   EOSABS 0.3 01/17/2013 0956   BASOSABS 0.0 03/29/2014 1820   BASOSABS 0.0 01/17/2013 0956   CMP     Component Value Date/Time   NA 142 04/01/2014 0430   NA 140 01/17/2013 0956   K 4.2 04/01/2014 0430   K 4.4 01/17/2013 0956   CL 108 04/01/2014 0430   CO2 24 04/01/2014 0430   CO2 24 01/17/2013 0956   GLUCOSE 103* 04/01/2014 0430   GLUCOSE 98 01/17/2013 0956   BUN 4* 04/01/2014 0430   BUN 8.8 01/17/2013 0956   CREATININE 0.77 04/01/2014 0430   CREATININE 1.0 01/17/2013 0956   CALCIUM 9.1 04/01/2014 0430   CALCIUM 9.7 01/17/2013 0956   PROT 5.8* 04/01/2014 0430   PROT 6.8 01/17/2013 0956   ALBUMIN 2.6* 04/01/2014 0430   ALBUMIN 3.9 01/17/2013 0956   AST 12 04/01/2014 0430   AST 15 01/17/2013 0956   ALT 67* 04/01/2014  0430   ALT 15 01/17/2013 0956   ALKPHOS 124* 04/01/2014 0430   ALKPHOS 96 01/17/2013 0956   BILITOT 0.3 04/01/2014 0430   BILITOT 0.46 01/17/2013 0956   GFRNONAA >90 04/01/2014 0430   GFRAA >90 04/01/2014 0430   Assessment:  1. Abdominal pain, improved compared to time of admission. 2. Abnormal CT abdomen, ? Ampullary abnormality ? Diverticulum ?diverticulitis. 3. Leukocytosis, improving.  Plan:  1.  Continue heart healthy diet. 2.  Transition to oral antibiotics today.  Would consider 10-day total course of therapy. 3.  Transition to home narcotic regimen. 4.  Hopefully home tomorrow.   Theresa Clay 04/01/2014, 4:35 PM

## 2014-04-01 NOTE — Progress Notes (Signed)
[facility] PROGRESS NOTE  Theresa Clay [id number] DOB: Mar 21, 1956 DOA: 03/29/2014 PCP: Hulen Shouts, MD  Assessment/Plan: 1. Abd pain 1. Cont to improve with less abd pain and resolving leukocytosis 2. Cont with analgesics as tolerated 3. GI and Surgery following. Recs noted 4. Continue cipro/flagyl for now. As pt is tolerating PO, transition to PO abx on 11/1 5. Cont to advance diet as tolerated 6. GI recs for total 10 days of abx 2. Inhomogeneous intra-abdominal lesion on CT 1. GI and General Surgery per above 2. Difficult situation from a diagnostic procedure standpoint given intra-abdominal anatomical changes from previous gastric bypass 3. Cont abx and supportive care for now 3. Bipolar d/o 1. Stable 4. Fibromyalgia 1. Analgesics as needed, avoiding long-term narcotics 5. DVT prophylaxis 1. Heparin subQ while inpatient  Code Status: Full Family Communication: Pt in room Disposition Plan: [place]  Consultants:  GI  Procedures:    Antibiotics:  Ciprofloxacin 10/29>>>  Flagyl  10/29>>>  HPI/Subjective: Feels much better today  Objective: Filed Vitals:   03/31/14 0529 03/31/14 1305 03/31/14 2315 04/01/14 0610  BP: 113/65 105/60 111/67 105/61  Pulse: 98 95 90 84  Temp: 98.2 F (36.8 C) 98.1 F (36.7 C) 98.8 F (37.1 C) 98.2 F (36.8 C)  TempSrc: Oral Oral Oral Oral  Resp: 16 16 16 16   Height:      Weight:      SpO2: 93% 97% 94% 95%    Intake/Output Summary (Last 24 hours) at 04/01/14 1402 Last data filed at 04/01/14 0600  Gross per 24 hour  Intake   1880 ml  Output      0 ml  Net   1880 ml   Filed Weights   03/29/14 2225  Weight: 72 kg (158 lb 11.7 oz)    Exam:   General:  Awake, in nad  Cardiovascular: regular, s1, s2  Respiratory: normal resp effort, no wheezing  Abdomen: soft, tenderness generally over abd  Musculoskeletal: perfused, no clubbing   Data Reviewed: Basic Metabolic Panel:  Recent  Labs Lab 03/29/14 1820 03/30/14 0420 03/31/14 0505 04/01/14 0430  NA 135* 134* 133* 142  K 4.2 3.9 3.8 4.2  CL 100 101 100 108  CO2 22 21 22 24   GLUCOSE 107* 138* 126* 103*  BUN 7 5* 5* 4*  CREATININE 0.88 0.70 0.72 0.77  CALCIUM 10.1 9.1 9.5 9.1   Liver Function Tests:  Recent Labs Lab 03/29/14 1820 03/30/14 0420 03/31/14 0505 04/01/14 0430  AST 13 311* 37 12  ALT 11 257* 120* 67*  ALKPHOS 104 187* 155* 124*  BILITOT 0.3 0.5 0.4 0.3  PROT 7.5 6.1 5.9* 5.8*  ALBUMIN 4.4 3.4* 3.0* 2.6*    Recent Labs Lab 03/29/14 1820 03/31/14 0505  LIPASE 26 27   No results for input(s): AMMONIA in the last 168 hours. CBC:  Recent Labs Lab 03/29/14 1820 03/30/14 0420 03/31/14 0505 04/01/14 0430  WBC 20.2* 20.1* 18.6* 13.8*  NEUTROABS 17.5*  --   --   --   HGB 14.5 12.8 12.0 11.2*  HCT 44.3 39.6 37.9 35.2*  MCV 87.2 87.0 88.1 88.2  PLT 306 269 242 261   Cardiac Enzymes: No results for input(s): CKTOTAL, CKMB, CKMBINDEX, TROPONINI in the last 168 hours. BNP (last 3 results) No results for input(s): PROBNP in the last 8760 hours. CBG:  Recent Labs Lab 03/30/14 0801 03/31/14 0739 04/01/14 0748  GLUCAP 109* 110* 89    No results found for this or any previous  visit (from the past 240 hour(s)).   Studies: No results found.  Scheduled Meds: . amphetamine-dextroamphetamine  20 mg Oral BID WC  . ciprofloxacin  500 mg Oral BID  . cyclobenzaprine  10 mg Oral QHS  . folic acid  1 mg Oral Daily  . heparin  5,000 Units Subcutaneous 3 times per day  . lamoTRIgine  200 mg Oral QHS  . levothyroxine  100 mcg Oral QHS  . lithium carbonate  300 mg Oral Daily  . lithium carbonate  600 mg Oral QHS  . LORazepam  0.5 mg Oral Daily  . LORazepam  1 mg Oral QHS  . lurasidone  40 mg Oral Daily  . metroNIDAZOLE  500 mg Oral 3 times per day  . multivitamin with minerals  1 tablet Oral Daily  . pantoprazole  80 mg Oral Daily  . thiamine  100 mg Oral Daily  . traZODone  300 mg  Oral QHS   Continuous Infusions: . sodium chloride 50 mL/hr at 04/01/14 0316    Principal Problem:   Diverticula of small intestine Active Problems:   Bipolar 1 disorder   Fibromyalgia   Diverticulitis of duodenum  Time spent: 40min   Yorel Redder, [facility] Pager [phone]. If 7PM-7AM, please contact night-coverage at [email or web address], password [facility] 04/01/2014, 2:02 PM  LOS: 3 days

## 2014-04-02 ENCOUNTER — Inpatient Hospital Stay (HOSPITAL_COMMUNITY): Payer: 59

## 2014-04-02 ENCOUNTER — Encounter (HOSPITAL_COMMUNITY): Payer: Self-pay | Admitting: Emergency Medicine

## 2014-04-02 LAB — COMPREHENSIVE METABOLIC PANEL
ALT: 43 U/L — ABNORMAL HIGH (ref 0–35)
AST: 8 U/L (ref 0–37)
Albumin: 2.6 g/dL — ABNORMAL LOW (ref 3.5–5.2)
Alkaline Phosphatase: 110 U/L (ref 39–117)
Anion gap: 10 (ref 5–15)
BUN: 5 mg/dL — ABNORMAL LOW (ref 6–23)
CO2: 24 mEq/L (ref 19–32)
Calcium: 9.2 mg/dL (ref 8.4–10.5)
Chloride: 102 mEq/L (ref 96–112)
Creatinine, Ser: 0.74 mg/dL (ref 0.50–1.10)
GFR calc Af Amer: >90 mL/min (ref >90)
GFR calc non Af Amer: >90 mL/min (ref >90)
Glucose, Bld: 109 mg/dL — ABNORMAL HIGH (ref 70–99)
Potassium: 3.9 mEq/L (ref 3.7–5.3)
Sodium: 136 mEq/L — ABNORMAL LOW (ref 137–147)
Total Bilirubin: 0.2 mg/dL — ABNORMAL LOW (ref 0.3–1.2)
Total Protein: 5.6 g/dL — ABNORMAL LOW (ref 6.0–8.3)

## 2014-04-02 LAB — GLUCOSE, CAPILLARY: Glucose-Capillary: 101 mg/dL — ABNORMAL HIGH (ref 70–99)

## 2014-04-02 LAB — CBC
HCT: 34.8 % — ABNORMAL LOW (ref 36.0–46.0)
Hemoglobin: 11.1 g/dL — ABNORMAL LOW (ref 12.0–15.0)
MCH: 28 pg (ref 26.0–34.0)
MCHC: 31.9 g/dL (ref 30.0–36.0)
MCV: 87.9 fL (ref 78.0–100.0)
Platelets: 240 10*3/uL (ref 150–400)
RBC: 3.96 MIL/uL (ref 3.87–5.11)
RDW: 14.3 % (ref 11.5–15.5)
WBC: 10.9 10*3/uL — ABNORMAL HIGH (ref 4.0–10.5)

## 2014-04-02 MED ORDER — METRONIDAZOLE 500 MG PO TABS: 500.0000 mg | ORAL_TABLET | Freq: Three times a day (TID) | ORAL | Status: DC

## 2014-04-02 MED ORDER — POLYETHYLENE GLYCOL 3350 17 G PO PACK: 17.0000 g | PACK | Freq: Every day | ORAL | Status: DC

## 2014-04-02 MED ORDER — CYCLOBENZAPRINE HCL 10 MG PO TABS: 10.0000 mg | ORAL_TABLET | Freq: Every day | ORAL | Status: DC

## 2014-04-02 MED ORDER — CIPROFLOXACIN HCL 500 MG PO TABS: 500.0000 mg | ORAL_TABLET | Freq: Two times a day (BID) | ORAL | Status: DC

## 2014-04-02 NOTE — Progress Notes (Signed)
  Subjective: States no flatus or bm, has some abd pain, tol diet fine though, no real n/v  Objective: Vital signs in last 24 hours: Temp:  [98.3 F (36.8 C)-98.8 F (37.1 C)] 98.3 F (36.8 C) (11/02 0512) Pulse Rate:  [80-97] 80 (11/02 0512) Resp:  [16] 16 (11/02 0512) BP: (97-115)/(54-70) 97/54 mmHg (11/02 0512) SpO2:  [94 %-96 %] 94 % (11/02 0512) Last BM Date: 03/29/14  Intake/Output from previous day: 11/01 0701 - 11/02 0700 In: 1607.5 [P.O.:1080; I.V.:527.5] Out: -  Intake/Output this shift:    GI: mild tender bilateral LQ, bs present  Lab Results:   Recent Labs  04/01/14 0430 04/02/14 0700  WBC 13.8* 10.9*  HGB 11.2* 11.1*  HCT 35.2* 34.8*  PLT 261 240   BMET  Recent Labs  04/01/14 0430 04/02/14 0700  NA 142 136*  K 4.2 3.9  CL 108 102  CO2 24 24  GLUCOSE 103* 109*  BUN 4* 5*  CREATININE 0.77 0.74  CALCIUM 9.1 9.2   PT/INR No results for input(s): LABPROT, INR in the last 72 hours. ABG No results for input(s): PHART, HCO3 in the last 72 hours.  Invalid input(s): PCO2, PO2  Studies/Results: No results found.  Anti-infectives: Anti-infectives    Start     Dose/Rate Route Frequency Ordered Stop   04/01/14 2000  ciprofloxacin (CIPRO) tablet 500 mg     500 mg Oral 2 times daily 04/01/14 1402     04/01/14 1500  metroNIDAZOLE (FLAGYL) tablet 500 mg     500 mg Oral 3 times per day 04/01/14 1402     03/30/14 0800  ciprofloxacin (CIPRO) IVPB 400 mg  Status:  Discontinued     400 mg200 mL/hr over 60 Minutes Intravenous Every 12 hours 03/30/14 0419 04/01/14 1402   03/30/14 0430  metroNIDAZOLE (FLAGYL) IVPB 500 mg  Status:  Discontinued     500 mg100 mL/hr over 60 Minutes Intravenous 3 times per day 03/30/14 0413 04/01/14 1402   03/29/14 2015  ciprofloxacin (CIPRO) IVPB 400 mg     400 mg200 mL/hr over 60 Minutes Intravenous  Once 03/29/14 2014 03/29/14 2135   03/29/14 2015  metroNIDAZOLE (FLAGYL) IVPB 500 mg  Status:  Discontinued     500 mg100  mL/hr over 60 Minutes Intravenous  Once 03/29/14 2014 03/29/14 2227      Assessment/Plan: abd pain unknown etiology History gastric bypass  I don't think her current pain is from the abnormality on her ct scan. She has been here long enough that there likely isnt an emergency related to her prior bypass.  Agree with Dr Lavetta Nielsen plan.  I will add some plain films today as she says she is not having any flatus at all.  [facility] 04/02/2014

## 2014-04-02 NOTE — Plan of Care (Signed)
Problem: Consults Goal: General Medical Patient Education See Patient Education Module for specific education.  Outcome: Completed/Met Date Met:  04/02/14 Goal: Nutrition Consult-if indicated Outcome: Not Applicable Date Met:  73/41/93

## 2014-04-02 NOTE — Plan of Care (Signed)
Problem: Phase I Progression Outcomes Goal: Hemodynamically stable Outcome: Completed/Met Date Met:  04/02/14 Goal: Other Phase I Outcomes/Goals Outcome: Not Applicable Date Met:  94/70/76  Problem: Phase II Progression Outcomes Goal: Progress activity as tolerated unless otherwise ordered Outcome: Completed/Met Date Met:  04/02/14 Goal: Discharge plan established Outcome: Completed/Met Date Met:  04/02/14 Goal: Vital signs remain stable Outcome: Completed/Met Date Met:  04/02/14 Goal: IV changed to normal saline lock Outcome: Adequate for Discharge Goal: Obtain order to discontinue catheter if appropriate Outcome: Not Applicable Date Met:  15/18/34 Goal: Other Phase II Outcomes/Goals Outcome: Not Applicable Date Met:  37/35/78  Problem: Phase III Progression Outcomes Goal: Pain controlled on oral analgesia Outcome: Not Applicable Date Met:  97/84/78 Goal: Activity at appropriate level-compared to baseline (UP IN CHAIR FOR HEMODIALYSIS)  Outcome: Completed/Met Date Met:  04/02/14 Goal: Voiding independently Outcome: Completed/Met Date Met:  04/02/14 Goal: IV/normal saline lock discontinued Outcome: Completed/Met Date Met:  04/02/14 Goal: Foley discontinued Outcome: Not Applicable Date Met:  41/28/20 Goal: Discharge plan remains appropriate-arrangements made Outcome: Completed/Met Date Met:  04/02/14 Goal: Other Phase III Outcomes/Goals Outcome: Not Applicable Date Met:  81/38/87  Problem: Discharge Progression Outcomes Goal: Discharge plan in place and appropriate Outcome: Completed/Met Date Met:  04/02/14 Goal: Pain controlled with appropriate interventions Outcome: Completed/Met Date Met:  04/02/14 Goal: Hemodynamically stable Outcome: Completed/Met Date Met:  19/59/74 Goal: Complications resolved/controlled Outcome: Completed/Met Date Met:  04/02/14 Goal: Tolerating diet Outcome: Completed/Met Date Met:  04/02/14 Goal: Activity appropriate for discharge  plan Outcome: Completed/Met Date Met:  04/02/14 Goal: Other Discharge Outcomes/Goals Outcome: Not Applicable Date Met:  71/85/50

## 2014-04-02 NOTE — Discharge Summary (Signed)
Physician Discharge Summary  Theresa Clay [id number] DOB: 01/25/1956 DOA: 03/29/2014  PCP: Hulen Shouts, MD  Admit date: 03/29/2014 Discharge date: 04/02/2014  Time spent: 35 minutes  Recommendations for Outpatient Follow-up:  1. Follow up with PCP in 1-2 weeks  Discharge Diagnoses:  Principal Problem:   Diverticula of small intestine Active Problems:   Bipolar 1 disorder   Fibromyalgia   Diverticulitis of duodenum   Discharge Condition: Improved  Diet recommendation: Regular  Filed Weights   03/29/14 2225  Weight: 72 kg (158 lb 11.7 oz)   History of present illness:  Please see admit h and p from 10/29 for details. Briefly, pt presents with abd pain with an inhomogeneous intra-abdominal lesion seen on abd CT. Pt was admitted for further work up.  Hospital Course:   1. Abd pain 1. Cont to improve with less abd pain and resolving leukocytosis 2. Cont with analgesics as tolerated 3. GI and Surgery following. Recs noted 4. Continue cipro/flagyl for now. As pt is tolerating PO, transition to PO abx on 11/1 5. Cont to advance diet as tolerated 6. GI recs for total 10 days of abx 7. Follow up abd xray with stool noted along the R quadrants, corresponding to pain on day of xray - suspect constipation related pain and would recommend cathartics as needed. Pos bowel sounds on exam 2. Inhomogeneous intra-abdominal lesion on CT 1. GI and General Surgery per above 2. Difficult situation from a diagnostic procedure standpoint given intra-abdominal anatomical changes from previous gastric bypass 3. Bipolar d/o 1. Stable 4. Fibromyalgia 1. Analgesics as needed, avoiding long-term narcotics 5. DVT prophylaxis 1. Heparin subQ while inpatient  Consultations:  General Surgery  GI  Discharge Exam: Filed Vitals:   04/01/14 0610 04/01/14 1418 04/01/14 2032 04/02/14 0512  BP: 105/61 105/70 115/64 97/54  Pulse: 84 88 97 80  Temp: 98.2 F (36.8 C) 98.4 F (36.9  C) 98.8 F (37.1 C) 98.3 F (36.8 C)  TempSrc: Oral Oral Oral Oral  Resp: 16 16 16 16   Height:      Weight:      SpO2: 95% 96% 96% 94%    General: Awake, in nad Cardiovascular: regular, s1, s2 Respiratory: normal resp effort, no wheezing  Discharge Instructions     Medication List    TAKE these medications        albuterol 108 (90 BASE) MCG/ACT inhaler  Commonly known as:  PROVENTIL HFA;VENTOLIN HFA  Inhale 2 puffs into the lungs every 4 (four) hours as needed for wheezing. 2 puffs as needed     amphetamine-dextroamphetamine 20 MG 24 hr capsule  Commonly known as:  ADDERALL XR  Take 20 mg by mouth 2 (two) times daily.     CELEBREX 200 MG capsule  Generic drug:  celecoxib  200 mg daily.     ciprofloxacin 500 MG tablet  Commonly known as:  CIPRO  Take 1 tablet (500 mg total) by mouth 2 (two) times daily.     cyclobenzaprine 10 MG tablet  Commonly known as:  FLEXERIL  Take 1 tablet (10 mg total) by mouth at bedtime.     lamoTRIgine 200 MG tablet  Commonly known as:  LAMICTAL  Take 200 mg by mouth at bedtime.     LATUDA 80 MG Tabs tablet  Generic drug:  lurasidone  Take by mouth daily. One tablet  With food once daily     levothyroxine 100 MCG tablet  Commonly known as:  SYNTHROID, LEVOTHROID  Take 100  mcg by mouth at bedtime.     LIDODERM 5 %  Generic drug:  lidocaine  Place 1-3 patches onto the skin every 12 (twelve) hours as needed (pain).     lithium carbonate 300 MG capsule  Take 300-600 mg by mouth 2 (two) times daily. One tablet in am two tablets pm     LORazepam 1 MG tablet  Commonly known as:  ATIVAN  Take 0.5-1 mg by mouth 2 (two) times daily. Take 1mg  at night and 0.5mg  in the morning     metroNIDAZOLE 500 MG tablet  Commonly known as:  FLAGYL  Take 1 tablet (500 mg total) by mouth every 8 (eight) hours.     multivitamin tablet  Take 1 tablet by mouth daily.     omeprazole 40 MG capsule  Commonly known as:  PRILOSEC  40 mg daily.      polyethylene glycol packet  Commonly known as:  MIRALAX / GLYCOLAX  Take 17 g by mouth daily.     tiZANidine 4 MG capsule  Commonly known as:  ZANAFLEX  Take 4 mg by mouth daily. Take daily     traZODone 150 MG tablet  Commonly known as:  DESYREL  Take 300 mg by mouth at bedtime.     UNABLE TO FIND  B12 injections one or twice weekly     VALTREX 500 MG tablet  Generic drug:  valACYclovir  Take 500 mg by mouth daily. One tablet every 12 hours as needed       Allergies  Allergen Reactions  . Darvocet [Propoxyphene N-Acetaminophen] Anaphylaxis  . Penicillins Anaphylaxis  . Codeine Other (See Comments)    "double over in pain" per pt  . Hydrocodone Nausea And Vomiting    pain  . Oxycodone Nausea And Vomiting    pain  . Simvastatin     myalgias   Follow-up Information    Follow up with Hulen Shouts, MD. Schedule an appointment as soon as possible for a visit in 1 week.   Specialty:  Family Medicine   Contact information:   [place] [address] [phone]        The results of significant diagnostics from this hospitalization (including imaging, microbiology, ancillary and laboratory) are listed below for reference.    Significant Diagnostic Studies: Ct Abdomen Pelvis W Contrast  03/29/2014   CLINICAL DATA:  Lower abdomen pain, nausea and vomiting for 1 day, history of gastric bypass  EXAM: CT ABDOMEN AND PELVIS WITH CONTRAST  TECHNIQUE: Multidetector CT imaging of the abdomen and pelvis was performed using the standard protocol following bolus administration of intravenous contrast.  CONTRAST:  125mL OMNIPAQUE IOHEXOL 300 MG/ML  SOLN  COMPARISON:  None.  FINDINGS: A calcified granuloma is present in the anterior right lower lobe laterally. No other abnormality is seen through the lung bases. The liver enhances with no focal abnormality and no ductal dilatation is seen. Surgical clips are present from prior cholecystectomy.  The pancreas  is within normal limits in size and the pancreatic duct is not dilated. However just posterior to the pancreatic head there is a rounded inhomogeneous low-attenuation structure of 1.7 x 2.0 cm. There is indistinctness surrounding this lesion which could indicate inflammation. This could represent an exophytic lesion emanating from the junction of the pancreatic head and uncinate process. Therefore correlation with pancreatic enzymes is recommended. Another consideration is that of an inflamed duodenal diverticulum. If possible in this patient with history of gastric  bypass, endoscopic ultrasound may be helpful to assess further.  The adrenal glands and spleen are unremarkable with several calcified splenic granulomas present from prior granulomatous disease. The kidneys enhance and there is a cyst emanating from the upper pole of the right kidney medially. On delayed images the pelvocaliceal systems are unremarkable. The abdominal aorta is normal in caliber. No adenopathy is seen.  The urinary bladder is not well distended but no abnormality is seen. The uterus appears to have been resected. No adnexal lesion is seen. There are multiple diverticula scattered throughout the entire colon concentrated primarily in the descending and rectosigmoid colon. The terminal ileum is unremarkable, as is the appendix. There is no evidence of appendicitis. No fluid is seen within the pelvis. The lumbar vertebrae are in normal alignment with mild degenerative disc disease at L2-3.  IMPRESSION: 1. Inhomogeneous rounded lesion adjacent to the descending duodenum and ampulla of 1.7 x 2.0 cm. Consider an exophytic pancreatic lesion, inflamed descending duodenum diverticulum or less likely an ampullary lesion. Endoscopic ultrasound is recommended if possible in this patient with history of gastric bypass. 2. Multiple colonic diverticula. 3. Changes of prior granulomatous disease. 4. The appendix and terminal ileum appear normal.    Electronically Signed   By: Ivar Drape M.D.   On: 03/29/2014 15:48   Dg Abd 2 Views  04/02/2014   CLINICAL DATA:  Right lower quadrant abdominal pain.  Ileus.  EXAM: ABDOMEN - 2 VIEW  COMPARISON:  CT scan of March 29, 2014.  FINDINGS: Large amount of residual contrast and stool is noted in the right colon. Status post cholecystectomy. Phleboliths are noted in the pelvis. Air is noted in the rectum. Mildly dilated loops of colon are noted suggesting ileus. No significant small bowel dilatation is noted.  IMPRESSION: Large amount of residual contrast and stool seen in right colon. Air-filled loops of colon are noted suggesting mild colonic ileus. No small bowel dilatation or obstruction is noted.   Electronically Signed   By: Sabino Dick M.D.   On: 04/02/2014 10:19    Microbiology: No results found for this or any previous visit (from the past 240 hour(s)).   Labs: Basic Metabolic Panel:  Recent Labs Lab 03/29/14 1820 03/30/14 0420 03/31/14 0505 04/01/14 0430 04/02/14 0700  NA 135* 134* 133* 142 136*  K 4.2 3.9 3.8 4.2 3.9  CL 100 101 100 108 102  CO2 22 21 22 24 24   GLUCOSE 107* 138* 126* 103* 109*  BUN 7 5* 5* 4* 5*  CREATININE 0.88 0.70 0.72 0.77 0.74  CALCIUM 10.1 9.1 9.5 9.1 9.2   Liver Function Tests:  Recent Labs Lab 03/29/14 1820 03/30/14 0420 03/31/14 0505 04/01/14 0430 04/02/14 0700  AST 13 311* 37 12 8  ALT 11 257* 120* 67* 43*  ALKPHOS 104 187* 155* 124* 110  BILITOT 0.3 0.5 0.4 0.3 0.2*  PROT 7.5 6.1 5.9* 5.8* 5.6*  ALBUMIN 4.4 3.4* 3.0* 2.6* 2.6*    Recent Labs Lab 03/29/14 1820 03/31/14 0505  LIPASE 26 27   No results for input(s): AMMONIA in the last 168 hours. CBC:  Recent Labs Lab 03/29/14 1820 03/30/14 0420 03/31/14 0505 04/01/14 0430 04/02/14 0700  WBC 20.2* 20.1* 18.6* 13.8* 10.9*  NEUTROABS 17.5*  --   --   --   --   HGB 14.5 12.8 12.0 11.2* 11.1*  HCT 44.3 39.6 37.9 35.2* 34.8*  MCV 87.2 87.0 88.1 88.2 87.9  PLT 306 269 242  261 240  Cardiac Enzymes: No results for input(s): CKTOTAL, CKMB, CKMBINDEX, TROPONINI in the last 168 hours. BNP: BNP (last 3 results) No results for input(s): PROBNP in the last 8760 hours. CBG:  Recent Labs Lab 03/30/14 0801 03/31/14 0739 04/01/14 0748 04/02/14 0756  GLUCAP 109* 110* 89 101*   Signed:  Krissia Schreier K  [facility] 04/02/2014, 10:31 AM

## 2014-04-23 DIAGNOSIS — F330 Major depressive disorder, recurrent, mild: Secondary | ICD-10-CM | POA: Diagnosis not present

## 2014-06-20 DIAGNOSIS — F330 Major depressive disorder, recurrent, mild: Secondary | ICD-10-CM | POA: Diagnosis not present

## 2014-07-05 DIAGNOSIS — F330 Major depressive disorder, recurrent, mild: Secondary | ICD-10-CM | POA: Diagnosis not present

## 2014-07-18 ENCOUNTER — Other Ambulatory Visit: Payer: Self-pay | Admitting: Radiation Oncology

## 2014-07-18 ENCOUNTER — Ambulatory Visit
Admission: RE | Admit: 2014-07-18 | Discharge: 2014-07-18 | Disposition: A | Discharge status: Home / Self Care | Payer: 59 | Source: Ambulatory Visit

## 2014-07-18 ENCOUNTER — Ambulatory Visit
Admission: RE | Admit: 2014-07-18 | Discharge: 2014-07-18 | Disposition: A | Discharge status: Home / Self Care | Payer: Medicare Other | Source: Ambulatory Visit | Attending: Radiation Oncology | Admitting: Radiation Oncology

## 2014-07-18 ENCOUNTER — Encounter (INDEPENDENT_AMBULATORY_CARE_PROVIDER_SITE_OTHER): Payer: Self-pay

## 2014-07-18 DIAGNOSIS — Z853 Personal history of malignant neoplasm of breast: Secondary | ICD-10-CM

## 2014-07-23 DIAGNOSIS — L821 Other seborrheic keratosis: Secondary | ICD-10-CM | POA: Diagnosis not present

## 2014-07-23 DIAGNOSIS — L708 Other acne: Secondary | ICD-10-CM | POA: Diagnosis not present

## 2014-07-23 DIAGNOSIS — L814 Other melanin hyperpigmentation: Secondary | ICD-10-CM | POA: Diagnosis not present

## 2014-07-23 DIAGNOSIS — D225 Melanocytic nevi of trunk: Secondary | ICD-10-CM | POA: Diagnosis not present

## 2014-07-23 DIAGNOSIS — B078 Other viral warts: Secondary | ICD-10-CM | POA: Diagnosis not present

## 2014-07-31 DIAGNOSIS — F330 Major depressive disorder, recurrent, mild: Secondary | ICD-10-CM | POA: Diagnosis not present

## 2014-09-10 DIAGNOSIS — E039 Hypothyroidism, unspecified: Secondary | ICD-10-CM | POA: Diagnosis not present

## 2014-09-10 DIAGNOSIS — F330 Major depressive disorder, recurrent, mild: Secondary | ICD-10-CM | POA: Diagnosis not present

## 2014-09-10 DIAGNOSIS — K5900 Constipation, unspecified: Secondary | ICD-10-CM | POA: Diagnosis not present

## 2014-09-17 DIAGNOSIS — F330 Major depressive disorder, recurrent, mild: Secondary | ICD-10-CM | POA: Diagnosis not present

## 2014-09-26 DIAGNOSIS — F330 Major depressive disorder, recurrent, mild: Secondary | ICD-10-CM | POA: Diagnosis not present

## 2014-10-26 DIAGNOSIS — F330 Major depressive disorder, recurrent, mild: Secondary | ICD-10-CM | POA: Diagnosis not present

## 2014-11-21 DIAGNOSIS — F331 Major depressive disorder, recurrent, moderate: Secondary | ICD-10-CM | POA: Diagnosis not present

## 2014-12-13 DIAGNOSIS — F331 Major depressive disorder, recurrent, moderate: Secondary | ICD-10-CM | POA: Diagnosis not present

## 2014-12-18 ENCOUNTER — Other Ambulatory Visit: Payer: Self-pay | Admitting: Family Medicine

## 2014-12-18 DIAGNOSIS — R2232 Localized swelling, mass and lump, left upper limb: Secondary | ICD-10-CM

## 2014-12-26 DIAGNOSIS — F331 Major depressive disorder, recurrent, moderate: Secondary | ICD-10-CM | POA: Diagnosis not present

## 2015-01-07 DIAGNOSIS — F331 Major depressive disorder, recurrent, moderate: Secondary | ICD-10-CM | POA: Diagnosis not present

## 2015-01-16 DIAGNOSIS — R238 Other skin changes: Secondary | ICD-10-CM | POA: Diagnosis not present

## 2015-01-16 DIAGNOSIS — M797 Fibromyalgia: Secondary | ICD-10-CM | POA: Diagnosis not present

## 2015-01-16 DIAGNOSIS — E782 Mixed hyperlipidemia: Secondary | ICD-10-CM | POA: Diagnosis not present

## 2015-01-16 DIAGNOSIS — E119 Type 2 diabetes mellitus without complications: Secondary | ICD-10-CM | POA: Diagnosis not present

## 2015-01-16 DIAGNOSIS — E039 Hypothyroidism, unspecified: Secondary | ICD-10-CM | POA: Diagnosis not present

## 2015-01-21 ENCOUNTER — Other Ambulatory Visit: Payer: Medicare Other

## 2015-01-22 ENCOUNTER — Ambulatory Visit
Admission: RE | Admit: 2015-01-22 | Discharge: 2015-01-22 | Disposition: A | Discharge status: Home / Self Care | Payer: Medicare Other | Source: Ambulatory Visit | Attending: Family Medicine | Admitting: Family Medicine

## 2015-01-22 DIAGNOSIS — R2232 Localized swelling, mass and lump, left upper limb: Secondary | ICD-10-CM

## 2015-02-25 DIAGNOSIS — F331 Major depressive disorder, recurrent, moderate: Secondary | ICD-10-CM | POA: Diagnosis not present

## 2015-02-28 DIAGNOSIS — F331 Major depressive disorder, recurrent, moderate: Secondary | ICD-10-CM | POA: Diagnosis not present

## 2015-03-26 DIAGNOSIS — F331 Major depressive disorder, recurrent, moderate: Secondary | ICD-10-CM | POA: Diagnosis not present

## 2015-04-17 DIAGNOSIS — F331 Major depressive disorder, recurrent, moderate: Secondary | ICD-10-CM | POA: Diagnosis not present

## 2015-04-30 DIAGNOSIS — F331 Major depressive disorder, recurrent, moderate: Secondary | ICD-10-CM | POA: Diagnosis not present

## 2015-05-15 DIAGNOSIS — H2513 Age-related nuclear cataract, bilateral: Secondary | ICD-10-CM | POA: Diagnosis not present

## 2015-05-15 DIAGNOSIS — H2511 Age-related nuclear cataract, right eye: Secondary | ICD-10-CM | POA: Diagnosis not present

## 2015-05-21 DIAGNOSIS — H2511 Age-related nuclear cataract, right eye: Secondary | ICD-10-CM | POA: Diagnosis not present

## 2015-05-28 DIAGNOSIS — H2512 Age-related nuclear cataract, left eye: Secondary | ICD-10-CM | POA: Diagnosis not present

## 2015-06-04 DIAGNOSIS — F331 Major depressive disorder, recurrent, moderate: Secondary | ICD-10-CM | POA: Diagnosis not present

## 2015-06-14 ENCOUNTER — Other Ambulatory Visit: Payer: Self-pay

## 2015-06-14 DIAGNOSIS — Z1231 Encounter for screening mammogram for malignant neoplasm of breast: Secondary | ICD-10-CM

## 2015-06-25 DIAGNOSIS — Z79899 Other long term (current) drug therapy: Secondary | ICD-10-CM | POA: Diagnosis not present

## 2015-06-27 DIAGNOSIS — F331 Major depressive disorder, recurrent, moderate: Secondary | ICD-10-CM | POA: Diagnosis not present

## 2015-07-23 ENCOUNTER — Other Ambulatory Visit: Payer: Self-pay

## 2015-07-23 ENCOUNTER — Ambulatory Visit
Admission: RE | Admit: 2015-07-23 | Discharge: 2015-07-23 | Disposition: A | Discharge status: Home / Self Care | Payer: Medicare Other | Source: Ambulatory Visit

## 2015-07-23 DIAGNOSIS — Z853 Personal history of malignant neoplasm of breast: Secondary | ICD-10-CM

## 2015-07-23 DIAGNOSIS — R928 Other abnormal and inconclusive findings on diagnostic imaging of breast: Secondary | ICD-10-CM | POA: Diagnosis not present

## 2015-07-23 DIAGNOSIS — N6489 Other specified disorders of breast: Secondary | ICD-10-CM | POA: Diagnosis not present

## 2015-07-23 DIAGNOSIS — Z1231 Encounter for screening mammogram for malignant neoplasm of breast: Secondary | ICD-10-CM

## 2015-07-29 DIAGNOSIS — L821 Other seborrheic keratosis: Secondary | ICD-10-CM | POA: Diagnosis not present

## 2015-07-29 DIAGNOSIS — L812 Freckles: Secondary | ICD-10-CM | POA: Diagnosis not present

## 2015-07-29 DIAGNOSIS — D1801 Hemangioma of skin and subcutaneous tissue: Secondary | ICD-10-CM | POA: Diagnosis not present

## 2015-08-01 DIAGNOSIS — F331 Major depressive disorder, recurrent, moderate: Secondary | ICD-10-CM | POA: Diagnosis not present

## 2015-08-07 DIAGNOSIS — F331 Major depressive disorder, recurrent, moderate: Secondary | ICD-10-CM | POA: Diagnosis not present

## 2015-09-17 DIAGNOSIS — F331 Major depressive disorder, recurrent, moderate: Secondary | ICD-10-CM | POA: Diagnosis not present

## 2015-10-25 ENCOUNTER — Other Ambulatory Visit (HOSPITAL_COMMUNITY): Payer: Self-pay | Admitting: Psychiatry

## 2015-11-01 DIAGNOSIS — F3341 Major depressive disorder, recurrent, in partial remission: Secondary | ICD-10-CM | POA: Diagnosis not present

## 2016-01-18 ENCOUNTER — Other Ambulatory Visit (HOSPITAL_COMMUNITY): Payer: Self-pay | Admitting: Psychiatry

## 2016-02-10 DIAGNOSIS — F331 Major depressive disorder, recurrent, moderate: Secondary | ICD-10-CM | POA: Diagnosis not present

## 2016-02-18 DIAGNOSIS — F331 Major depressive disorder, recurrent, moderate: Secondary | ICD-10-CM | POA: Diagnosis not present

## 2016-03-07 DIAGNOSIS — J069 Acute upper respiratory infection, unspecified: Secondary | ICD-10-CM | POA: Diagnosis not present

## 2016-04-08 DIAGNOSIS — F3341 Major depressive disorder, recurrent, in partial remission: Secondary | ICD-10-CM | POA: Diagnosis not present

## 2016-05-15 ENCOUNTER — Other Ambulatory Visit (HOSPITAL_COMMUNITY): Payer: Self-pay | Admitting: Psychiatry

## 2016-06-02 DIAGNOSIS — J209 Acute bronchitis, unspecified: Secondary | ICD-10-CM | POA: Diagnosis not present

## 2016-06-02 DIAGNOSIS — J069 Acute upper respiratory infection, unspecified: Secondary | ICD-10-CM | POA: Diagnosis not present

## 2016-06-15 DIAGNOSIS — F3341 Major depressive disorder, recurrent, in partial remission: Secondary | ICD-10-CM | POA: Diagnosis not present

## 2016-06-30 DIAGNOSIS — F331 Major depressive disorder, recurrent, moderate: Secondary | ICD-10-CM | POA: Diagnosis not present

## 2016-07-30 ENCOUNTER — Other Ambulatory Visit: Payer: Self-pay | Admitting: Family Medicine

## 2016-07-30 DIAGNOSIS — R2232 Localized swelling, mass and lump, left upper limb: Secondary | ICD-10-CM

## 2016-08-13 DIAGNOSIS — F3341 Major depressive disorder, recurrent, in partial remission: Secondary | ICD-10-CM | POA: Diagnosis not present

## 2016-08-17 ENCOUNTER — Other Ambulatory Visit: Payer: 59

## 2016-08-21 ENCOUNTER — Ambulatory Visit
Admission: RE | Admit: 2016-08-21 | Discharge: 2016-08-21 | Disposition: A | Discharge status: Home / Self Care | Payer: Medicare Other | Source: Ambulatory Visit | Attending: Family Medicine | Admitting: Family Medicine

## 2016-08-21 DIAGNOSIS — R2232 Localized swelling, mass and lump, left upper limb: Secondary | ICD-10-CM

## 2016-08-21 DIAGNOSIS — R928 Other abnormal and inconclusive findings on diagnostic imaging of breast: Secondary | ICD-10-CM | POA: Diagnosis not present

## 2016-09-21 DIAGNOSIS — E039 Hypothyroidism, unspecified: Secondary | ICD-10-CM | POA: Diagnosis not present

## 2016-09-21 DIAGNOSIS — E782 Mixed hyperlipidemia: Secondary | ICD-10-CM | POA: Diagnosis not present

## 2016-09-21 DIAGNOSIS — Z0000 Encounter for general adult medical examination without abnormal findings: Secondary | ICD-10-CM | POA: Diagnosis not present

## 2016-09-29 ENCOUNTER — Encounter: Payer: Self-pay | Admitting: Hematology

## 2016-10-08 ENCOUNTER — Encounter: Payer: Self-pay | Admitting: Hematology

## 2016-10-08 ENCOUNTER — Ambulatory Visit (HOSPITAL_BASED_OUTPATIENT_CLINIC_OR_DEPARTMENT_OTHER): Payer: 59

## 2016-10-08 ENCOUNTER — Telehealth: Payer: Self-pay | Admitting: Hematology

## 2016-10-08 ENCOUNTER — Ambulatory Visit (HOSPITAL_BASED_OUTPATIENT_CLINIC_OR_DEPARTMENT_OTHER): Payer: 59 | Admitting: Hematology

## 2016-10-08 VITALS — BP 156/89 | HR 74 | Temp 98.7°F | Resp 18 | Ht 64.0 in | Wt 175.2 lb

## 2016-10-08 DIAGNOSIS — E538 Deficiency of other specified B group vitamins: Secondary | ICD-10-CM

## 2016-10-08 DIAGNOSIS — E539 Vitamin B deficiency, unspecified: Secondary | ICD-10-CM | POA: Diagnosis not present

## 2016-10-08 DIAGNOSIS — D508 Other iron deficiency anemias: Secondary | ICD-10-CM

## 2016-10-08 DIAGNOSIS — Z9884 Bariatric surgery status: Secondary | ICD-10-CM

## 2016-10-08 DIAGNOSIS — D509 Iron deficiency anemia, unspecified: Secondary | ICD-10-CM | POA: Diagnosis not present

## 2016-10-08 DIAGNOSIS — E559 Vitamin D deficiency, unspecified: Secondary | ICD-10-CM

## 2016-10-08 LAB — COMPREHENSIVE METABOLIC PANEL
ALT: 12 U/L (ref 0–55)
AST: 13 U/L (ref 5–34)
Albumin: 4.1 g/dL (ref 3.5–5.0)
Alkaline Phosphatase: 137 U/L (ref 40–150)
Anion Gap: 6 mEq/L (ref 3–11)
BUN: 7.8 mg/dL (ref 7.0–26.0)
CO2: 22 mEq/L (ref 22–29)
Calcium: 9.9 mg/dL (ref 8.4–10.4)
Chloride: 110 mEq/L — ABNORMAL HIGH (ref 98–109)
Creatinine: 0.9 mg/dL (ref 0.6–1.1)
EGFR: 70 mL/min/{1.73_m2} — ABNORMAL LOW (ref >90)
Glucose: 90 mg/dl (ref 70–140)
Potassium: 4 mEq/L (ref 3.5–5.1)
Sodium: 138 mEq/L (ref 136–145)
Total Bilirubin: 0.32 mg/dL (ref 0.20–1.20)
Total Protein: 7 g/dL (ref 6.4–8.3)

## 2016-10-08 LAB — CBC & DIFF AND RETIC
BASO%: 0.9 % (ref 0.0–2.0)
Basophils Absolute: 0.1 10*3/uL (ref 0.0–0.1)
EOS%: 2.4 % (ref 0.0–7.0)
Eosinophils Absolute: 0.2 10*3/uL (ref 0.0–0.5)
HCT: 32.8 % — ABNORMAL LOW (ref 34.8–46.6)
HGB: 9.3 g/dL — ABNORMAL LOW (ref 11.6–15.9)
Immature Retic Fract: 13.7 % — ABNORMAL HIGH (ref 1.60–10.00)
LYMPH%: 22.9 % (ref 14.0–49.7)
MCH: 18.8 pg — ABNORMAL LOW (ref 25.1–34.0)
MCHC: 28.3 g/dL — ABNORMAL LOW (ref 31.5–36.0)
MCV: 66.6 fL — ABNORMAL LOW (ref 79.5–101.0)
MONO#: 0.3 10*3/uL (ref 0.1–0.9)
MONO%: 3 % (ref 0.0–14.0)
NEUT#: 6 10*3/uL (ref 1.5–6.5)
NEUT%: 70.8 % (ref 38.4–76.8)
Platelets: 444 10*3/uL — ABNORMAL HIGH (ref 145–400)
RBC: 4.92 10*6/uL (ref 3.70–5.45)
RDW: 18.4 % — ABNORMAL HIGH (ref 11.2–14.5)
Retic %: 1.35 % (ref 0.70–2.10)
Retic Ct Abs: 66.42 10*3/uL (ref 33.70–90.70)
WBC: 8.5 10*3/uL (ref 3.9–10.3)
lymph#: 1.9 10*3/uL (ref 0.9–3.3)

## 2016-10-08 LAB — IRON AND TIBC
%SAT: 3 % — ABNORMAL LOW (ref 21–57)
Iron: 17 ug/dL — ABNORMAL LOW (ref 41–142)
TIBC: 565 ug/dL — ABNORMAL HIGH (ref 236–444)
UIBC: 548 ug/dL — ABNORMAL HIGH (ref 120–384)

## 2016-10-08 LAB — FERRITIN: Ferritin: <4 ng/ml — ABNORMAL LOW (ref 9–269)

## 2016-10-08 NOTE — Patient Instructions (Signed)
Thank you for choosing [facility] to provide your oncology and hematology care.  To afford each patient quality time with our providers, please arrive 30 minutes before your scheduled appointment time.  If you arrive late for your appointment, you may be asked to reschedule.  We strive to give you quality time with our providers, and arriving late affects you and other patients whose appointments are after yours.  If you are a no show for multiple scheduled visits, you may be dismissed from the clinic at the providers discretion.   Again, thank you for choosing [facility], our hope is that these requests will decrease the amount of time that you wait before being seen by our physicians.  ______________________________________________________________________ Should you have questions after your visit to the [facility], please contact our office at [phone] between the hours of 8:30 and 4:30 p.m.    Voicemails left after 4:30p.m will not be returned until the following business day.   For prescription refill requests, please have your pharmacy contact us directly.  Please also try to allow 48 hours for prescription requests.   Please contact the scheduling department for questions regarding scheduling.  For scheduling of procedures such as PET scans, CT scans, MRI, Ultrasound, etc please contact central scheduling at [phone].   Resources For Cancer Patients and Caregivers:  American Cancer Society:  800-227-2345  Can help patients locate various types of support and financial assistance Cancer Care: 1-800-813-HOPE (4673) Provides financial assistance, online support groups, medication/co-pay assistance.   [place] DSS:  336-641-3447 Where to apply for food stamps, Medicaid, and utility assistance Medicare Rights Center: 800-333-4114 Helps people with Medicare understand their rights and benefits, navigate the Medicare system, and secure the  quality healthcare they deserve SCAT: 336-333-6589 [place] [employer]'s shared-ride transportation service for eligible riders who have a disability that prevents them from riding the fixed route bus.   For additional information on assistance programs please contact our social worker:   Grier Hock/Abigail Elmore:  [phone] 

## 2016-10-08 NOTE — Telephone Encounter (Signed)
Scheduled appt per 5/10 los - lab add on for today .Gave patient AVS and calender .

## 2016-10-09 LAB — VITAMIN B12: Vitamin B12: 218 pg/mL — ABNORMAL LOW (ref 232–1245)

## 2016-10-09 LAB — ANTI-PARIETAL ANTIBODY: Parietal Cell Ab: 12 Units (ref 0.0–20.0)

## 2016-10-09 LAB — INTRINSIC FACTOR ANTIBODIES: Intrinsic Factor Abs, Serum: 1 AU/mL (ref 0.0–1.1)

## 2016-10-09 LAB — VITAMIN D 25 HYDROXY (VIT D DEFICIENCY, FRACTURES): Vitamin D, 25-Hydroxy: 6.6 ng/mL — ABNORMAL LOW (ref 30.0–100.0)

## 2016-10-13 ENCOUNTER — Ambulatory Visit (HOSPITAL_BASED_OUTPATIENT_CLINIC_OR_DEPARTMENT_OTHER): Payer: 59

## 2016-10-13 VITALS — BP 155/84 | HR 68 | Temp 98.9°F | Resp 20

## 2016-10-13 DIAGNOSIS — D509 Iron deficiency anemia, unspecified: Secondary | ICD-10-CM | POA: Diagnosis not present

## 2016-10-13 DIAGNOSIS — D508 Other iron deficiency anemias: Secondary | ICD-10-CM

## 2016-10-13 MED ORDER — B COMPLEX VITAMINS PO CAPS: 1.0000 | ORAL_CAPSULE | Freq: Every day | ORAL | 2 refills | Status: DC

## 2016-10-13 MED ORDER — B-12 1000 MCG SL SUBL: 2000.0000 ug | SUBLINGUAL_TABLET | Freq: Every day | SUBLINGUAL | 3 refills | Status: DC

## 2016-10-13 MED ORDER — SODIUM CHLORIDE 0.9 % IV SOLN
750.0000 mg | Freq: Once | INTRAVENOUS | Status: AC
  Administered 2016-10-13: 750 mg via INTRAVENOUS
  Filled 2016-10-13: qty 15

## 2016-10-13 MED ORDER — ERGOCALCIFEROL 1.25 MG (50000 UT) PO CAPS: 50000.0000 [IU] | ORAL_CAPSULE | ORAL | 0 refills | Status: DC

## 2016-10-13 MED ORDER — DIPHENHYDRAMINE HCL 25 MG PO CAPS
ORAL_CAPSULE | ORAL | Status: AC
  Filled 2016-10-13: qty 1

## 2016-10-13 MED ORDER — FAMOTIDINE 20 MG PO TABS
20.0000 mg | ORAL_TABLET | Freq: Once | ORAL | Status: AC
  Administered 2016-10-13: 20 mg via ORAL

## 2016-10-13 MED ORDER — DIPHENHYDRAMINE HCL 25 MG PO TABS
25.0000 mg | ORAL_TABLET | Freq: Once | ORAL | Status: AC
  Administered 2016-10-13: 25 mg via ORAL
  Filled 2016-10-13: qty 1

## 2016-10-13 MED ORDER — FAMOTIDINE 20 MG PO TABS
ORAL_TABLET | ORAL | Status: AC
  Filled 2016-10-13: qty 1

## 2016-10-13 NOTE — Progress Notes (Signed)
Marland Kitchen    HEMATOLOGY/ONCOLOGY CONSULT NOTE  Date of Service:   Patient Care Team: Maurice Small, MD as PCP - General (Family Medicine)  CHIEF COMPLAINTS/PURPOSE OF CONSULTATION:  Iron deficiency ANemia  HISTORY OF PRESENTING ILLNESS:  Theresa Clay is a wonderful 61 y.o. female who has been referred to [place] by Dr Maurice Small, MD  for evaluation and management of Iron deficiency Anemia to consider IV Iron infusion.  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  Patient has recently noted significantly increased fatigue and exhaustion. She notes that she has developed food craving for boiled potatoes. Labs done with her primary care physician recently on 09/22/2016 showed a hemoglobin of 9.2 with a ferritin of 3.2 she notes no overt GI bleeding. Likely has absorption issues related to her Roux-en-Y gastric bypass surgery.   She was previously on by mouth iron replacement for the last 3 years but stopped it due to GI intolerance. Has been on daily multivitamin but no specific B12 or other B vitamin replacements at this time . Patient notes that she has had B12 deficiency in the past and was on B12 shots until 4 years ago. Has been on calcium and vitamin D.   Patient notes no overt GI bleeding no hematemesis no melena no hematochezia no hematuria no epistaxis or gum bleeding. Patient notes she last had a colonoscopy about 2 years ago and whether to have a benign polyp but no other issues with GI bleeding. Had an EGD in 2014 which was unrevealing.  Patient notes that she doesn't think she can tolerate oral iron replacement and really wants her iron replaced since she feels "lousy".  No acute weight loss. No other new focal symptoms.    MEDICAL HISTORY:  Past Medical History:  Diagnosis Date  . Anxiety   .  Bipolar 1 disorder ([place])   . Breast cancer ([place]) 12/13/07   left , ER/PR +  . Depression   . Esophageal reflux   . Fibromyalgia   . GERD (gastroesophageal reflux disease)   . Hx of radiation therapy 04/04/08-05/17/08   left breast, tumor bed  . Hyperlipemia   . 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 Hypothyroidism   . Osteoarthritis   History of PCOS status post hysterectomy with bilateral salpingo-oophorectomy 07/1996.     SURGICAL HISTORY: Past Surgical History:  Procedure Laterality Date  . BREAST BIOPSY Left 01/09/2008  . BREAST LUMPECTOMY Left 01/2008  . CHOLECYSTECTOMY    . GASTRIC BYPASS    . KNEE ARTHROSCOPY Left 08/09/2012   Procedure: ARTHROSCOPY LEFT KNEE chondroplasty;  Surgeon: Hessie Dibble, MD;  Location: [facility];  Service: Orthopedics;  Laterality: Left;  . KNEE SURGERY  10/2011   reconstruction  . TONSILLECTOMY    . TOTAL ABDOMINAL HYSTERECTOMY W/ BILATERAL SALPINGOOPHORECTOMY      SOCIAL HISTORY: Social History   Social History  . Marital status: Married    Spouse name: N/A  . Number of children: 1  . Years of education: college   Occupational History  . disabled     for [employer]   Social History Main Topics  . Smoking status: Current Every Day Smoker    Packs/day: 0.50    Years: 5.00  . Smokeless tobacco: Never Used     Comment: 5-6 cigarettes daily  . Alcohol use No  .  Drug use: No  . Sexual activity: Yes    Birth control/ protection: Surgical   Other Topics Concern  . Not on file   Social History Narrative  . No narrative on file    FAMILY HISTORY: Family History  Problem Relation Age of Onset  . Breast cancer Mother 45  . Melanoma Mother   . Dementia Father   . Cancer - Prostate Maternal Grandfather     ALLERGIES:  is allergic to darvocet [propoxyphene n-acetaminophen]; penicillins; codeine; hydrocodone; oxycodone; and simvastatin.  MEDICATIONS:  Current Outpatient Prescriptions  Medication Sig Dispense Refill  .  amphetamine-dextroamphetamine (ADDERALL XR) 20 MG 24 hr capsule Take 20 mg by mouth 2 (two) times daily.    Marland Kitchen levothyroxine (SYNTHROID, LEVOTHROID) 100 MCG tablet Take 100 mcg by mouth at bedtime.    Marland Kitchen LIDODERM 5 % Place 1-3 patches onto the skin every 12 (twelve) hours as needed (pain).     Marland Kitchen lithium carbonate 300 MG capsule Take 300-600 mg by mouth 2 (two) times daily. One tablet in am two tablets pm    . LORazepam (ATIVAN) 1 MG tablet Take 0.5-1 mg by mouth 2 (two) times daily. Take 1mg  at night and 0.5mg  in the morning    . omeprazole (PRILOSEC) 40 MG capsule 40 mg daily.     Marland Kitchen tiZANidine (ZANAFLEX) 4 MG capsule Take 4 mg by mouth daily. Take daily    . valACYclovir (VALTREX) 500 MG tablet Take 500 mg by mouth daily. One tablet every 12 hours as needed     No current facility-administered medications for this visit.     REVIEW OF SYSTEMS:    10 Point review of Systems was done is negative except as noted above.  PHYSICAL EXAMINATION: ECOG PERFORMANCE STATUS: 2 - Symptomatic, <50% confined to bed  . Vitals:   10/08/16 1107  BP: (!) 156/89  Pulse: 74  Resp: 18  Temp: 98.7 F (37.1 C)   Filed Weights   10/08/16 1107  Weight: 175 lb 4 oz (79.5 kg)   .Body mass index is 30.08 kg/m.  GENERAL:alert, in no acute distress and comfortable SKIN: no acute rashes, no significant lesions EYES: conjunctiva are pink and non-injected, sclera anicteric OROPHARYNX: MMM, no exudates, no oropharyngeal erythema or ulceration NECK: supple, no JVD LYMPH:  no palpable lymphadenopathy in the cervical, axillary or inguinal regions LUNGS: clear to auscultation b/l with normal respiratory effort HEART: regular rate & rhythm ABDOMEN:  normoactive bowel sounds , non tender, not distended.No palpable hepatosplenomegaly  Extremity: no pedal edema PSYCH: alert & oriented x 3 with fluent speech NEURO: no focal motor/sensory deficits  LABORATORY DATA:  I have reviewed the data as listed  . CBC  Latest Ref Rng & Units 10/08/2016 04/02/2014 04/01/2014  WBC 3.9 - 10.3 10e3/uL 8.5 10.9(H) 13.8(H)  Hemoglobin 11.6 - 15.9 g/dL 9.3(L) 11.1(L) 11.2(L)  Hematocrit 34.8 - 46.6 % 32.8(L) 34.8(L) 35.2(L)  Platelets 145 - 400 10e3/uL 444(H) 240 261   . CBC    Component Value Date/Time   WBC 8.5 10/08/2016 1242   WBC 10.9 (H) 04/02/2014 0700   RBC 4.92 10/08/2016 1242   RBC 3.96 04/02/2014 0700   HGB 9.3 (L) 10/08/2016 1242   HCT 32.8 (L) 10/08/2016 1242   PLT 444 (H) 10/08/2016 1242   MCV 66.6 (L) 10/08/2016 1242   MCH 18.8 (L) 10/08/2016 1242   MCH 28.0 04/02/2014 0700   MCHC 28.3 (L) 10/08/2016 1242   MCHC 31.9 04/02/2014 0700  RDW 18.4 (H) 10/08/2016 1242   LYMPHSABS 1.9 10/08/2016 1242   MONOABS 0.3 10/08/2016 1242   EOSABS 0.2 10/08/2016 1242   BASOSABS 0.1 10/08/2016 1242     . CMP Latest Ref Rng & Units 10/08/2016 04/02/2014 04/01/2014  Glucose 70 - 140 mg/dl 90 109(H) 103(H)  BUN 7.0 - 26.0 mg/dL 7.8 5(L) 4(L)  Creatinine 0.6 - 1.1 mg/dL 0.9 0.74 0.77  Sodium 136 - 145 mEq/L 138 136(L) 142  Potassium 3.5 - 5.1 mEq/L 4.0 3.9 4.2  Chloride 96 - 112 mEq/L - 102 108  CO2 22 - 29 mEq/L 22 24 24   Calcium 8.4 - 10.4 mg/dL 9.9 9.2 9.1  Total Protein 6.4 - 8.3 g/dL 7.0 5.6(L) 5.8(L)  Total Bilirubin 0.20 - 1.20 mg/dL 0.32 0.2(L) 0.3  Alkaline Phos 40 - 150 U/L 137 110 124(H)  AST 5 - 34 U/L 13 8 12   ALT 0 - 55 U/L 12 43(H) 67(H)   . Lab Results  Component Value Date   IRON 17 (L) 10/08/2016   TIBC 565 (H) 10/08/2016   IRONPCTSAT 3 (L) 10/08/2016   (Iron and TIBC)  Lab Results  Component Value Date   FERRITIN <4 (L) 10/08/2016   Component     Latest Ref Rng & Units 10/08/2016  Vitamin B12     232 - 1,245 pg/mL 218 (L)  Parietal Cell Ab     0.0 - 20.0 Units 12.0  Intrinsic Factor Abs, Serum     0.0 - 1.1 AU/mL 1.0  Vitamin D, 25-Hydroxy     30.0 - 100.0 ng/mL 6.6 (L)     RADIOGRAPHIC STUDIES: I have personally reviewed the radiological images as listed and  agreed with the findings in the report. No results found.  ASSESSMENT & PLAN:    61 year old female with  #1 Severe iron deficiency anemia Hemoglobin of 9.3 with an MCV of 67. Likely due to poor iron absorption in the setting of chronic PPI therapy and history of Roux-en-Y gastric bypass surgery. No overt issues with GI bleeding. Absorption seems the primary issue since the patient also has coexistent B12 deficiency and vitamin D deficiency.  #2 vitamin B12 deficiency. Likely related to poor absorption due to chronic PPI therapy and Roux-en-Y gastric bypass surgery. Antiparietal cell and anti-intrinsic factor antibodies negative - suggest against pernicious anemia. No clinical evidence of gluten intolerance.  #3 severe vitamin D deficiency Likely due to poor absorption due to chronic PPI therapy and Roux-en-Y gastric bypass surgery.  Plan -Discussed in detail with the patient the fact that her iron deficiency is very severe with undetectable ferritin levels. -Informed consent of potential her for IV iron. -We shall treat with IV Injectafer 750 mg weekly x 2 doses. -Patient to start vitamin B12 liquid sublingual 2000 g sublingually daily. -Would recommend additional oral vitamin B complex 1 capsule by mouth daily to address any other B vitamin deficiencies that might be coexistent. -Ergocalciferol 50,000 units twice weekly for 12 weeks. Continue further treatments per primary care physician.  Labs today IV Injectafer 750mg  weekly x 2 doses ASAP in 5-7 days RTC with Dr Irene Limbo in 8 weeks with labs  All of the patients questions were answered with apparent satisfaction. The patient knows to call the clinic with any problems, questions or concerns.  I spent 40 minutes counseling the patient face to face. The total time spent in the appointment was 60 minutes and more than 50% was on counseling and direct patient cares.  Sullivan Lone MD [place] AAHIVMS [facility] Hematology/Oncology  Physician [facility]  (Office):       [phone] (Work cell):  [phone] (Fax):           [phone]

## 2016-10-13 NOTE — Patient Instructions (Signed)
Ferric carboxymaltose injection What is this medicine? FERRIC CARBOXYMALTOSE (ferr-ik car-box-ee-mol-toes) is an iron complex. Iron is used to make healthy red blood cells, which carry oxygen and nutrients throughout the body. This medicine is used to treat anemia in people with chronic kidney disease or people who cannot take iron by mouth. This medicine may be used for other purposes; ask your health care provider or pharmacist if you have questions. COMMON BRAND NAME(S): Injectafer What should I tell my health care provider before I take this medicine? They need to know if you have any of these conditions: -anemia not caused by low iron levels -high levels of iron in the blood -liver disease -an unusual or allergic reaction to iron, other medicines, foods, dyes, or preservatives -pregnant or trying to get pregnant -breast-feeding How should I use this medicine? This medicine is for infusion into a vein. It is given by a health care professional in a hospital or clinic setting. Talk to your pediatrician regarding the use of this medicine in children. Special care may be needed. Overdosage: If you think you have taken too much of this medicine contact a poison control center or emergency room at once. NOTE: This medicine is only for you. Do not share this medicine with others. What if I miss a dose? It is important not to miss your dose. Call your doctor or health care professional if you are unable to keep an appointment. What may interact with this medicine? Do not take this medicine with any of the following medications: -deferoxamine -dimercaprol -other iron products This medicine may also interact with the following medications: -chloramphenicol -deferasirox This list may not describe all possible interactions. Give your health care provider a list of all the medicines, herbs, non-prescription drugs, or dietary supplements you use. Also tell them if you smoke, drink alcohol, or use  illegal drugs. Some items may interact with your medicine. What should I watch for while using this medicine? Visit your doctor or health care professional regularly. Tell your doctor if your symptoms do not start to get better or if they get worse. You may need blood work done while you are taking this medicine. You may need to follow a special diet. Talk to your doctor. Foods that contain iron include: whole grains/cereals, dried fruits, beans, or peas, leafy green vegetables, and organ meats (liver, kidney). What side effects may I notice from receiving this medicine? Side effects that you should report to your doctor or health care professional as soon as possible: -allergic reactions like skin rash, itching or hives, swelling of the face, lips, or tongue -breathing problems -changes in blood pressure -feeling faint or lightheaded, falls -flushing, sweating, or hot feelings Side effects that usually do not require medical attention (report to your doctor or health care professional if they continue or are bothersome): -changes in taste -constipation -dizziness -headache -nausea -pain, redness, or irritation at site where injected -vomiting This list may not describe all possible side effects. Call your doctor for medical advice about side effects. You may report side effects to FDA at 1-800-FDA-1088. Where should I keep my medicine? This drug is given in a hospital or clinic and will not be stored at home. NOTE: This sheet is a summary. It may not cover all possible information. If you have questions about this medicine, talk to your doctor, pharmacist, or health care provider.  2018 Elsevier/Gold Standard (2015-06-20 11:20:47)  

## 2016-10-14 ENCOUNTER — Telehealth: Payer: Self-pay | Admitting: *Deleted

## 2016-10-14 NOTE — Telephone Encounter (Signed)
Call and informed. Patient verbalized understanding.

## 2016-10-14 NOTE — Telephone Encounter (Signed)
-----   Message from Arty Baumgartner, RN sent at 10/14/2016 10:02 AM EDT ----- Regarding: FW: labs and meds   ----- Message ----- From: Brunetta Genera, MD Sent: 10/13/2016  11:14 PM To: Arty Baumgartner, RN Subject: labs and meds                                  Hi Loren, Could you plz let Ms Van know that is significantly B12 and Vit D deficient as well in addition to having severe iron deficiency.  Would recommend Patient to start vitamin B12 liquid sublingual 2000 g sublingually daily. Would recommend additional oral vitamin B complex 1 capsule by mouth daily to address any other B vitamin deficiencies that might be coexistent. -Ergocalciferol 50,000 units twice weekly for 12 weeks.  Sent to her pharmacy.  thx GK

## 2016-10-20 ENCOUNTER — Ambulatory Visit (HOSPITAL_BASED_OUTPATIENT_CLINIC_OR_DEPARTMENT_OTHER): Payer: 59

## 2016-10-20 VITALS — BP 143/72 | HR 73 | Temp 98.2°F | Resp 18

## 2016-10-20 DIAGNOSIS — D509 Iron deficiency anemia, unspecified: Secondary | ICD-10-CM

## 2016-10-20 DIAGNOSIS — D508 Other iron deficiency anemias: Secondary | ICD-10-CM

## 2016-10-20 MED ORDER — DIPHENHYDRAMINE HCL 25 MG PO TABS
25.0000 mg | ORAL_TABLET | Freq: Once | ORAL | Status: AC
  Administered 2016-10-20: 25 mg via ORAL
  Filled 2016-10-20: qty 1

## 2016-10-20 MED ORDER — FAMOTIDINE 20 MG PO TABS
20.0000 mg | ORAL_TABLET | Freq: Once | ORAL | Status: AC
  Administered 2016-10-20: 20 mg via ORAL

## 2016-10-20 MED ORDER — SODIUM CHLORIDE 0.9 % IV SOLN
750.0000 mg | Freq: Once | INTRAVENOUS | Status: AC
  Administered 2016-10-20: 750 mg via INTRAVENOUS
  Filled 2016-10-20: qty 15

## 2016-10-20 MED ORDER — FAMOTIDINE 20 MG PO TABS
ORAL_TABLET | ORAL | Status: AC
  Filled 2016-10-20: qty 1

## 2016-10-20 MED ORDER — DIPHENHYDRAMINE HCL 25 MG PO CAPS
ORAL_CAPSULE | ORAL | Status: AC
  Filled 2016-10-20: qty 1

## 2016-10-20 NOTE — Patient Instructions (Signed)
Ferric carboxymaltose injection What is this medicine? FERRIC CARBOXYMALTOSE (ferr-ik car-box-ee-mol-toes) is an iron complex. Iron is used to make healthy red blood cells, which carry oxygen and nutrients throughout the body. This medicine is used to treat anemia in people with chronic kidney disease or people who cannot take iron by mouth. This medicine may be used for other purposes; ask your health care provider or pharmacist if you have questions. COMMON BRAND NAME(S): Injectafer What should I tell my health care provider before I take this medicine? They need to know if you have any of these conditions: -anemia not caused by low iron levels -high levels of iron in the blood -liver disease -an unusual or allergic reaction to iron, other medicines, foods, dyes, or preservatives -pregnant or trying to get pregnant -breast-feeding How should I use this medicine? This medicine is for infusion into a vein. It is given by a health care professional in a hospital or clinic setting. Talk to your pediatrician regarding the use of this medicine in children. Special care may be needed. Overdosage: If you think you have taken too much of this medicine contact a poison control center or emergency room at once. NOTE: This medicine is only for you. Do not share this medicine with others. What if I miss a dose? It is important not to miss your dose. Call your doctor or health care professional if you are unable to keep an appointment. What may interact with this medicine? Do not take this medicine with any of the following medications: -deferoxamine -dimercaprol -other iron products This medicine may also interact with the following medications: -chloramphenicol -deferasirox This list may not describe all possible interactions. Give your health care provider a list of all the medicines, herbs, non-prescription drugs, or dietary supplements you use. Also tell them if you smoke, drink alcohol, or use  illegal drugs. Some items may interact with your medicine. What should I watch for while using this medicine? Visit your doctor or health care professional regularly. Tell your doctor if your symptoms do not start to get better or if they get worse. You may need blood work done while you are taking this medicine. You may need to follow a special diet. Talk to your doctor. Foods that contain iron include: whole grains/cereals, dried fruits, beans, or peas, leafy green vegetables, and organ meats (liver, kidney). What side effects may I notice from receiving this medicine? Side effects that you should report to your doctor or health care professional as soon as possible: -allergic reactions like skin rash, itching or hives, swelling of the face, lips, or tongue -breathing problems -changes in blood pressure -feeling faint or lightheaded, falls -flushing, sweating, or hot feelings Side effects that usually do not require medical attention (report to your doctor or health care professional if they continue or are bothersome): -changes in taste -constipation -dizziness -headache -nausea -pain, redness, or irritation at site where injected -vomiting This list may not describe all possible side effects. Call your doctor for medical advice about side effects. You may report side effects to FDA at 1-800-FDA-1088. Where should I keep my medicine? This drug is given in a hospital or clinic and will not be stored at home. NOTE: This sheet is a summary. It may not cover all possible information. If you have questions about this medicine, talk to your doctor, pharmacist, or health care provider.  2018 Elsevier/Gold Standard (2015-06-20 11:20:47)  

## 2016-10-28 DIAGNOSIS — F331 Major depressive disorder, recurrent, moderate: Secondary | ICD-10-CM | POA: Diagnosis not present

## 2016-12-14 ENCOUNTER — Ambulatory Visit: Payer: 59 | Admitting: Hematology

## 2016-12-14 ENCOUNTER — Other Ambulatory Visit: Payer: 59

## 2016-12-29 ENCOUNTER — Ambulatory Visit (HOSPITAL_BASED_OUTPATIENT_CLINIC_OR_DEPARTMENT_OTHER): Payer: 59 | Admitting: Hematology

## 2016-12-29 ENCOUNTER — Other Ambulatory Visit (HOSPITAL_BASED_OUTPATIENT_CLINIC_OR_DEPARTMENT_OTHER): Payer: 59

## 2016-12-29 ENCOUNTER — Telehealth: Payer: Self-pay

## 2016-12-29 VITALS — BP 146/82 | HR 86 | Temp 98.9°F | Resp 18 | Ht 64.0 in | Wt 174.5 lb

## 2016-12-29 DIAGNOSIS — E559 Vitamin D deficiency, unspecified: Secondary | ICD-10-CM | POA: Diagnosis not present

## 2016-12-29 DIAGNOSIS — D509 Iron deficiency anemia, unspecified: Secondary | ICD-10-CM

## 2016-12-29 DIAGNOSIS — Z9884 Bariatric surgery status: Secondary | ICD-10-CM

## 2016-12-29 DIAGNOSIS — E538 Deficiency of other specified B group vitamins: Secondary | ICD-10-CM

## 2016-12-29 LAB — CBC & DIFF AND RETIC
BASO%: 0.5 % (ref 0.0–2.0)
Basophils Absolute: 0 10*3/uL (ref 0.0–0.1)
EOS%: 3.1 % (ref 0.0–7.0)
Eosinophils Absolute: 0.3 10*3/uL (ref 0.0–0.5)
HCT: 48 % — ABNORMAL HIGH (ref 34.8–46.6)
HGB: 15 g/dL (ref 11.6–15.9)
Immature Retic Fract: 5.9 % (ref 1.60–10.00)
LYMPH%: 23.7 % (ref 14.0–49.7)
MCH: 28.1 pg (ref 25.1–34.0)
MCHC: 31.3 g/dL — ABNORMAL LOW (ref 31.5–36.0)
MCV: 89.9 fL (ref 79.5–101.0)
MONO#: 0.4 10*3/uL (ref 0.1–0.9)
MONO%: 4.5 % (ref 0.0–14.0)
NEUT#: 5.6 10*3/uL (ref 1.5–6.5)
NEUT%: 68.2 % (ref 38.4–76.8)
Platelets: 290 10*3/uL (ref 145–400)
RBC: 5.34 10*6/uL (ref 3.70–5.45)
RDW: 17.5 % — ABNORMAL HIGH (ref 11.2–14.5)
Retic %: 1.02 % (ref 0.70–2.10)
Retic Ct Abs: 54.47 10*3/uL (ref 33.70–90.70)
WBC: 8.1 10*3/uL (ref 3.9–10.3)
lymph#: 1.9 10*3/uL (ref 0.9–3.3)

## 2016-12-29 LAB — IRON AND TIBC
%SAT: 16 % — ABNORMAL LOW (ref 21–57)
Iron: 60 ug/dL (ref 41–142)
TIBC: 374 ug/dL (ref 236–444)
UIBC: 314 ug/dL (ref 120–384)

## 2016-12-29 LAB — COMPREHENSIVE METABOLIC PANEL
ALT: 69 U/L — ABNORMAL HIGH (ref 0–55)
AST: 70 U/L — ABNORMAL HIGH (ref 5–34)
Albumin: 4.2 g/dL (ref 3.5–5.0)
Alkaline Phosphatase: 129 U/L (ref 40–150)
Anion Gap: 9 mEq/L (ref 3–11)
BUN: 9.6 mg/dL (ref 7.0–26.0)
CO2: 21 mEq/L — ABNORMAL LOW (ref 22–29)
Calcium: 10.6 mg/dL — ABNORMAL HIGH (ref 8.4–10.4)
Chloride: 109 mEq/L (ref 98–109)
Creatinine: 1 mg/dL (ref 0.6–1.1)
EGFR: 62 mL/min/{1.73_m2} — ABNORMAL LOW (ref >90)
Glucose: 98 mg/dl (ref 70–140)
Potassium: 3.9 mEq/L (ref 3.5–5.1)
Sodium: 139 mEq/L (ref 136–145)
Total Bilirubin: 0.25 mg/dL (ref 0.20–1.20)
Total Protein: 7.3 g/dL (ref 6.4–8.3)

## 2016-12-29 LAB — FERRITIN: Ferritin: 74 ng/ml (ref 9–269)

## 2016-12-29 NOTE — Telephone Encounter (Signed)
appts made and avs printed for patient 

## 2016-12-30 LAB — VITAMIN B12: Vitamin B12: 615 pg/mL (ref 232–1245)

## 2017-01-18 NOTE — Progress Notes (Signed)
Marland Kitchen    HEMATOLOGY/ONCOLOGY CLINIC NOTE  Date of Service: .12/29/2016    Patient Care Team: Maurice Small, MD as PCP - General (Family Medicine)  CHIEF COMPLAINTS/PURPOSE OF CONSULTATION:  Iron deficiency ANemia  HISTORY OF PRESENTING ILLNESS:  Theresa Clay is a wonderful 61 y.o. female who has been referred to [place] by Dr Maurice Small, MD  for evaluation and management of Iron deficiency Anemia to consider IV Iron infusion.  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  Patient has recently noted significantly increased fatigue and exhaustion. She notes that she has developed food craving for boiled potatoes. Labs done with her primary care physician recently on 09/22/2016 showed a hemoglobin of 9.2 with a ferritin of 3.2 she notes no overt GI bleeding. Likely has absorption issues related to her Roux-en-Y gastric bypass surgery.   She was previously on by mouth iron replacement for the last 3 years but stopped it due to GI intolerance. Has been on daily multivitamin but no specific B12 or other B vitamin replacements at this time . Patient notes that she has had B12 deficiency in the past and was on B12 shots until 4 years ago. Has been on calcium and vitamin D.   Patient notes no overt GI bleeding no hematemesis no melena no hematochezia no hematuria no epistaxis or gum bleeding. Patient notes she last had a colonoscopy about 2 years ago and whether to have a benign polyp but no other issues with GI bleeding. Had an EGD in 2014 which was unrevealing.  Patient notes that she doesn't think she can tolerate oral iron replacement and really wants her iron replaced since she feels "lousy".  No acute weight loss. No other new focal symptoms.  INTERVAL HISTORY  Patient is here for f/u if her Iron deficiency  Anemia. She tolerated her IV Iron replacement without any issues and her hemoglobin has improved from 9.3 upto 15 . She notes that her energy levels feel much improved. Has not noted any overt GI bleeding. No other acute new symptoms.  MEDICAL HISTORY:  Past Medical History:  Diagnosis Date  . Anxiety   . Bipolar 1 disorder ([place])   . Breast cancer ([place]) 12/13/07   left , ER/PR +  . Depression   . Esophageal reflux   . Fibromyalgia   . GERD (gastroesophageal reflux disease)   . Hx of radiation therapy 04/04/08-05/17/08   left breast, tumor bed  . Hyperlipemia   . 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 Hypothyroidism   . Osteoarthritis   History of PCOS status post hysterectomy with bilateral salpingo-oophorectomy 07/1996.     SURGICAL HISTORY: Past Surgical History:  Procedure Laterality Date  . BREAST BIOPSY Left 01/09/2008  . BREAST LUMPECTOMY Left 01/2008  . CHOLECYSTECTOMY    . GASTRIC BYPASS    . KNEE ARTHROSCOPY Left 08/09/2012   Procedure: ARTHROSCOPY LEFT KNEE chondroplasty;  Surgeon: Hessie Dibble, MD;  Location: [place];  Service: Orthopedics;  Laterality: Left;  . KNEE SURGERY  10/2011   reconstruction  . TONSILLECTOMY    . TOTAL ABDOMINAL HYSTERECTOMY W/ BILATERAL SALPINGOOPHORECTOMY      SOCIAL HISTORY: Social History   Social History  . Marital status: Married    Spouse name: N/A  . Number of children: 1  . Years of education: college   Occupational History  .  disabled     for BPD   Social History Main Topics  . Smoking status: Current Every Day Smoker    Packs/day: 0.50    Years: 5.00  . Smokeless tobacco: Never Used     Comment: 5-6 cigarettes daily  . Alcohol use No  . Drug use: No  . Sexual activity: Yes    Birth control/ protection: Surgical   Other Topics Concern  . Not on file   Social History Narrative  . No narrative on file    FAMILY HISTORY: Family History  Problem Relation Age of Onset  . Breast cancer Mother  48  . Melanoma Mother   . Dementia Father   . Cancer - Prostate Maternal Grandfather     ALLERGIES:  is allergic to darvocet [propoxyphene n-acetaminophen]; penicillins; codeine; hydrocodone; oxycodone; and simvastatin.  MEDICATIONS:  Current Outpatient Prescriptions  Medication Sig Dispense Refill  . amphetamine-dextroamphetamine (ADDERALL XR) 20 MG 24 hr capsule Take 20 mg by mouth 2 (two) times daily.    Marland Kitchen b complex vitamins capsule Take 1 capsule by mouth daily. 30 capsule 2  . Cyanocobalamin (B-12) 1000 MCG SUBL Place 2,000 mcg under the tongue daily. 60 each 3  . ergocalciferol (VITAMIN D2) 50000 units capsule Take 1 capsule (50,000 Units total) by mouth 2 (two) times a week. 24 capsule 0  . levothyroxine (SYNTHROID, LEVOTHROID) 100 MCG tablet Take 100 mcg by mouth at bedtime.    Marland Kitchen LIDODERM 5 % Place 1-3 patches onto the skin every 12 (twelve) hours as needed (pain).     Marland Kitchen lithium carbonate 300 MG capsule Take 300-600 mg by mouth 2 (two) times daily. One tablet in am two tablets pm    . LORazepam (ATIVAN) 1 MG tablet Take 0.5-1 mg by mouth 2 (two) times daily. Take 1mg  at night and 0.5mg  in the morning    . omeprazole (PRILOSEC) 40 MG capsule 40 mg daily.     Marland Kitchen tiZANidine (ZANAFLEX) 4 MG capsule Take 4 mg by mouth daily. Take daily    . valACYclovir (VALTREX) 500 MG tablet Take 500 mg by mouth daily. One tablet every 12 hours as needed     No current facility-administered medications for this visit.     REVIEW OF SYSTEMS:    10 Point review of Systems was done is negative except as noted above.  PHYSICAL EXAMINATION: ECOG PERFORMANCE STATUS: 2 - Symptomatic, <50% confined to bed  . Vitals:   12/29/16 0949  BP: (!) 146/82  Pulse: 86  Resp: 18  Temp: 98.9 F (37.2 C)  SpO2: 100%   Filed Weights   12/29/16 0949  Weight: 174 lb 8 oz (79.2 kg)   .Body mass index is 29.95 kg/m.  GENERAL:alert, in no acute distress and comfortable SKIN: no acute rashes, no  significant lesions EYES: conjunctiva are pink and non-injected, sclera anicteric OROPHARYNX: MMM, no exudates, no oropharyngeal erythema or ulceration NECK: supple, no JVD LYMPH:  no palpable lymphadenopathy in the cervical, axillary or inguinal regions LUNGS: clear to auscultation b/l with normal respiratory effort HEART: regular rate & rhythm ABDOMEN:  normoactive bowel sounds , non tender, not distended.No palpable hepatosplenomegaly  Extremity: no pedal edema PSYCH: alert & oriented x 3 with fluent speech NEURO: no focal motor/sensory deficits  LABORATORY DATA:  I have reviewed the data as listed  . CBC Latest Ref Rng & Units 12/29/2016 10/08/2016 04/02/2014  WBC 3.9 - 10.3 10e3/uL 8.1 8.5 10.9(H)  Hemoglobin 11.6 - 15.9 g/dL 15.0  9.3(L) 11.1(L)  Hematocrit 34.8 - 46.6 % 48.0(H) 32.8(L) 34.8(L)  Platelets 145 - 400 10e3/uL 290 444(H) 240   . CBC    Component Value Date/Time   WBC 8.1 12/29/2016 0920   WBC 10.9 (H) 04/02/2014 0700   RBC 5.34 12/29/2016 0920   RBC 3.96 04/02/2014 0700   HGB 15.0 12/29/2016 0920   HCT 48.0 (H) 12/29/2016 0920   PLT 290 12/29/2016 0920   MCV 89.9 12/29/2016 0920   MCH 28.1 12/29/2016 0920   MCH 28.0 04/02/2014 0700   MCHC 31.3 (L) 12/29/2016 0920   MCHC 31.9 04/02/2014 0700   RDW 17.5 (H) 12/29/2016 0920   LYMPHSABS 1.9 12/29/2016 0920   MONOABS 0.4 12/29/2016 0920   EOSABS 0.3 12/29/2016 0920   BASOSABS 0.0 12/29/2016 0920     . CMP Latest Ref Rng & Units 12/29/2016 10/08/2016 04/02/2014  Glucose 70 - 140 mg/dl 98 90 109(H)  BUN 7.0 - 26.0 mg/dL 9.6 7.8 5(L)  Creatinine 0.6 - 1.1 mg/dL 1.0 0.9 0.74  Sodium 136 - 145 mEq/L 139 138 136(L)  Potassium 3.5 - 5.1 mEq/L 3.9 4.0 3.9  Chloride 96 - 112 mEq/L - - 102  CO2 22 - 29 mEq/L 21(L) 22 24  Calcium 8.4 - 10.4 mg/dL 10.6(H) 9.9 9.2  Total Protein 6.4 - 8.3 g/dL 7.3 7.0 5.6(L)  Total Bilirubin 0.20 - 1.20 mg/dL 0.25 0.32 0.2(L)  Alkaline Phos 40 - 150 U/L 129 137 110  AST 5 - 34 U/L  70(H) 13 8  ALT 0 - 55 U/L 69(H) 12 43(H)   . Lab Results  Component Value Date   IRON 60 12/29/2016   TIBC 374 12/29/2016   IRONPCTSAT 16 (L) 12/29/2016   (Iron and TIBC)  Lab Results  Component Value Date   FERRITIN 74 12/29/2016      RADIOGRAPHIC STUDIES: I have personally reviewed the radiological images as listed and agreed with the findings in the report. No results found.  ASSESSMENT & PLAN:    61 year old female with  #1 Severe iron deficiency anemia Hemoglobin of 9.3 with an MCV of 67. Likely due to poor iron absorption in the setting of chronic PPI therapy and history of Roux-en-Y gastric bypass surgery. No overt issues with GI bleeding. Absorption seems the primary issue since the patient also has coexistent B12 deficiency and vitamin D deficiency. Ferritin level have improved from <4 to 74 with corresponding normalization of patients Hgb level at 15  #2 vitamin B12 deficiency. Likely related to poor absorption due to chronic PPI therapy and Roux-en-Y gastric bypass surgery. Antiparietal cell and anti-intrinsic factor antibodies negative - suggest against pernicious anemia. No clinical evidence of gluten intolerance. B12 levels have improved from 218 to 615  #3 severe vitamin D deficiency Likely due to poor absorption due to chronic PPI therapy and Roux-en-Y gastric bypass surgery.  Plan -patient notes that she feels much better after the IV Iron. Ferritin levels at goals of >50 with normal hgb. -continue  vitamin B12 liquid sublingual 2000 g sublingually daily. -continue additional oral vitamin B complex 1 capsule by mouth daily to address any other B vitamin deficiencies that might be coexistent. -Ergocalciferol 50,000 units twice weekly for 12 weeks. Continue further treatments per primary care physician.   #4 . Patient Active Problem List   Diagnosis Date Noted  . Iron deficiency anemia 10/08/2016  . Diverticula of small intestine 03/29/2014  .  Diverticulitis of duodenum 03/29/2014  . 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  Breast cancer ([place])   . Hx of radiation therapy   . Bipolar 1 disorder ([place])   . Fibromyalgia   . Osteoarthritis    -continue f/u with PCP for mx of other medical co-morbidities.  Return to clinic with Dr. Irene Limbo in 6 months with repeat labs  All of the patients questions were answered with apparent satisfaction. The patient knows to call the clinic with any problems, questions or concerns.  I spent 20 minutes counseling the patient face to face. The total time spent in the appointment was 25 minutes and more than 50% was on counseling and direct patient cares.    Sullivan Lone MD [place] AAHIVMS [facility] Hematology/Oncology Physician [facility]  (Office):       [phone] (Work cell):  [phone] (Fax):           [phone]

## 2017-02-13 DIAGNOSIS — R21 Rash and other nonspecific skin eruption: Secondary | ICD-10-CM | POA: Diagnosis not present

## 2017-02-18 ENCOUNTER — Other Ambulatory Visit: Payer: Self-pay | Admitting: Hematology

## 2017-02-19 DIAGNOSIS — Z23 Encounter for immunization: Secondary | ICD-10-CM | POA: Diagnosis not present

## 2017-03-24 DIAGNOSIS — E039 Hypothyroidism, unspecified: Secondary | ICD-10-CM | POA: Diagnosis not present

## 2017-03-24 DIAGNOSIS — E119 Type 2 diabetes mellitus without complications: Secondary | ICD-10-CM | POA: Diagnosis not present

## 2017-03-24 DIAGNOSIS — E782 Mixed hyperlipidemia: Secondary | ICD-10-CM | POA: Diagnosis not present

## 2017-03-24 DIAGNOSIS — Z5181 Encounter for therapeutic drug level monitoring: Secondary | ICD-10-CM | POA: Diagnosis not present

## 2017-03-29 DIAGNOSIS — F331 Major depressive disorder, recurrent, moderate: Secondary | ICD-10-CM | POA: Diagnosis not present

## 2017-03-30 DIAGNOSIS — F3341 Major depressive disorder, recurrent, in partial remission: Secondary | ICD-10-CM | POA: Diagnosis not present

## 2017-03-31 DIAGNOSIS — D509 Iron deficiency anemia, unspecified: Secondary | ICD-10-CM | POA: Diagnosis not present

## 2017-04-05 DIAGNOSIS — S96912A Strain of unspecified muscle and tendon at ankle and foot level, left foot, initial encounter: Secondary | ICD-10-CM | POA: Diagnosis not present

## 2017-07-05 ENCOUNTER — Other Ambulatory Visit: Payer: 59

## 2017-07-05 ENCOUNTER — Ambulatory Visit: Payer: 59 | Admitting: Hematology

## 2017-07-23 ENCOUNTER — Other Ambulatory Visit: Payer: Self-pay | Admitting: Family Medicine

## 2017-07-23 DIAGNOSIS — Z1231 Encounter for screening mammogram for malignant neoplasm of breast: Secondary | ICD-10-CM

## 2017-07-27 DIAGNOSIS — F331 Major depressive disorder, recurrent, moderate: Secondary | ICD-10-CM | POA: Diagnosis not present

## 2017-07-29 DIAGNOSIS — F331 Major depressive disorder, recurrent, moderate: Secondary | ICD-10-CM | POA: Diagnosis not present

## 2017-08-03 ENCOUNTER — Ambulatory Visit: Payer: 59 | Admitting: Hematology

## 2017-08-03 ENCOUNTER — Other Ambulatory Visit: Payer: 59

## 2017-08-03 ENCOUNTER — Other Ambulatory Visit: Payer: Self-pay | Admitting: *Deleted

## 2017-08-03 DIAGNOSIS — D508 Other iron deficiency anemias: Secondary | ICD-10-CM

## 2017-09-10 ENCOUNTER — Telehealth: Payer: Self-pay | Admitting: Hematology

## 2017-09-10 NOTE — Telephone Encounter (Signed)
Patient called in to reschedule  °

## 2017-09-27 DIAGNOSIS — L814 Other melanin hyperpigmentation: Secondary | ICD-10-CM | POA: Diagnosis not present

## 2017-09-27 DIAGNOSIS — L570 Actinic keratosis: Secondary | ICD-10-CM | POA: Diagnosis not present

## 2017-09-27 DIAGNOSIS — D229 Melanocytic nevi, unspecified: Secondary | ICD-10-CM | POA: Diagnosis not present

## 2017-09-27 DIAGNOSIS — L821 Other seborrheic keratosis: Secondary | ICD-10-CM | POA: Diagnosis not present

## 2017-09-28 ENCOUNTER — Ambulatory Visit
Admission: RE | Admit: 2017-09-28 | Discharge: 2017-09-28 | Disposition: A | Discharge status: Home / Self Care | Payer: Medicare Other | Source: Ambulatory Visit | Attending: Family Medicine | Admitting: Family Medicine

## 2017-09-28 DIAGNOSIS — Z1231 Encounter for screening mammogram for malignant neoplasm of breast: Secondary | ICD-10-CM

## 2017-09-28 HISTORY — DX: Personal history of irradiation: Z92.3

## 2017-10-01 DIAGNOSIS — E119 Type 2 diabetes mellitus without complications: Secondary | ICD-10-CM | POA: Diagnosis not present

## 2017-10-01 DIAGNOSIS — E039 Hypothyroidism, unspecified: Secondary | ICD-10-CM | POA: Diagnosis not present

## 2017-10-01 DIAGNOSIS — E782 Mixed hyperlipidemia: Secondary | ICD-10-CM | POA: Diagnosis not present

## 2017-10-01 DIAGNOSIS — Z0000 Encounter for general adult medical examination without abnormal findings: Secondary | ICD-10-CM | POA: Diagnosis not present

## 2017-10-01 DIAGNOSIS — D509 Iron deficiency anemia, unspecified: Secondary | ICD-10-CM | POA: Diagnosis not present

## 2017-10-07 NOTE — Progress Notes (Signed)
Marland Kitchen    HEMATOLOGY/ONCOLOGY CLINIC NOTE  Date of Service: 10/11/17    Patient Care Team: Maurice Small, MD as PCP - General (Family Medicine)  CHIEF COMPLAINTS/PURPOSE OF CONSULTATION:  Iron deficiency Anemia  HISTORY OF PRESENTING ILLNESS:  Theresa Clay is a wonderful 62 y.o. female who has been referred to [place] by Dr Maurice Small, MD  for evaluation and management of Iron deficiency Anemia to consider IV Iron infusion.  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  Patient has recently noted significantly increased fatigue and exhaustion. She notes that she has developed food craving for boiled potatoes. Labs done with her primary care physician recently on 09/22/2016 showed a hemoglobin of 9.2 with a ferritin of 3.2 she notes no overt GI bleeding. Likely has absorption issues related to her Roux-en-Y gastric bypass surgery.   She was previously on by mouth iron replacement for the last 3 years but stopped it due to GI intolerance. Has been on daily multivitamin but no specific B12 or other B vitamin replacements at this time . Patient notes that she has had B12 deficiency in the past and was on B12 shots until 4 years ago. Has been on calcium and vitamin D.   Patient notes no overt GI bleeding no hematemesis no melena no hematochezia no hematuria no epistaxis or gum bleeding. Patient notes she last had a colonoscopy about 2 years ago and whether to have a benign polyp but no other issues with GI bleeding. Had an EGD in 2014 which was unrevealing.  Patient notes that she doesn't think she can tolerate oral iron replacement and really wants her iron replaced since she feels "lousy".  No acute weight loss. No other new focal symptoms.  INTERVAL HISTORY  Patient is here for f/u if her Iron deficiency Anemia.  The patient's last visit with [place] was on 12/29/16. She is accompanied today by her older sister. The pt reports that she is doing well overall.   The pt reports that she has felt tired. She stopped taking her folic acid, F62 and Vitamin B complex. She has been regular with her thyroid medication. She take 900mg  Lithium daily as well.   She notes that she has not been eating a very well balanced diet and notes that she is eating less overall.   Lab results today (10/11/17) of CBC, CMP, and Reticulocytes is as follows: all values are WNL except for Ferritin 10/11/17 is at 47.  Vitamin D 10/11/17 is 10.1 Vitamin B12 10/11/17 is 135 Iron/TIBC 10/11/17 is WNL.   On review of systems, pt reports some fatigue, decreased appetite, and denies any other symptoms.     MEDICAL HISTORY:  Past Medical History:  Diagnosis Date  . Anxiety   . Bipolar 1 disorder ([place])   . Breast cancer ([place]) 12/13/07   left , ER/PR +  . Depression   . Esophageal reflux   . Fibromyalgia   . GERD (gastroesophageal reflux disease)   . Hx of radiation therapy 04/04/08-05/17/08   left breast, tumor bed  . Hyperlipemia   . 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 Hypothyroidism   . Osteoarthritis   . Personal history of radiation therapy 2009  History of PCOS status post hysterectomy with bilateral salpingo-oophorectomy 07/1996.     SURGICAL HISTORY: Past Surgical History:  Procedure Laterality Date  . BREAST  BIOPSY Left 01/09/2008  . BREAST LUMPECTOMY Left 01/2008  . CHOLECYSTECTOMY    . GASTRIC BYPASS    . KNEE ARTHROSCOPY Left 08/09/2012   Procedure: ARTHROSCOPY LEFT KNEE chondroplasty;  Surgeon: Hessie Dibble, MD;  Location: [place];  Service: Orthopedics;  Laterality: Left;  . KNEE SURGERY  10/2011   reconstruction  . TONSILLECTOMY    . TOTAL ABDOMINAL HYSTERECTOMY W/ BILATERAL SALPINGOOPHORECTOMY      SOCIAL HISTORY: Social History   Socioeconomic History  . Marital status: Married    Spouse  name: Not on file  . Number of children: 1  . Years of education: college  . Highest education level: Not on file  Occupational History  . Occupation: disabled    Comment: for BPD  Social Needs  . Financial resource strain: Not on file  . Food insecurity:    Worry: Not on file    Inability: Not on file  . Transportation needs:    Medical: Not on file    Non-medical: Not on file  Tobacco Use  . Smoking status: Current Every Day Smoker    Packs/day: 0.50    Years: 5.00    Pack years: 2.50  . Smokeless tobacco: Never Used  . Tobacco comment: 5-6 cigarettes daily  Substance and Sexual Activity  . Alcohol use: No  . Drug use: No  . Sexual activity: Yes    Birth control/protection: Surgical  Lifestyle  . Physical activity:    Days per week: Not on file    Minutes per session: Not on file  . Stress: Not on file  Relationships  . Social connections:    Talks on phone: Not on file    Gets together: Not on file    Attends religious service: Not on file    Active member of club or organization: Not on file    Attends meetings of clubs or organizations: Not on file    Relationship status: Not on file  . Intimate partner violence:    Fear of current or ex partner: Not on file    Emotionally abused: Not on file    Physically abused: Not on file    Forced sexual activity: Not on file  Other Topics Concern  . Not on file  Social History Narrative  . Not on file    FAMILY HISTORY: Family History  Problem Relation Age of Onset  . Breast cancer Mother 86  . Melanoma Mother   . Dementia Father   . Cancer - Prostate Maternal Grandfather     ALLERGIES:  is allergic to darvocet [propoxyphene n-acetaminophen]; penicillins; codeine; hydrocodone; oxycodone; and simvastatin.  MEDICATIONS:  Current Outpatient Medications  Medication Sig Dispense Refill  . amphetamine-dextroamphetamine (ADDERALL XR) 20 MG 24 hr capsule Take 20 mg by mouth 2 (two) times daily.    Marland Kitchen b complex  vitamins capsule Take 1 capsule by mouth daily. 30 capsule 2  . Cyanocobalamin (B-12) 1000 MCG SUBL Place 2,000 mcg under the tongue daily. 60 each 3  . ergocalciferol (VITAMIN D2) 50000 units capsule Take 1 capsule (50,000 Units total) by mouth 2 (two) times a week. 24 capsule 0  . levothyroxine (SYNTHROID, LEVOTHROID) 100 MCG tablet Take 100 mcg by mouth at bedtime.    Marland Kitchen LIDODERM 5 % Place 1-3 patches onto the skin every 12 (twelve) hours as needed (pain).     Marland Kitchen lithium carbonate 300 MG capsule Take 300-600 mg by mouth 2 (two) times daily. One tablet in  am two tablets pm    . LORazepam (ATIVAN) 1 MG tablet Take 0.5-1 mg by mouth 2 (two) times daily. Take 1mg  at night and 0.5mg  in the morning    . omeprazole (PRILOSEC) 40 MG capsule 40 mg daily.     Marland Kitchen tiZANidine (ZANAFLEX) 4 MG capsule Take 4 mg by mouth daily. Take daily    . valACYclovir (VALTREX) 500 MG tablet Take 500 mg by mouth daily. One tablet every 12 hours as needed     No current facility-administered medications for this visit.     REVIEW OF SYSTEMS:   A 10+ POINT REVIEW OF SYSTEMS WAS OBTAINED including neurology, dermatology, psychiatry, cardiac, respiratory, lymph, extremities, GI, GU, Musculoskeletal, constitutional, breasts, reproductive, HEENT.  All pertinent positives are noted in the HPI.  All others are negative.   PHYSICAL EXAMINATION: ECOG PERFORMANCE STATUS: 2 - Symptomatic, <50% confined to bed  . Vitals:   10/11/17 1339  BP: (!) 158/85  Pulse: 70  Resp: 17  Temp: 98.4 F (36.9 C)  SpO2: 100%   Filed Weights   10/11/17 1339  Weight: 179 lb 3.2 oz (81.3 kg)   .Body mass index is 30.76 kg/m.  GENERAL:alert, in no acute distress and comfortable SKIN: no acute rashes, no significant lesions EYES: conjunctiva are pink and non-injected, sclera anicteric OROPHARYNX: MMM, no exudates, no oropharyngeal erythema or ulceration NECK: supple, no JVD LYMPH:  no palpable lymphadenopathy in the cervical, axillary  or inguinal regions LUNGS: clear to auscultation b/l with normal respiratory effort HEART: regular rate & rhythm ABDOMEN:  normoactive bowel sounds , non tender, not distended. No palpable hepatosplenomegaly.  Extremity: no pedal edema PSYCH: alert & oriented x 3 with fluent speech NEURO: no focal motor/sensory deficits   LABORATORY DATA:  I have reviewed the data as listed  . CBC Latest Ref Rng & Units 12/29/2016 10/08/2016 04/02/2014  WBC 3.9 - 10.3 10e3/uL 8.1 8.5 10.9(H)  Hemoglobin 11.6 - 15.9 g/dL 15.0 9.3(L) 11.1(L)  Hematocrit 34.8 - 46.6 % 48.0(H) 32.8(L) 34.8(L)  Platelets 145 - 400 10e3/uL 290 444(H) 240   . CBC    Component Value Date/Time   WBC 8.3 10/11/2017 1322   WBC 8.1 12/29/2016 0920   WBC 10.9 (H) 04/02/2014 0700   RBC 4.83 10/11/2017 1322   RBC 4.83 10/11/2017 1322   HGB 14.1 10/11/2017 1322   HGB 15.0 12/29/2016 0920   HCT 44.1 10/11/2017 1322   HCT 48.0 (H) 12/29/2016 0920   PLT 314 10/11/2017 1322   PLT 290 12/29/2016 0920   MCV 91.3 10/11/2017 1322   MCV 89.9 12/29/2016 0920   MCH 29.2 10/11/2017 1322   MCHC 32.0 10/11/2017 1322   RDW 14.0 10/11/2017 1322   RDW 17.5 (H) 12/29/2016 0920   LYMPHSABS 1.7 10/11/2017 1322   LYMPHSABS 1.9 12/29/2016 0920   MONOABS 0.3 10/11/2017 1322   MONOABS 0.4 12/29/2016 0920   EOSABS 0.3 10/11/2017 1322   EOSABS 0.3 12/29/2016 0920   BASOSABS 0.0 10/11/2017 1322   BASOSABS 0.0 12/29/2016 0920     . CMP Latest Ref Rng & Units 10/11/2017 12/29/2016 10/08/2016  Glucose 70 - 140 mg/dL 95 98 90  BUN 7 - 26 mg/dL 12 9.6 7.8  Creatinine 0.60 - 1.10 mg/dL 1.00 1.0 0.9  Sodium 136 - 145 mmol/L 137 139 138  Potassium 3.5 - 5.1 mmol/L 4.2 3.9 4.0  Chloride 98 - 109 mmol/L 107 - -  CO2 22 - 29 mmol/L 25 21(L) 22  Calcium  8.4 - 10.4 mg/dL 10.0 10.6(H) 9.9  Total Protein 6.4 - 8.3 g/dL 7.1 7.3 7.0  Total Bilirubin 0.2 - 1.2 mg/dL 0.4 0.25 0.32  Alkaline Phos 40 - 150 U/L 124 129 137  AST 5 - 34 U/L 20 70(H) 13  ALT  0 - 55 U/L 18 69(H) 12   . Lab Results  Component Value Date   IRON 95 10/11/2017   TIBC 354 10/11/2017   IRONPCTSAT 27 10/11/2017   (Iron and TIBC)  Lab Results  Component Value Date   FERRITIN 47 10/11/2017        RADIOGRAPHIC STUDIES: I have personally reviewed the radiological images as listed and agreed with the findings in the report. Mm Screening Breast Tomo Bilateral  Result Date: 09/28/2017 CLINICAL DATA:  Screening. EXAM: DIGITAL SCREENING BILATERAL MAMMOGRAM WITH TOMO AND CAD COMPARISON:  Previous exam(s). ACR Breast Density Category b: There are scattered areas of fibroglandular density. FINDINGS: There are no findings suspicious for malignancy. Images were processed with CAD. IMPRESSION: No mammographic evidence of malignancy. A result letter of this screening mammogram will be mailed directly to the patient. RECOMMENDATION: Screening mammogram in one year. (Code:SM-B-01Y) BI-RADS CATEGORY  1: Negative. Electronically Signed   By: Ammie Ferrier M.D.   On: 09/28/2017 16:29    ASSESSMENT & PLAN:    62 year old female with  #1 Severe iron deficiency anemia Hemoglobin of 9.3 with an MCV of 67. Likely due to poor iron absorption in the setting of chronic PPI therapy and history of Roux-en-Y gastric bypass surgery. No overt issues with GI bleeding. Absorption seems the primary issue since the patient also has coexistent B12 deficiency and vitamin D deficiency. -Discussed pt labwork today, 10/11/17; Ferritin is at 47, Plan -will offer patient IV Injectafer x 1 to try to maintain ferritin>100  #2 vitamin B12 deficiency. Likely related to poor absorption due to chronic PPI therapy and Roux-en-Y gastric bypass surgery. Antiparietal cell and anti-intrinsic factor antibodies negative - suggest against pernicious anemia. No clinical evidence of gluten intolerance. B12 levels have improved from 218 to 615 Now down to 135 due to non compliance with SL B12 Plan -patient  to restart her B12 SL 2065mcg SL daily and B complex  #3 severe vitamin D deficiency - 10 Likely due to poor absorption due to chronic PPI therapy and Roux-en-Y gastric bypass surgery. Plan -Ergocalciferol 50,000 units twice weekly for 12 weeks. Continue further treatments per primary care physician.   #4 . Patient Active Problem List   Diagnosis Date Noted  . Iron deficiency anemia 10/08/2016  . Diverticula of small intestine 03/29/2014  . Diverticulitis of duodenum 03/29/2014  . 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 Breast cancer ([place])   . Hx of radiation therapy   . Bipolar 1 disorder ([place])   . Fibromyalgia   . Osteoarthritis    -continue f/u with PCP for mx of other medical co-morbidities.   RTC with Dr Irene Limbo in 6 months with labs   All of the patients questions were answered with apparent satisfaction. The patient knows to call the clinic with any problems, questions or concerns.  The toal time spent in the appt was 20 minutes and more than 50% was on counseling and direct patient cares.     Sullivan Lone MD [place] AAHIVMS [facility] [place] [facility] Hematology/Oncology Physician [facility]  (Office):       [phone] (Work cell):  [phone] (Fax):  [phone]  This document serves as a record of services personally performed by Sullivan Lone, MD. It was created on his behalf by Baldwin [place], a trained medical scribe. The creation of this record is based on the scribe's personal observations and the provider's statements to them.   .I have reviewed the above documentation for accuracy and completeness, and I agree with the above. Brunetta Genera MD MS

## 2017-10-11 ENCOUNTER — Inpatient Hospital Stay (HOSPITAL_BASED_OUTPATIENT_CLINIC_OR_DEPARTMENT_OTHER): Payer: 59 | Admitting: Hematology

## 2017-10-11 ENCOUNTER — Inpatient Hospital Stay: Payer: 59 | Attending: Hematology

## 2017-10-11 VITALS — BP 158/85 | HR 70 | Temp 98.4°F | Resp 17 | Ht 64.0 in | Wt 179.2 lb

## 2017-10-11 DIAGNOSIS — D500 Iron deficiency anemia secondary to blood loss (chronic): Secondary | ICD-10-CM

## 2017-10-11 DIAGNOSIS — D509 Iron deficiency anemia, unspecified: Secondary | ICD-10-CM

## 2017-10-11 DIAGNOSIS — D508 Other iron deficiency anemias: Secondary | ICD-10-CM

## 2017-10-11 DIAGNOSIS — Z79899 Other long term (current) drug therapy: Secondary | ICD-10-CM | POA: Diagnosis not present

## 2017-10-11 DIAGNOSIS — E559 Vitamin D deficiency, unspecified: Secondary | ICD-10-CM | POA: Diagnosis not present

## 2017-10-11 DIAGNOSIS — Z9884 Bariatric surgery status: Secondary | ICD-10-CM | POA: Diagnosis not present

## 2017-10-11 DIAGNOSIS — E538 Deficiency of other specified B group vitamins: Secondary | ICD-10-CM | POA: Insufficient documentation

## 2017-10-11 LAB — CMP (CANCER CENTER ONLY)
ALT: 18 U/L (ref 0–55)
AST: 20 U/L (ref 5–34)
Albumin: 4.2 g/dL (ref 3.5–5.0)
Alkaline Phosphatase: 124 U/L (ref 40–150)
Anion gap: 5 (ref 3–11)
BUN: 12 mg/dL (ref 7–26)
CO2: 25 mmol/L (ref 22–29)
Calcium: 10 mg/dL (ref 8.4–10.4)
Chloride: 107 mmol/L (ref 98–109)
Creatinine: 1 mg/dL (ref 0.60–1.10)
GFR, Est AFR Am: >60 mL/min (ref >60)
GFR, Estimated: >60 mL/min (ref >60)
Glucose, Bld: 95 mg/dL (ref 70–140)
Potassium: 4.2 mmol/L (ref 3.5–5.1)
Sodium: 137 mmol/L (ref 136–145)
Total Bilirubin: 0.4 mg/dL (ref 0.2–1.2)
Total Protein: 7.1 g/dL (ref 6.4–8.3)

## 2017-10-11 LAB — CBC WITH DIFFERENTIAL (CANCER CENTER ONLY)
Basophils Absolute: 0 10*3/uL (ref 0.0–0.1)
Basophils Relative: 0 %
Eosinophils Absolute: 0.3 10*3/uL (ref 0.0–0.5)
Eosinophils Relative: 3 %
HCT: 44.1 % (ref 34.8–46.6)
Hemoglobin: 14.1 g/dL (ref 11.6–15.9)
Lymphocytes Relative: 21 %
Lymphs Abs: 1.7 10*3/uL (ref 0.9–3.3)
MCH: 29.2 pg (ref 25.1–34.0)
MCHC: 32 g/dL (ref 31.5–36.0)
MCV: 91.3 fL (ref 79.5–101.0)
Monocytes Absolute: 0.3 10*3/uL (ref 0.1–0.9)
Monocytes Relative: 4 %
Neutro Abs: 5.9 10*3/uL (ref 1.5–6.5)
Neutrophils Relative %: 72 %
Platelet Count: 314 10*3/uL (ref 145–400)
RBC: 4.83 MIL/uL (ref 3.70–5.45)
RDW: 14 % (ref 11.2–14.5)
WBC Count: 8.3 10*3/uL (ref 3.9–10.3)

## 2017-10-11 LAB — IRON AND TIBC
Iron: 95 ug/dL (ref 41–142)
Saturation Ratios: 27 % (ref 21–57)
TIBC: 354 ug/dL (ref 236–444)
UIBC: 259 ug/dL

## 2017-10-11 LAB — RETICULOCYTES
RBC.: 4.83 MIL/uL (ref 3.70–5.45)
Retic Count, Absolute: 91.8 10*3/uL — ABNORMAL HIGH (ref 33.7–90.7)
Retic Ct Pct: 1.9 % (ref 0.7–2.1)

## 2017-10-11 LAB — FERRITIN: Ferritin: 47 ng/mL (ref 9–269)

## 2017-10-11 LAB — VITAMIN B12: Vitamin B-12: 135 pg/mL — ABNORMAL LOW (ref 180–914)

## 2017-10-12 ENCOUNTER — Telehealth: Payer: Self-pay

## 2017-10-12 LAB — VITAMIN D 25 HYDROXY (VIT D DEFICIENCY, FRACTURES): Vit D, 25-Hydroxy: 10.1 ng/mL — ABNORMAL LOW (ref 30.0–100.0)

## 2017-10-12 NOTE — Telephone Encounter (Signed)
Pt called requesting test results. No need for blood or fluids at this time. Labs reviewed by Dr. Irene Limbo and low results of Vit D 10.1 from 6.6 and Vit B12 135 from 615. Left VM with pt to relay lab results and to confirm pt is taking replacement: 50,000 U SL twice weekly cyanobalamin and 2,060mcg SL daily ergocalciferol. Requested for pt to call back if she needs refills on these medications.

## 2017-10-12 NOTE — Telephone Encounter (Signed)
Spoke with patient concerning the scheduling of her up coming appointment in 6 months. She asked if someone will call with the test results from Monday blood work. (called RN) and gave her this message. Per 5/13 los

## 2017-10-12 NOTE — Telephone Encounter (Signed)
Pt called stating that she missed the call from the office, and still wanted to speak with Dr. Irene Limbo. Left another VM and let pt know to call back regarding IV iron and B12 injection recommended by Dr. Irene Limbo in addition to Vit D and Vit B12 replacement. Pt to expect call back in the morning if VM left after 4pm.

## 2017-10-15 ENCOUNTER — Other Ambulatory Visit: Payer: Self-pay | Admitting: Hematology

## 2017-10-26 DIAGNOSIS — F331 Major depressive disorder, recurrent, moderate: Secondary | ICD-10-CM | POA: Diagnosis not present

## 2017-11-02 DIAGNOSIS — F331 Major depressive disorder, recurrent, moderate: Secondary | ICD-10-CM | POA: Diagnosis not present

## 2017-11-18 DIAGNOSIS — H524 Presbyopia: Secondary | ICD-10-CM | POA: Diagnosis not present

## 2017-12-27 DIAGNOSIS — F331 Major depressive disorder, recurrent, moderate: Secondary | ICD-10-CM | POA: Diagnosis not present

## 2018-01-03 DIAGNOSIS — F331 Major depressive disorder, recurrent, moderate: Secondary | ICD-10-CM | POA: Diagnosis not present

## 2018-02-11 DIAGNOSIS — Z23 Encounter for immunization: Secondary | ICD-10-CM | POA: Diagnosis not present

## 2018-03-07 DIAGNOSIS — D122 Benign neoplasm of ascending colon: Secondary | ICD-10-CM | POA: Diagnosis not present

## 2018-03-07 DIAGNOSIS — K635 Polyp of colon: Secondary | ICD-10-CM | POA: Diagnosis not present

## 2018-03-07 DIAGNOSIS — K640 First degree hemorrhoids: Secondary | ICD-10-CM | POA: Diagnosis not present

## 2018-03-07 DIAGNOSIS — Z86010 Personal history of colonic polyps: Secondary | ICD-10-CM | POA: Diagnosis not present

## 2018-03-07 DIAGNOSIS — K5730 Diverticulosis of large intestine without perforation or abscess without bleeding: Secondary | ICD-10-CM | POA: Diagnosis not present

## 2018-03-28 DIAGNOSIS — L570 Actinic keratosis: Secondary | ICD-10-CM | POA: Diagnosis not present

## 2018-03-28 DIAGNOSIS — D692 Other nonthrombocytopenic purpura: Secondary | ICD-10-CM | POA: Diagnosis not present

## 2018-03-28 DIAGNOSIS — D225 Melanocytic nevi of trunk: Secondary | ICD-10-CM | POA: Diagnosis not present

## 2018-03-28 DIAGNOSIS — L578 Other skin changes due to chronic exposure to nonionizing radiation: Secondary | ICD-10-CM | POA: Diagnosis not present

## 2018-04-04 DIAGNOSIS — E039 Hypothyroidism, unspecified: Secondary | ICD-10-CM | POA: Diagnosis not present

## 2018-04-04 DIAGNOSIS — E119 Type 2 diabetes mellitus without complications: Secondary | ICD-10-CM | POA: Diagnosis not present

## 2018-04-04 DIAGNOSIS — D509 Iron deficiency anemia, unspecified: Secondary | ICD-10-CM | POA: Diagnosis not present

## 2018-04-04 DIAGNOSIS — E782 Mixed hyperlipidemia: Secondary | ICD-10-CM | POA: Diagnosis not present

## 2018-04-07 ENCOUNTER — Telehealth: Payer: Self-pay | Admitting: Hematology

## 2018-04-07 NOTE — Telephone Encounter (Signed)
GK out 11/11 - per [place] move appointments three weeks out. Spoke with patient re lab/fu 12/9.

## 2018-04-11 ENCOUNTER — Ambulatory Visit: Payer: 59 | Admitting: Hematology

## 2018-04-11 ENCOUNTER — Other Ambulatory Visit: Payer: 59

## 2018-04-11 DIAGNOSIS — F331 Major depressive disorder, recurrent, moderate: Secondary | ICD-10-CM | POA: Diagnosis not present

## 2018-04-12 ENCOUNTER — Other Ambulatory Visit: Payer: 59

## 2018-04-12 ENCOUNTER — Ambulatory Visit: Payer: 59 | Admitting: Hematology

## 2018-05-09 ENCOUNTER — Inpatient Hospital Stay: Payer: 59 | Attending: Hematology

## 2018-05-09 ENCOUNTER — Telehealth: Payer: Self-pay

## 2018-05-09 ENCOUNTER — Inpatient Hospital Stay (HOSPITAL_BASED_OUTPATIENT_CLINIC_OR_DEPARTMENT_OTHER): Payer: 59 | Admitting: Hematology

## 2018-05-09 VITALS — BP 158/90 | HR 90 | Temp 98.4°F | Resp 18 | Ht 64.0 in | Wt 177.7 lb

## 2018-05-09 DIAGNOSIS — Z853 Personal history of malignant neoplasm of breast: Secondary | ICD-10-CM

## 2018-05-09 DIAGNOSIS — I10 Essential (primary) hypertension: Secondary | ICD-10-CM

## 2018-05-09 DIAGNOSIS — D509 Iron deficiency anemia, unspecified: Secondary | ICD-10-CM | POA: Insufficient documentation

## 2018-05-09 DIAGNOSIS — Z9884 Bariatric surgery status: Secondary | ICD-10-CM

## 2018-05-09 DIAGNOSIS — Z90722 Acquired absence of ovaries, bilateral: Secondary | ICD-10-CM | POA: Diagnosis not present

## 2018-05-09 DIAGNOSIS — Z79899 Other long term (current) drug therapy: Secondary | ICD-10-CM

## 2018-05-09 DIAGNOSIS — Z923 Personal history of irradiation: Secondary | ICD-10-CM

## 2018-05-09 DIAGNOSIS — F17210 Nicotine dependence, cigarettes, uncomplicated: Secondary | ICD-10-CM

## 2018-05-09 DIAGNOSIS — Z90710 Acquired absence of both cervix and uterus: Secondary | ICD-10-CM | POA: Diagnosis not present

## 2018-05-09 DIAGNOSIS — Z9079 Acquired absence of other genital organ(s): Secondary | ICD-10-CM | POA: Insufficient documentation

## 2018-05-09 DIAGNOSIS — D500 Iron deficiency anemia secondary to blood loss (chronic): Secondary | ICD-10-CM

## 2018-05-09 DIAGNOSIS — E538 Deficiency of other specified B group vitamins: Secondary | ICD-10-CM

## 2018-05-09 LAB — CBC WITH DIFFERENTIAL/PLATELET
Abs Immature Granulocytes: 0.02 10*3/uL (ref 0.00–0.07)
Basophils Absolute: 0.1 10*3/uL (ref 0.0–0.1)
Basophils Relative: 1 %
Eosinophils Absolute: 0.3 10*3/uL (ref 0.0–0.5)
Eosinophils Relative: 3 %
HCT: 47 % — ABNORMAL HIGH (ref 36.0–46.0)
Hemoglobin: 14.6 g/dL (ref 12.0–15.0)
Immature Granulocytes: 0 %
Lymphocytes Relative: 18 %
Lymphs Abs: 1.7 10*3/uL (ref 0.7–4.0)
MCH: 28.2 pg (ref 26.0–34.0)
MCHC: 31.1 g/dL (ref 30.0–36.0)
MCV: 90.7 fL (ref 80.0–100.0)
Monocytes Absolute: 0.4 10*3/uL (ref 0.1–1.0)
Monocytes Relative: 4 %
Neutro Abs: 6.9 10*3/uL (ref 1.7–7.7)
Neutrophils Relative %: 74 %
Platelets: 302 10*3/uL (ref 150–400)
RBC: 5.18 MIL/uL — ABNORMAL HIGH (ref 3.87–5.11)
RDW: 13.4 % (ref 11.5–15.5)
WBC: 9.3 10*3/uL (ref 4.0–10.5)
nRBC: 0 % (ref 0.0–0.2)

## 2018-05-09 LAB — CMP (CANCER CENTER ONLY)
ALT: 10 U/L (ref 0–44)
AST: 13 U/L — ABNORMAL LOW (ref 15–41)
Albumin: 4.2 g/dL (ref 3.5–5.0)
Alkaline Phosphatase: 118 U/L (ref 38–126)
Anion gap: 10 (ref 5–15)
BUN: 10 mg/dL (ref 6–20)
CO2: 21 mmol/L — ABNORMAL LOW (ref 22–32)
Calcium: 10.1 mg/dL (ref 8.9–10.3)
Chloride: 108 mmol/L (ref 98–111)
Creatinine: 1.05 mg/dL — ABNORMAL HIGH (ref 0.44–1.00)
GFR, Est AFR Am: >60 mL/min (ref >60)
GFR, Estimated: 58 mL/min — ABNORMAL LOW (ref >60)
Glucose, Bld: 110 mg/dL — ABNORMAL HIGH (ref 70–99)
Potassium: 4.6 mmol/L (ref 3.5–5.1)
Sodium: 139 mmol/L (ref 135–145)
Total Bilirubin: 0.5 mg/dL (ref 0.3–1.2)
Total Protein: 7.1 g/dL (ref 6.5–8.1)

## 2018-05-09 LAB — RETICULOCYTES
Immature Retic Fract: 7.8 % (ref 2.3–15.9)
RBC.: 5.18 MIL/uL — ABNORMAL HIGH (ref 3.87–5.11)
Retic Count, Absolute: 71.5 10*3/uL (ref 19.0–186.0)
Retic Ct Pct: 1.4 % (ref 0.4–3.1)

## 2018-05-09 LAB — IRON AND TIBC
Iron: 73 ug/dL (ref 41–142)
Saturation Ratios: 19 % — ABNORMAL LOW (ref 21–57)
TIBC: 379 ug/dL (ref 236–444)
UIBC: 306 ug/dL (ref 120–384)

## 2018-05-09 LAB — VITAMIN B12: Vitamin B-12: 133 pg/mL — ABNORMAL LOW (ref 180–914)

## 2018-05-09 LAB — FERRITIN: Ferritin: 31 ng/mL (ref 11–307)

## 2018-05-09 NOTE — Progress Notes (Signed)
[facility]    HEMATOLOGY/ONCOLOGY CLINIC NOTE  Date of Service: .05/09/2018    Patient Care Team: Maurice Small, MD as PCP - General (Family Medicine)  CHIEF COMPLAINTS/PURPOSE OF CONSULTATION:  Iron deficiency Anemia  HISTORY OF PRESENTING ILLNESS:  Theresa Clay is a wonderful 62 y.o. female who has been referred to [place] by Dr Maurice Small, MD  for evaluation and management of Iron deficiency Anemia to consider IV Iron infusion.  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  Patient has recently noted significantly increased fatigue and exhaustion. She notes that she has developed food craving for boiled potatoes. Labs done with her primary care physician recently on 09/22/2016 showed a hemoglobin of 9.2 with a ferritin of 3.2 she notes no overt GI bleeding. Likely has absorption issues related to her Roux-en-Y gastric bypass surgery.   She was previously on by mouth iron replacement for the last 3 years but stopped it due to GI intolerance. Has been on daily multivitamin but no specific B12 or other B vitamin replacements at this time . Patient notes that she has had B12 deficiency in the past and was on B12 shots until 4 years ago. Has been on calcium and vitamin D.   Patient notes no overt GI bleeding no hematemesis no melena no hematochezia no hematuria no epistaxis or gum bleeding. Patient notes she last had a colonoscopy about 2 years ago and whether to have a benign polyp but no other issues with GI bleeding. Had an EGD in 2014 which was unrevealing.  Patient notes that she doesn't think she can tolerate oral iron replacement and really wants her iron replaced since she feels "lousy".  No acute weight loss. No other new focal symptoms.  INTERVAL HISTORY  Patient is here for f/u if her Iron deficiency  Anemia. The patient's last visit with [place] was on 10/11/17. The pt reports that she is doing well overall.   The pt reports that she has had to take a nap every day, but notes that she feels refreshed after taking her naps. 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 She notes that aside from this, she has had good energy levels.   The pt notes that she has not taken the SL [place] B12 much in the interim as she has frequently forgotten this. She used to self-administer B12 injections.   She notes that she has had significant abdominal discomfort and nausea after she quit taking Omeprazole.   Lab results today (05/09/18) of CBC w/diff, Reticulocytes, and CMP is as follows: all values are WNL except for RBC at 5.18, HCT at 47.0, CO2 at 21, Glucose at 110, Creatinine at 1.05, AST at 13, GFR at 58. 05/09/18 Iron and TIBC revealed a 19% Saturation Ratio 05/09/18 Ferritin at 31 05/09/18 Vitamin B12 is pending   On review of systems, pt reports good energy levels overall, abdominal discomfort, emotional stress, and denies leg swelling, blood in the stools, and any other symptoms.   MEDICAL HISTORY:  Past Medical History:  Diagnosis Date  . Anxiety   . Bipolar 1 disorder ([place])   . Breast cancer ([place]) 12/13/07   left , ER/PR +  . Depression   . Esophageal reflux   . Fibromyalgia   . GERD (gastroesophageal reflux disease)   .  Hx of radiation therapy 04/04/08-05/17/08   left breast, tumor bed  . Hyperlipemia   . 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 Hypothyroidism   . Osteoarthritis   . Personal history of radiation therapy 2009  History of PCOS status post hysterectomy with bilateral salpingo-oophorectomy 07/1996.     SURGICAL HISTORY: Past Surgical History:  Procedure Laterality Date  . BREAST BIOPSY Left 01/09/2008  . BREAST LUMPECTOMY Left 01/2008  . CHOLECYSTECTOMY    . GASTRIC BYPASS    . KNEE ARTHROSCOPY Left  08/09/2012   Procedure: ARTHROSCOPY LEFT KNEE chondroplasty;  Surgeon: Hessie Dibble, MD;  Location: [facility];  Service: Orthopedics;  Laterality: Left;  . KNEE SURGERY  10/2011   reconstruction  . TONSILLECTOMY    . TOTAL ABDOMINAL HYSTERECTOMY W/ BILATERAL SALPINGOOPHORECTOMY      SOCIAL HISTORY: Social History   Socioeconomic History  . Marital status: Married    Spouse name: Not on file  . Number of children: 1  . Years of education: college  . Highest education level: Not on file  Occupational History  . Occupation: disabled    Comment: for [employer]  Social Needs  . Financial resource strain: Not on file  . Food insecurity:    Worry: Not on file    Inability: Not on file  . Transportation needs:    Medical: Not on file    Non-medical: Not on file  Tobacco Use  . Smoking status: Current Every Day Smoker    Packs/day: 0.50    Years: 5.00    Pack years: 2.50  . Smokeless tobacco: Never Used  . Tobacco comment: 5-6 cigarettes daily  Substance and Sexual Activity  . Alcohol use: No  . Drug use: No  . Sexual activity: Yes    Birth control/protection: Surgical  Lifestyle  . Physical activity:    Days per week: Not on file    Minutes per session: Not on file  . Stress: Not on file  Relationships  . Social connections:    Talks on phone: Not on file    Gets together: Not on file    Attends religious service: Not on file    Active member of club or organization: Not on file    Attends meetings of clubs or organizations: Not on file    Relationship status: Not on file  . Intimate partner violence:    Fear of current or ex partner: Not on file    Emotionally abused: Not on file    Physically abused: Not on file    Forced sexual activity: Not on file  Other Topics Concern  . Not on file  Social History Narrative  . Not on file    FAMILY HISTORY: Family History  Problem Relation Age of Onset  . Breast cancer Mother 32  . Melanoma Mother   .  Dementia Father   . Cancer - Prostate Maternal Grandfather     ALLERGIES:  is allergic to darvocet [propoxyphene n-acetaminophen]; penicillins; codeine; hydrocodone; oxycodone; and simvastatin.  MEDICATIONS:  Current Outpatient Medications  Medication Sig Dispense Refill  . amphetamine-dextroamphetamine (ADDERALL XR) 20 MG 24 hr capsule Take 20 mg by mouth 2 (two) times daily.    Marland Kitchen b complex vitamins capsule Take 1 capsule by mouth daily. 30 capsule 2  . Cyanocobalamin (B-12) 1000 MCG SUBL Place 2,000 mcg under the tongue daily. 60 each 3  . levothyroxine (SYNTHROID, LEVOTHROID) 100 MCG tablet Take 100 mcg by mouth at bedtime.    [facility]  LIDODERM 5 % Place 1-3 patches onto the skin every 12 (twelve) hours as needed (pain).     [facility] Kitchen lithium carbonate 300 MG capsule Take 300-600 mg by mouth 2 (two) times daily. One tablet in am two tablets pm    . LORazepam (ATIVAN) 1 MG tablet Take 0.5-1 mg by mouth 2 (two) times daily. Take 1mg  at night and 0.5mg  in the morning    . omeprazole (PRILOSEC) 40 MG capsule 40 mg daily.     [facility]land Kitchen tiZANidine (ZANAFLEX) 4 MG capsule Take 4 mg by mouth daily. Take daily    . valACYclovir (VALTREX) 500 MG tablet Take 500 mg by mouth daily. One tablet every 12 hours as needed    . Vitamin D, Ergocalciferol, (DRISDOL) 50000 units CAPS capsule TAKE 1 CAPSULE BY MOUTH 2 TIMES A WEEK. 24 capsule 0   No current facility-administered medications for this visit.     REVIEW OF SYSTEMS:    A 10+ POINT REVIEW OF SYSTEMS WAS OBTAINED including neurology, dermatology, psychiatry, cardiac, respiratory, lymph, extremities, GI, GU, Musculoskeletal, constitutional, breasts, reproductive, HEENT.  All pertinent positives are noted in the HPI.  All others are negative.   PHYSICAL EXAMINATION: ECOG PERFORMANCE STATUS: 2 - Symptomatic, <50% confined to bed  . Vitals:   05/09/18 1030  BP: (!) 158/90  Pulse: 90  Resp: 18  Temp: 98.4 F (36.9 C)  SpO2: 98%   Filed Weights   05/09/18 1030    Weight: 177 lb 11.2 oz (80.6 kg)   .Body mass index is 30.5 kg/m.  GENERAL:alert, in no acute distress and comfortable SKIN: no acute rashes, no significant lesions EYES: conjunctiva are pink and non-injected, sclera anicteric OROPHARYNX: MMM, no exudates, no oropharyngeal erythema or ulceration NECK: supple, no JVD LYMPH:  no palpable lymphadenopathy in the cervical, axillary or inguinal regions LUNGS: clear to auscultation b/l with normal respiratory effort HEART: regular rate & rhythm ABDOMEN:  normoactive bowel sounds , non tender, not distended. No palpable hepatosplenomegaly.  Extremity: no pedal edema PSYCH: alert & oriented x 3 with fluent speech NEURO: no focal motor/sensory deficits   LABORATORY DATA:  I have reviewed the data as listed  . CBC Latest Ref Rng & Units 05/09/2018 10/11/2017 12/29/2016  WBC 4.0 - 10.5 K/uL 9.3 8.3 8.1  Hemoglobin 12.0 - 15.0 g/dL 14.6 14.1 15.0  Hematocrit 36.0 - 46.0 % 47.0(H) 44.1 48.0(H)  Platelets 150 - 400 K/uL 302 314 290   . CBC    Component Value Date/Time   WBC 9.3 05/09/2018 0926   RBC 5.18 (H) 05/09/2018 0926   RBC 5.18 (H) 05/09/2018 0926   HGB 14.6 05/09/2018 0926   HGB 14.1 10/11/2017 1322   HGB 15.0 12/29/2016 0920   HCT 47.0 (H) 05/09/2018 0926   HCT 48.0 (H) 12/29/2016 0920   PLT 302 05/09/2018 0926   PLT 314 10/11/2017 1322   PLT 290 12/29/2016 0920   MCV 90.7 05/09/2018 0926   MCV 89.9 12/29/2016 0920   MCH 28.2 05/09/2018 0926   MCHC 31.1 05/09/2018 0926   RDW 13.4 05/09/2018 0926   RDW 17.5 (H) 12/29/2016 0920   LYMPHSABS 1.7 05/09/2018 0926   LYMPHSABS 1.9 12/29/2016 0920   MONOABS 0.4 05/09/2018 0926   MONOABS 0.4 12/29/2016 0920   EOSABS 0.3 05/09/2018 0926   EOSABS 0.3 12/29/2016 0920   BASOSABS 0.1 05/09/2018 0926   BASOSABS 0.0 12/29/2016 0920     . CMP Latest Ref Rng & Units 05/09/2018 10/11/2017 12/29/2016  Glucose 70 - 99 mg/dL 110(H) 95 98  BUN 6 - 20 mg/dL 10 12 9.6  Creatinine 0.44 -  1.00 mg/dL 1.05(H) 1.00 1.0  Sodium 135 - 145 mmol/L 139 137 139  Potassium 3.5 - 5.1 mmol/L 4.6 4.2 3.9  Chloride 98 - 111 mmol/L 108 107 -  CO2 22 - 32 mmol/L 21(L) 25 21(L)  Calcium 8.9 - 10.3 mg/dL 10.1 10.0 10.6(H)  Total Protein 6.5 - 8.1 g/dL 7.1 7.1 7.3  Total Bilirubin 0.3 - 1.2 mg/dL 0.5 0.4 0.25  Alkaline Phos 38 - 126 U/L 118 124 129  AST 15 - 41 U/L 13(L) 20 70(H)  ALT 0 - 44 U/L 10 18 69(H)   . Lab Results  Component Value Date   IRON 73 05/09/2018   TIBC 379 05/09/2018   IRONPCTSAT 19 (L) 05/09/2018   (Iron and TIBC)  Lab Results  Component Value Date   FERRITIN 31 05/09/2018       B12 ---133  RADIOGRAPHIC STUDIES: I have personally reviewed the radiological images as listed and agreed with the findings in the report. No results found.  ASSESSMENT & PLAN:   62 y.o. female with  #1 Severe iron deficiency anemia Likely due to poor iron absorption in the setting of chronic PPI therapy and history of Roux-en-Y gastric bypass surgery. No overt issues with GI bleeding. Absorption seems the primary issue since the patient also has coexistent B12 deficiency and vitamin D deficiency.  PLAN:  -Discussed pt labwork today, 05/09/18; Hgb normal at 14.6, Ferritin at 31, 19% Saturation ratio, chemistries are stable.  -Goal for Ferritin >100 -Will order IV Injectafer x weekly for 2 doses, pt prefers to do this in early January 2020, which we will do -Will likely replace IV Injectafer about every 6 months -Okay to use Omeprazole for symptomatic relief, as we are replacing Iron IV -Recommend PCP consider GI referral given Celebrex use and abdominal discomfort, especially if these symptoms persist after returning to Omeprazole  #2 vitamin B12 deficiency. Likely related to poor absorption due to chronic PPI therapy and Roux-en-Y gastric bypass surgery. Antiparietal cell and anti-intrinsic factor antibodies negative - suggest against pernicious anemia. No clinical  evidence of gluten intolerance. B12 levels have improved from 218 to 615 Now down to 133 due to non compliance with SL B12 PLAN: -05/09/18 Vitamin B12 level are still low at 133, if under-replaced will switch from SL Vitamin B12 to [place], which the pt prefers  -prescribed  B12 1000 mcg weekly x 4 doses then monthly long term  #3 severe vitamin D deficiency  Likely due to poor absorption due to chronic PPI therapy and Roux-en-Y gastric bypass surgery. Plan -mx per PCP  #4 . Patient Active Problem List   Diagnosis Date Noted  . Iron deficiency anemia 10/08/2016  . Diverticula of small intestine 03/29/2014  . Diverticulitis of duodenum 03/29/2014  . 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 Breast cancer ([place])   . Hx of radiation therapy   . Bipolar 1 disorder ([place])   . Fibromyalgia   . Osteoarthritis    -continue f/u with PCP for mx of other medical co-morbidities.   IV Injectafer weekly x 2 doses starting in 1 month(in Jan 2020)- per patients preference RTC with Dr Irene Limbo in 7 months with labs    All of the patients questions were answered with apparent satisfaction. The patient knows to call the clinic with any problems, questions or concerns.  The total time spent  in the appt was 25 minutes and more than 50% was on counseling and direct patient cares.    Sullivan Lone MD [place] AAHIVMS [facility] Hematology/Oncology Physician [facility]  (Office):       [phone] (Work cell):  [phone] (Fax):           [phone]  I, Baldwin [place], am acting as a scribe for Dr. Sullivan Lone.   .I have reviewed the above documentation for accuracy and completeness, and I agree with the above. Brunetta Genera MD

## 2018-05-09 NOTE — Telephone Encounter (Signed)
Printed avs and calender of upcoming appointment. Per 1/29 los 

## 2018-05-12 DIAGNOSIS — H01009 Unspecified blepharitis unspecified eye, unspecified eyelid: Secondary | ICD-10-CM | POA: Diagnosis not present

## 2018-05-12 DIAGNOSIS — H04123 Dry eye syndrome of bilateral lacrimal glands: Secondary | ICD-10-CM | POA: Diagnosis not present

## 2018-05-12 DIAGNOSIS — H16423 Pannus (corneal), bilateral: Secondary | ICD-10-CM | POA: Diagnosis not present

## 2018-05-15 MED ORDER — CYANOCOBALAMIN 1000 MCG/ML IJ SOLN: INTRAMUSCULAR | 2 refills | Status: DC

## 2018-05-16 DIAGNOSIS — M545 Low back pain: Secondary | ICD-10-CM | POA: Diagnosis not present

## 2018-05-18 ENCOUNTER — Telehealth: Payer: Self-pay | Admitting: *Deleted

## 2018-05-18 NOTE — Telephone Encounter (Signed)
Contacted patient to ask if any questions or concerns related to [place] B12 injection/prescription. Patient stated she had already picked it up and started medication. Encouraged to call for questions or concerns. Patient verbalized understanding.

## 2018-06-06 DIAGNOSIS — F331 Major depressive disorder, recurrent, moderate: Secondary | ICD-10-CM | POA: Diagnosis not present

## 2018-06-06 DIAGNOSIS — M47816 Spondylosis without myelopathy or radiculopathy, lumbar region: Secondary | ICD-10-CM | POA: Diagnosis not present

## 2018-06-13 DIAGNOSIS — F331 Major depressive disorder, recurrent, moderate: Secondary | ICD-10-CM | POA: Diagnosis not present

## 2018-06-20 ENCOUNTER — Ambulatory Visit (HOSPITAL_COMMUNITY)
Admission: RE | Admit: 2018-06-20 | Discharge: 2018-06-20 | Disposition: A | Discharge status: Home / Self Care | Payer: 59 | Source: Ambulatory Visit | Attending: Hematology | Admitting: Hematology

## 2018-06-20 DIAGNOSIS — D509 Iron deficiency anemia, unspecified: Secondary | ICD-10-CM | POA: Diagnosis not present

## 2018-06-20 DIAGNOSIS — H26493 Other secondary cataract, bilateral: Secondary | ICD-10-CM | POA: Diagnosis not present

## 2018-06-20 DIAGNOSIS — Q141 Congenital malformation of retina: Secondary | ICD-10-CM | POA: Diagnosis not present

## 2018-06-20 DIAGNOSIS — Z961 Presence of intraocular lens: Secondary | ICD-10-CM | POA: Diagnosis not present

## 2018-06-20 DIAGNOSIS — H26491 Other secondary cataract, right eye: Secondary | ICD-10-CM | POA: Diagnosis not present

## 2018-06-20 DIAGNOSIS — D649 Anemia, unspecified: Secondary | ICD-10-CM | POA: Diagnosis not present

## 2018-06-20 MED ORDER — METHYLPREDNISOLONE SODIUM SUCC 125 MG IJ SOLR: 60.0000 mg | Freq: Once | INTRAMUSCULAR | Status: DC

## 2018-06-20 MED ORDER — SODIUM CHLORIDE 0.9 % IV SOLN
750.0000 mg | Freq: Once | INTRAVENOUS | Status: AC
  Administered 2018-06-20: 750 mg via INTRAVENOUS
  Filled 2018-06-20: qty 15

## 2018-06-20 MED ORDER — FAMOTIDINE 20 MG PO TABS
20.0000 mg | ORAL_TABLET | Freq: Once | ORAL | Status: AC
  Administered 2018-06-20: 20 mg via ORAL
  Filled 2018-06-20: qty 1

## 2018-06-20 MED ORDER — SODIUM CHLORIDE 0.9 % IV SOLN
INTRAVENOUS | Status: DC | PRN
  Administered 2018-06-20: 250 mL via INTRAVENOUS

## 2018-06-20 MED ORDER — DIPHENHYDRAMINE HCL 25 MG PO CAPS
25.0000 mg | ORAL_CAPSULE | Freq: Once | ORAL | Status: AC
  Administered 2018-06-20: 25 mg via ORAL
  Filled 2018-06-20: qty 1

## 2018-06-20 NOTE — Progress Notes (Signed)
                   Patient Care Center Note   Diagnosis: Anemia     Provider: Carolyne Fiscal MD     Procedure: Ferric Carboxymaltose    Note: [name] came into the [facility] to receive IV iron  via PIV. Premedication administered prior to infusion; tolerated well, vitals WNL.  Discharge instructions reviewed and understanding verbalized. Patient alert, oriented and ambulatory at time of discharge. Discharge with family member.  Otho Bellows, RN

## 2018-06-20 NOTE — Discharge Instructions (Signed)

## 2018-06-27 ENCOUNTER — Ambulatory Visit (HOSPITAL_COMMUNITY)
Admission: RE | Admit: 2018-06-27 | Discharge: 2018-06-27 | Disposition: A | Discharge status: Home / Self Care | Payer: 59 | Source: Ambulatory Visit | Attending: Hematology | Admitting: Hematology

## 2018-06-27 DIAGNOSIS — D509 Iron deficiency anemia, unspecified: Secondary | ICD-10-CM | POA: Diagnosis not present

## 2018-06-27 MED ORDER — METHYLPREDNISOLONE SODIUM SUCC 125 MG IJ SOLR: 60.0000 mg | Freq: Once | INTRAMUSCULAR | Status: DC | PRN

## 2018-06-27 MED ORDER — SODIUM CHLORIDE 0.9 % IV SOLN
750.0000 mg | Freq: Once | INTRAVENOUS | Status: AC
  Administered 2018-06-27: 750 mg via INTRAVENOUS
  Filled 2018-06-27: qty 15

## 2018-06-27 MED ORDER — FAMOTIDINE 20 MG PO TABS
20.0000 mg | ORAL_TABLET | Freq: Once | ORAL | Status: AC
  Administered 2018-06-27: 20 mg via ORAL
  Filled 2018-06-27: qty 1

## 2018-06-27 MED ORDER — DIPHENHYDRAMINE HCL 25 MG PO CAPS
25.0000 mg | ORAL_CAPSULE | Freq: Once | ORAL | Status: AC
  Administered 2018-06-27: 25 mg via ORAL
  Filled 2018-06-27: qty 1

## 2018-06-27 MED ORDER — SODIUM CHLORIDE 0.9 % IV SOLN
INTRAVENOUS | Status: DC | PRN
  Administered 2018-06-27: 250 mL via INTRAVENOUS

## 2018-06-27 NOTE — Progress Notes (Signed)
PATIENT CARE CENTER NOTE  Diagnosis: Anemia    Provider: Dr. Irene Limbo   Procedure: IV Injectafer    Note: Patient received Injectafer infusion. Pre-medications given per order. Observed patient for 30 minutes post-infusion. Tolerated well. Vital signs stable. Discharge instructions given. Patient alert, oriented and ambulatory at discharge.

## 2018-06-27 NOTE — Discharge Instructions (Signed)

## 2018-07-04 DIAGNOSIS — Z79899 Other long term (current) drug therapy: Secondary | ICD-10-CM | POA: Diagnosis not present

## 2018-07-04 DIAGNOSIS — Z9181 History of falling: Secondary | ICD-10-CM | POA: Diagnosis not present

## 2018-07-04 DIAGNOSIS — M4306 Spondylolysis, lumbar region: Secondary | ICD-10-CM | POA: Diagnosis not present

## 2018-07-06 DIAGNOSIS — M47816 Spondylosis without myelopathy or radiculopathy, lumbar region: Secondary | ICD-10-CM | POA: Diagnosis not present

## 2018-08-01 DIAGNOSIS — H26493 Other secondary cataract, bilateral: Secondary | ICD-10-CM | POA: Diagnosis not present

## 2018-08-01 DIAGNOSIS — M47816 Spondylosis without myelopathy or radiculopathy, lumbar region: Secondary | ICD-10-CM | POA: Diagnosis not present

## 2018-08-16 DIAGNOSIS — M47816 Spondylosis without myelopathy or radiculopathy, lumbar region: Secondary | ICD-10-CM | POA: Diagnosis not present

## 2018-09-19 DIAGNOSIS — M47816 Spondylosis without myelopathy or radiculopathy, lumbar region: Secondary | ICD-10-CM | POA: Diagnosis not present

## 2018-10-06 DIAGNOSIS — F331 Major depressive disorder, recurrent, moderate: Secondary | ICD-10-CM | POA: Diagnosis not present

## 2018-10-10 DIAGNOSIS — F331 Major depressive disorder, recurrent, moderate: Secondary | ICD-10-CM | POA: Diagnosis not present

## 2018-10-12 DIAGNOSIS — M47816 Spondylosis without myelopathy or radiculopathy, lumbar region: Secondary | ICD-10-CM | POA: Diagnosis not present

## 2018-12-12 ENCOUNTER — Inpatient Hospital Stay: Payer: 59 | Admitting: Hematology

## 2018-12-12 ENCOUNTER — Inpatient Hospital Stay: Payer: 59

## 2018-12-12 ENCOUNTER — Other Ambulatory Visit: Payer: Self-pay | Admitting: Family Medicine

## 2018-12-12 DIAGNOSIS — Z1231 Encounter for screening mammogram for malignant neoplasm of breast: Secondary | ICD-10-CM

## 2018-12-13 ENCOUNTER — Telehealth: Payer: Self-pay | Admitting: *Deleted

## 2018-12-13 NOTE — Telephone Encounter (Signed)
Contacted patient. She had phone problems and lost all her appts. She is currently in [facility], plans to return by end of month. Advised her schedule message will be sent and they will contact to reschedule for 7 month f/u with labs.. She is in agreement

## 2018-12-14 ENCOUNTER — Telehealth: Payer: Self-pay | Admitting: Hematology

## 2018-12-14 NOTE — Telephone Encounter (Signed)
Scheduled appt per 7/14 sch msg - pt awre of appt date and time

## 2019-01-12 ENCOUNTER — Other Ambulatory Visit: Payer: 59

## 2019-01-12 ENCOUNTER — Ambulatory Visit: Payer: 59 | Admitting: Hematology

## 2019-02-13 ENCOUNTER — Ambulatory Visit: Payer: 59

## 2019-02-23 ENCOUNTER — Telehealth: Payer: Self-pay | Admitting: *Deleted

## 2019-02-23 NOTE — Telephone Encounter (Signed)
Patient called and said she was over due for appt with Dr.Kale. She states she has missed several appts. States she is experiencing bruising and bleeds "like crazy" when she is cut. Informed her that a message will be sent to scheduling and they will contact her to arrange appt. She verbalized understanding. Advised her that if bleeding becomes excessive, she should seek immediate medical care. She verbalized understanding.

## 2019-02-24 ENCOUNTER — Telehealth: Payer: Self-pay | Admitting: Hematology

## 2019-02-24 NOTE — Telephone Encounter (Signed)
Returned call re scheduling appointment. Not able to reach patient. Left message asking patient to call back at her earliest convenience to let us know when she can come in.

## 2019-02-27 ENCOUNTER — Telehealth: Payer: Self-pay | Admitting: Hematology

## 2019-02-27 NOTE — Telephone Encounter (Signed)
Returned patient's phone call regarding rescheduling cancelled August appointment, per patient's request appointment has been rescheduled to 10/08.

## 2019-03-01 ENCOUNTER — Telehealth: Payer: Self-pay | Admitting: Hematology

## 2019-03-01 NOTE — Telephone Encounter (Signed)
Returned patient's phone call regarding rescheduling an appointment, left a voicemail. 

## 2019-03-06 ENCOUNTER — Telehealth: Payer: Self-pay | Admitting: Hematology

## 2019-03-06 NOTE — Telephone Encounter (Signed)
Returned patient's phone call regarding rescheduling an appointment, per patient's request 10/08 has moved to 10/13.

## 2019-03-09 ENCOUNTER — Ambulatory Visit: Payer: 59 | Admitting: Hematology

## 2019-03-09 ENCOUNTER — Other Ambulatory Visit: Payer: 59

## 2019-03-13 ENCOUNTER — Telehealth: Payer: Self-pay | Admitting: Hematology

## 2019-03-13 NOTE — Telephone Encounter (Signed)
Returned patient's phone call regarding rescheduling an appointment, left a voicemail. 

## 2019-03-14 ENCOUNTER — Inpatient Hospital Stay: Payer: 59 | Attending: Hematology

## 2019-03-14 ENCOUNTER — Inpatient Hospital Stay: Payer: 59 | Admitting: Hematology

## 2019-03-14 DIAGNOSIS — Z853 Personal history of malignant neoplasm of breast: Secondary | ICD-10-CM | POA: Insufficient documentation

## 2019-03-14 DIAGNOSIS — Z170 Estrogen receptor positive status [ER+]: Secondary | ICD-10-CM | POA: Insufficient documentation

## 2019-03-14 DIAGNOSIS — Z9079 Acquired absence of other genital organ(s): Secondary | ICD-10-CM | POA: Insufficient documentation

## 2019-03-14 DIAGNOSIS — Z79899 Other long term (current) drug therapy: Secondary | ICD-10-CM | POA: Insufficient documentation

## 2019-03-14 DIAGNOSIS — Z90722 Acquired absence of ovaries, bilateral: Secondary | ICD-10-CM | POA: Insufficient documentation

## 2019-03-14 DIAGNOSIS — Z90710 Acquired absence of both cervix and uterus: Secondary | ICD-10-CM | POA: Insufficient documentation

## 2019-03-14 DIAGNOSIS — E039 Hypothyroidism, unspecified: Secondary | ICD-10-CM | POA: Insufficient documentation

## 2019-03-14 DIAGNOSIS — E785 Hyperlipidemia, unspecified: Secondary | ICD-10-CM | POA: Insufficient documentation

## 2019-03-14 DIAGNOSIS — D509 Iron deficiency anemia, unspecified: Secondary | ICD-10-CM | POA: Insufficient documentation

## 2019-03-14 DIAGNOSIS — Z9884 Bariatric surgery status: Secondary | ICD-10-CM | POA: Insufficient documentation

## 2019-03-14 DIAGNOSIS — I10 Essential (primary) hypertension: Secondary | ICD-10-CM | POA: Insufficient documentation

## 2019-03-14 DIAGNOSIS — F17210 Nicotine dependence, cigarettes, uncomplicated: Secondary | ICD-10-CM | POA: Insufficient documentation

## 2019-03-14 DIAGNOSIS — Z923 Personal history of irradiation: Secondary | ICD-10-CM | POA: Insufficient documentation

## 2019-03-17 ENCOUNTER — Telehealth: Payer: Self-pay | Admitting: Hematology

## 2019-03-17 ENCOUNTER — Ambulatory Visit: Payer: 59

## 2019-03-17 NOTE — Telephone Encounter (Signed)
Confirmed with patient 10/26 lab and 10/27 telephone visit.

## 2019-03-27 ENCOUNTER — Inpatient Hospital Stay: Payer: 59

## 2019-03-27 ENCOUNTER — Other Ambulatory Visit: Payer: Self-pay

## 2019-03-27 DIAGNOSIS — Z9884 Bariatric surgery status: Secondary | ICD-10-CM

## 2019-03-27 DIAGNOSIS — E538 Deficiency of other specified B group vitamins: Secondary | ICD-10-CM

## 2019-03-27 DIAGNOSIS — Z170 Estrogen receptor positive status [ER+]: Secondary | ICD-10-CM | POA: Diagnosis not present

## 2019-03-27 DIAGNOSIS — E785 Hyperlipidemia, unspecified: Secondary | ICD-10-CM | POA: Diagnosis not present

## 2019-03-27 DIAGNOSIS — D509 Iron deficiency anemia, unspecified: Secondary | ICD-10-CM | POA: Diagnosis not present

## 2019-03-27 DIAGNOSIS — Z923 Personal history of irradiation: Secondary | ICD-10-CM | POA: Diagnosis not present

## 2019-03-27 DIAGNOSIS — D500 Iron deficiency anemia secondary to blood loss (chronic): Secondary | ICD-10-CM

## 2019-03-27 DIAGNOSIS — Z853 Personal history of malignant neoplasm of breast: Secondary | ICD-10-CM | POA: Diagnosis not present

## 2019-03-27 DIAGNOSIS — Z79899 Other long term (current) drug therapy: Secondary | ICD-10-CM | POA: Diagnosis not present

## 2019-03-27 DIAGNOSIS — E039 Hypothyroidism, unspecified: Secondary | ICD-10-CM | POA: Diagnosis not present

## 2019-03-27 DIAGNOSIS — F17210 Nicotine dependence, cigarettes, uncomplicated: Secondary | ICD-10-CM | POA: Diagnosis not present

## 2019-03-27 DIAGNOSIS — Z9079 Acquired absence of other genital organ(s): Secondary | ICD-10-CM | POA: Diagnosis not present

## 2019-03-27 DIAGNOSIS — I10 Essential (primary) hypertension: Secondary | ICD-10-CM | POA: Diagnosis not present

## 2019-03-27 DIAGNOSIS — Z90722 Acquired absence of ovaries, bilateral: Secondary | ICD-10-CM | POA: Diagnosis not present

## 2019-03-27 DIAGNOSIS — Z90710 Acquired absence of both cervix and uterus: Secondary | ICD-10-CM | POA: Diagnosis not present

## 2019-03-27 LAB — CMP (CANCER CENTER ONLY)
ALT: 12 U/L (ref 0–44)
AST: 14 U/L — ABNORMAL LOW (ref 15–41)
Albumin: 4.2 g/dL (ref 3.5–5.0)
Alkaline Phosphatase: 126 U/L (ref 38–126)
Anion gap: 11 (ref 5–15)
BUN: 11 mg/dL (ref 8–23)
CO2: 19 mmol/L — ABNORMAL LOW (ref 22–32)
Calcium: 9.9 mg/dL (ref 8.9–10.3)
Chloride: 112 mmol/L — ABNORMAL HIGH (ref 98–111)
Creatinine: 0.95 mg/dL (ref 0.44–1.00)
GFR, Est AFR Am: >60 mL/min (ref >60)
GFR, Estimated: >60 mL/min (ref >60)
Glucose, Bld: 95 mg/dL (ref 70–99)
Potassium: 3.4 mmol/L — ABNORMAL LOW (ref 3.5–5.1)
Sodium: 142 mmol/L (ref 135–145)
Total Bilirubin: 0.4 mg/dL (ref 0.3–1.2)
Total Protein: 6.9 g/dL (ref 6.5–8.1)

## 2019-03-27 LAB — CBC WITH DIFFERENTIAL/PLATELET
Abs Immature Granulocytes: 0.02 10*3/uL (ref 0.00–0.07)
Basophils Absolute: 0 10*3/uL (ref 0.0–0.1)
Basophils Relative: 0 %
Eosinophils Absolute: 0.1 10*3/uL (ref 0.0–0.5)
Eosinophils Relative: 1 %
HCT: 44.8 % (ref 36.0–46.0)
Hemoglobin: 14.7 g/dL (ref 12.0–15.0)
Immature Granulocytes: 0 %
Lymphocytes Relative: 26 %
Lymphs Abs: 1.8 10*3/uL (ref 0.7–4.0)
MCH: 29.6 pg (ref 26.0–34.0)
MCHC: 32.8 g/dL (ref 30.0–36.0)
MCV: 90.1 fL (ref 80.0–100.0)
Monocytes Absolute: 0.3 10*3/uL (ref 0.1–1.0)
Monocytes Relative: 4 %
Neutro Abs: 4.6 10*3/uL (ref 1.7–7.7)
Neutrophils Relative %: 69 %
Platelets: 337 10*3/uL (ref 150–400)
RBC: 4.97 MIL/uL (ref 3.87–5.11)
RDW: 14.5 % (ref 11.5–15.5)
WBC: 6.8 10*3/uL (ref 4.0–10.5)
nRBC: 0 % (ref 0.0–0.2)

## 2019-03-27 LAB — VITAMIN B12: Vitamin B-12: 205 pg/mL (ref 180–914)

## 2019-03-27 NOTE — Progress Notes (Signed)
Marland Kitchen    HEMATOLOGY/ONCOLOGY CLINIC NOTE  Date of Service: .05/09/2018    Patient Care Team: Maurice Small, MD as PCP - General (Family Medicine)  CHIEF COMPLAINTS/PURPOSE OF CONSULTATION:  Iron deficiency Anemia  HISTORY OF PRESENTING ILLNESS:  Theresa Clay is a wonderful 64 y.o. female who has been referred to [place] by Dr Maurice Small, MD  for evaluation and management of Iron deficiency Anemia to consider IV Iron infusion.  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  Patient has recently noted significantly increased fatigue and exhaustion. She notes that she has developed food craving for boiled potatoes. Labs done with her primary care physician recently on 09/22/2016 showed a hemoglobin of 9.2 with a ferritin of 3.2 she notes no overt GI bleeding. Likely has absorption issues related to her Roux-en-Y gastric bypass surgery.   She was previously on by mouth iron replacement for the last 3 years but stopped it due to GI intolerance. Has been on daily multivitamin but no specific B12 or other B vitamin replacements at this time . Patient notes that she has had B12 deficiency in the past and was on B12 shots until 4 years ago. Has been on calcium and vitamin D.   Patient notes no overt GI bleeding no hematemesis no melena no hematochezia no hematuria no epistaxis or gum bleeding. Patient notes she last had a colonoscopy about 2 years ago and whether to have a benign polyp but no other issues with GI bleeding. Had an EGD in 2014 which was unrevealing.  Patient notes that she doesn't think she can tolerate oral iron replacement and really wants her iron replaced since she feels "lousy".  No acute weight loss. No other new focal symptoms.  INTERVAL HISTORY  I connected with  Theresa Clay on 03/28/19 by  telephone and verified that I am speaking with the correct person using two identifiers.   I discussed the limitations of evaluation and management by telemedicine. The patient expressed understanding and agreed to proceed.  Other persons participating in the visit and their role in the encounter:     -Yevette Edwards, Medical Scribe  Patient's location: Home Provider's location: [facility]   Patient is here for f/u if her Iron deficiency Anemia. The patient's last visit with [place] was on 05/09/2018. The pt reports that she is doing well overall.  The pt reports bruising easily and some bruises that have hardened. She has been moving boxes recently and injured herself that way. Pt is not using any OTC pain medication but is using Celebrex. She has been taking her Vitamin B12 injections regularly, however, she did miss one dose in July. She has not been taking any SL Vitamin B12. Pt will see her PCP next month for her annual physical.   Lab results today (03/27/19) of CBC w/diff and CMP is as follows: all values are WNL except for Potassium at 3.4, Chloride at 112, CO2 at 19, AST at 14. 03/27/2019 Ferritin at 174 03/27/2019 Vitamin B12 at 205  On review of systems, pt reports bruising easily and denies abdominal pain, gum bleeding, hematuria, bloody/black stools and any other symptoms.    MEDICAL HISTORY:  Past Medical History:  Diagnosis Date  . Anxiety   . Bipolar 1 disorder ([place])   . Breast cancer ([place]) 12/13/07   left ,  ER/PR +  . Depression   . Esophageal reflux   . Fibromyalgia   . GERD (gastroesophageal reflux disease)   . Hx of radiation therapy 04/04/08-05/17/08   left breast, tumor bed  . Hyperlipemia   . 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 Hypothyroidism   . Osteoarthritis   . Personal history of radiation therapy 2009  History of PCOS status post hysterectomy with bilateral salpingo-oophorectomy 07/1996.     SURGICAL HISTORY: Past Surgical  History:  Procedure Laterality Date  . BREAST BIOPSY Left 01/09/2008  . BREAST LUMPECTOMY Left 01/2008  . CHOLECYSTECTOMY    . GASTRIC BYPASS    . KNEE ARTHROSCOPY Left 08/09/2012   Procedure: ARTHROSCOPY LEFT KNEE chondroplasty;  Surgeon: Hessie Dibble, MD;  Location: [facility];  Service: Orthopedics;  Laterality: Left;  . KNEE SURGERY  10/2011   reconstruction  . TONSILLECTOMY    . TOTAL ABDOMINAL HYSTERECTOMY W/ BILATERAL SALPINGOOPHORECTOMY      SOCIAL HISTORY: Social History   Socioeconomic History  . Marital status: Married    Spouse name: Not on file  . Number of children: 1  . Years of education: college  . Highest education level: Not on file  Occupational History  . Occupation: disabled    Comment: for BPD  Social Needs  . Financial resource strain: Not on file  . Food insecurity    Worry: Not on file    Inability: Not on file  . Transportation needs    Medical: Not on file    Non-medical: Not on file  Tobacco Use  . Smoking status: Current Every Day Smoker    Packs/day: 0.50    Years: 5.00    Pack years: 2.50  . Smokeless tobacco: Never Used  . Tobacco comment: 5-6 cigarettes daily  Substance and Sexual Activity  . Alcohol use: No  . Drug use: No  . Sexual activity: Yes    Birth control/protection: Surgical  Lifestyle  . Physical activity    Days per week: Not on file    Minutes per session: Not on file  . Stress: Not on file  Relationships  . Social Herbalist on phone: Not on file    Gets together: Not on file    Attends religious service: Not on file    Active member of club or organization: Not on file    Attends meetings of clubs or organizations: Not on file    Relationship status: Not on file  . Intimate partner violence    Fear of current or ex partner: Not on file    Emotionally abused: Not on file    Physically abused: Not on file    Forced sexual activity: Not on file  Other Topics Concern  . Not on file   Social History Narrative  . Not on file    FAMILY HISTORY: Family History  Problem Relation Age of Onset  . Breast cancer Mother 4  . Melanoma Mother   . Dementia Father   . Cancer - Prostate Maternal Grandfather     ALLERGIES:  is allergic to darvocet [propoxyphene n-acetaminophen]; penicillins; codeine; hydrocodone; oxycodone; and simvastatin.  MEDICATIONS:  Current Outpatient Medications  Medication Sig Dispense Refill  . amphetamine-dextroamphetamine (ADDERALL XR) 20 MG 24 hr capsule Take 20 mg by mouth 2 (two) times daily.    Marland Kitchen b complex vitamins capsule Take 1 capsule by mouth daily. (Patient not taking: Reported on 05/09/2018) 30 capsule 2  .  celecoxib (CELEBREX) 200 MG capsule Take 200 mg by mouth 2 (two) times daily.    . cyanocobalamin (,VITAMIN B-12,) 1000 MCG/ML injection 1000 mcg [place] injection every month 10 mL 2  . escitalopram (LEXAPRO) 10 MG tablet Take 10 mg by mouth daily.    Marland Kitchen levothyroxine (SYNTHROID, LEVOTHROID) 100 MCG tablet Take 100 mcg by mouth at bedtime.    Marland Kitchen LIDODERM 5 % Place 1-3 patches onto the skin every 12 (twelve) hours as needed (pain).     Marland Kitchen linaclotide (LINZESS) 290 MCG CAPS capsule Take 290 mcg by mouth daily before breakfast.    . lithium carbonate 300 MG capsule Take 300-600 mg by mouth 2 (two) times daily. One tablet in am two tablets pm    . LORazepam (ATIVAN) 1 MG tablet Take 0.5-1 mg by mouth 2 (two) times daily. Take 1mg  at night and 0.5mg  in the morning    . omeprazole (PRILOSEC) 40 MG capsule 40 mg daily.     . pentazocine-naloxone (TALWIN NX) 50-0.5 MG tablet Take 1 tablet by mouth as needed.    Marland Kitchen tiZANidine (ZANAFLEX) 4 MG capsule Take 4 mg by mouth daily. Take daily    . valACYclovir (VALTREX) 500 MG tablet Take 500 mg by mouth daily. One tablet every 12 hours as needed    . Vitamin D, Ergocalciferol, (DRISDOL) 50000 units CAPS capsule TAKE 1 CAPSULE BY MOUTH 2 TIMES A WEEK. 24 capsule 0   No current facility-administered  medications for this visit.     REVIEW OF SYSTEMS:   A 10+ POINT REVIEW OF SYSTEMS WAS OBTAINED including neurology, dermatology, psychiatry, cardiac, respiratory, lymph, extremities, GI, GU, Musculoskeletal, constitutional, breasts, reproductive, HEENT.  All pertinent positives are noted in the HPI.  All others are negative.   PHYSICAL EXAMINATION: ECOG PERFORMANCE STATUS: 2 - Symptomatic, <50% confined to bed  . There were no vitals filed for this visit. There were no vitals filed for this visit. .There is no height or weight on file to calculate BMI.   Telehealth visit  LABORATORY DATA:  I have reviewed the data as listed  . CBC Latest Ref Rng & Units 03/27/2019 05/09/2018 10/11/2017  WBC 4.0 - 10.5 K/uL 6.8 9.3 8.3  Hemoglobin 12.0 - 15.0 g/dL 14.7 14.6 14.1  Hematocrit 36.0 - 46.0 % 44.8 47.0(H) 44.1  Platelets 150 - 400 K/uL 337 302 314   . CBC    Component Value Date/Time   WBC 6.8 03/27/2019 1500   RBC 4.97 03/27/2019 1500   HGB 14.7 03/27/2019 1500   HGB 14.1 10/11/2017 1322   HGB 15.0 12/29/2016 0920   HCT 44.8 03/27/2019 1500   HCT 48.0 (H) 12/29/2016 0920   PLT 337 03/27/2019 1500   PLT 314 10/11/2017 1322   PLT 290 12/29/2016 0920   MCV 90.1 03/27/2019 1500   MCV 89.9 12/29/2016 0920   MCH 29.6 03/27/2019 1500   MCHC 32.8 03/27/2019 1500   RDW 14.5 03/27/2019 1500   RDW 17.5 (H) 12/29/2016 0920   LYMPHSABS 1.8 03/27/2019 1500   LYMPHSABS 1.9 12/29/2016 0920   MONOABS 0.3 03/27/2019 1500   MONOABS 0.4 12/29/2016 0920   EOSABS 0.1 03/27/2019 1500   EOSABS 0.3 12/29/2016 0920   BASOSABS 0.0 03/27/2019 1500   BASOSABS 0.0 12/29/2016 0920     . CMP Latest Ref Rng & Units 03/27/2019 05/09/2018 10/11/2017  Glucose 70 - 99 mg/dL 95 110(H) 95  BUN 8 - 23 mg/dL 11 10 12   Creatinine 0.44 -  1.00 mg/dL 0.95 1.05(H) 1.00  Sodium 135 - 145 mmol/L 142 139 137  Potassium 3.5 - 5.1 mmol/L 3.4(L) 4.6 4.2  Chloride 98 - 111 mmol/L 112(H) 108 107  CO2 22 - 32  mmol/L 19(L) 21(L) 25  Calcium 8.9 - 10.3 mg/dL 9.9 10.1 10.0  Total Protein 6.5 - 8.1 g/dL 6.9 7.1 7.1  Total Bilirubin 0.3 - 1.2 mg/dL 0.4 0.5 0.4  Alkaline Phos 38 - 126 U/L 126 118 124  AST 15 - 41 U/L 14(L) 13(L) 20  ALT 0 - 44 U/L 12 10 18    . Lab Results  Component Value Date   IRON 73 05/09/2018   TIBC 379 05/09/2018   IRONPCTSAT 19 (L) 05/09/2018   (Iron and TIBC)  Lab Results  Component Value Date   FERRITIN 174 03/27/2019       B12 ---133  RADIOGRAPHIC STUDIES: I have personally reviewed the radiological images as listed and agreed with the findings in the report. No results found.  ASSESSMENT & PLAN:   63 y.o. female with  #1 Severe iron deficiency anemia Likely due to poor iron absorption in the setting of chronic PPI therapy and history of Roux-en-Y gastric bypass surgery. No overt issues with GI bleeding. Absorption seems the primary issue since the patient also has coexistent B12 deficiency and vitamin D deficiency.  PLAN:  -Discussed pt labwork today, 03/27/19; all values are WNL except for Potassium at 3.4, Chloride at 112, CO2 at 19, AST at 14. -Discussed10/26/2020 Ferritin at 174, Goal for Ferritin >100 -Discussed 03/27/2019 Vitamin B12 is slightly low at 205 -Recommended pt eat potassium rich foods such as: tomatoes, orange juice and coconut water -Recommended pt discuss her bruising with PCP if it continues -Advised pt that Celebrex could contribute to her bruising -Refill Vitamin B12 injections - 1000 mcg [place] every 4 weeks. -Will see back in 6 months with labs  #2 vitamin B12 deficiency. Likely related to poor absorption due to chronic PPI therapy and Roux-en-Y gastric bypass surgery. Antiparietal cell and anti-intrinsic factor antibodies negative - suggest against pernicious anemia. No clinical evidence of gluten intolerance. B12 levels have improved from 218 to 615 Now down to 133 due to non compliance with SL B12 PLAN: -05/09/18 Vitamin  B12 level are still low at 133, if under-replaced will switch from SL Vitamin B12 to [place], which the pt prefers  -prescribed [place] B12 1000 mcg q4weeks  #3 severe vitamin D deficiency  Likely due to poor absorption due to chronic PPI therapy and Roux-en-Y gastric bypass surgery. Plan -mx per PCP  #4 . Patient Active Problem List   Diagnosis Date Noted  . Iron deficiency anemia 10/08/2016  . Diverticula of small intestine 03/29/2014  . Diverticulitis of duodenum 03/29/2014  . 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 Breast cancer ([place])   . Hx of radiation therapy   . Bipolar 1 disorder ([place])   . Fibromyalgia   . Osteoarthritis    -continue f/u with PCP for mx of other medical co-morbidities.   FOLLOW UP: RTC with Dr Irene Limbo with labs in 6 months  The total time spent in the appt was 15 minutes and more than 50% was on counseling and direct patient cares.  All of the patient's questions were answered with apparent satisfaction. The patient knows to call the clinic with any problems, questions or concerns.   Sullivan Lone MD [place] AAHIVMS [facility] Hematology/Oncology Physician [facility]  (Office):  [phone] (Work cell):  [phone] (Fax):           [phone]  I, Yevette Edwards, am acting as a scribe for Dr. Sullivan Lone.   .I have reviewed the above documentation for accuracy and completeness, and I agree with the above. Brunetta Genera MD

## 2019-03-28 ENCOUNTER — Telehealth: Payer: Self-pay | Admitting: Hematology

## 2019-03-28 ENCOUNTER — Inpatient Hospital Stay (HOSPITAL_BASED_OUTPATIENT_CLINIC_OR_DEPARTMENT_OTHER): Payer: 59 | Admitting: Hematology

## 2019-03-28 DIAGNOSIS — E538 Deficiency of other specified B group vitamins: Secondary | ICD-10-CM

## 2019-03-28 DIAGNOSIS — D509 Iron deficiency anemia, unspecified: Secondary | ICD-10-CM | POA: Diagnosis not present

## 2019-03-28 DIAGNOSIS — Z853 Personal history of malignant neoplasm of breast: Secondary | ICD-10-CM | POA: Diagnosis not present

## 2019-03-28 DIAGNOSIS — D500 Iron deficiency anemia secondary to blood loss (chronic): Secondary | ICD-10-CM

## 2019-03-28 DIAGNOSIS — Z170 Estrogen receptor positive status [ER+]: Secondary | ICD-10-CM | POA: Diagnosis not present

## 2019-03-28 DIAGNOSIS — E785 Hyperlipidemia, unspecified: Secondary | ICD-10-CM | POA: Diagnosis not present

## 2019-03-28 DIAGNOSIS — F17210 Nicotine dependence, cigarettes, uncomplicated: Secondary | ICD-10-CM | POA: Diagnosis not present

## 2019-03-28 DIAGNOSIS — I10 Essential (primary) hypertension: Secondary | ICD-10-CM | POA: Diagnosis not present

## 2019-03-28 LAB — FERRITIN: Ferritin: 174 ng/mL (ref 11–307)

## 2019-03-28 MED ORDER — CYANOCOBALAMIN 1000 MCG/ML IJ SOLN: INTRAMUSCULAR | 2 refills | Status: DC

## 2019-03-28 NOTE — Telephone Encounter (Signed)
Scheduled appt per 10/27 los. ° °Spoke with pt and she is aware of her appt date and time. °

## 2019-04-03 ENCOUNTER — Telehealth: Payer: Self-pay | Admitting: *Deleted

## 2019-04-03 NOTE — Telephone Encounter (Signed)
Patient called - LVM: Supposed to take B12 every 3-4 weeks. Went to pharmacy to pick up after seeing Dr. Irene Limbo last week. They gave her a one month supply and she usually gets a 6 month supply. Contacted [facility], they stated that even though RX written for 10 ml (1029mcg/ml), since monthly dose is 1000 mg, patient's insurance company only allows a one month supply - they could only dispense 1 ml.  Attempted to contact patient with this information. LVM for patient with info and asked her to contact office as Dr. Irene Limbo states RX can be rewritten to cover monthly dosing

## 2019-04-04 MED ORDER — CYANOCOBALAMIN 1000 MCG/ML IJ SOLN: INTRAMUSCULAR | 11 refills | Status: DC

## 2019-04-04 NOTE — Telephone Encounter (Signed)
Contacted patient and pharmacy. Spoke with Alden Benjamin at pharmacy. New RX for 1 ml vial sent to replace previous Rx for 10 ml vial so that insurance will cover monthly dose. Patient verbalized understanding of change in prescription due to insurance requirement

## 2019-05-01 ENCOUNTER — Other Ambulatory Visit: Payer: Self-pay

## 2019-05-01 ENCOUNTER — Ambulatory Visit
Admission: RE | Admit: 2019-05-01 | Discharge: 2019-05-01 | Disposition: A | Discharge status: Home / Self Care | Payer: 59 | Source: Ambulatory Visit | Attending: Family Medicine | Admitting: Family Medicine

## 2019-05-01 DIAGNOSIS — Z1231 Encounter for screening mammogram for malignant neoplasm of breast: Secondary | ICD-10-CM

## 2019-06-20 DIAGNOSIS — F331 Major depressive disorder, recurrent, moderate: Secondary | ICD-10-CM | POA: Diagnosis not present

## 2019-06-29 DIAGNOSIS — R1032 Left lower quadrant pain: Secondary | ICD-10-CM | POA: Diagnosis not present

## 2019-06-29 DIAGNOSIS — M549 Dorsalgia, unspecified: Secondary | ICD-10-CM | POA: Diagnosis not present

## 2019-06-29 DIAGNOSIS — R319 Hematuria, unspecified: Secondary | ICD-10-CM | POA: Diagnosis not present

## 2019-06-29 DIAGNOSIS — K5790 Diverticulosis of intestine, part unspecified, without perforation or abscess without bleeding: Secondary | ICD-10-CM | POA: Diagnosis not present

## 2019-06-30 ENCOUNTER — Other Ambulatory Visit: Payer: Self-pay | Admitting: Family Medicine

## 2019-06-30 DIAGNOSIS — R1032 Left lower quadrant pain: Secondary | ICD-10-CM

## 2019-06-30 DIAGNOSIS — R319 Hematuria, unspecified: Secondary | ICD-10-CM

## 2019-07-25 DIAGNOSIS — F331 Major depressive disorder, recurrent, moderate: Secondary | ICD-10-CM | POA: Diagnosis not present

## 2019-08-14 DIAGNOSIS — F331 Major depressive disorder, recurrent, moderate: Secondary | ICD-10-CM | POA: Diagnosis not present

## 2019-08-16 DIAGNOSIS — Z20822 Contact with and (suspected) exposure to covid-19: Secondary | ICD-10-CM | POA: Diagnosis not present

## 2019-08-22 DIAGNOSIS — F331 Major depressive disorder, recurrent, moderate: Secondary | ICD-10-CM | POA: Diagnosis not present

## 2019-09-27 ENCOUNTER — Inpatient Hospital Stay (HOSPITAL_BASED_OUTPATIENT_CLINIC_OR_DEPARTMENT_OTHER): Payer: 59 | Admitting: Hematology

## 2019-09-27 ENCOUNTER — Other Ambulatory Visit: Payer: Self-pay

## 2019-09-27 ENCOUNTER — Inpatient Hospital Stay: Payer: 59 | Attending: Hematology

## 2019-09-27 VITALS — BP 145/77 | HR 73 | Temp 98.3°F | Resp 158 | Ht 64.0 in | Wt 138.3 lb

## 2019-09-27 DIAGNOSIS — Z90710 Acquired absence of both cervix and uterus: Secondary | ICD-10-CM | POA: Diagnosis not present

## 2019-09-27 DIAGNOSIS — Z923 Personal history of irradiation: Secondary | ICD-10-CM | POA: Diagnosis not present

## 2019-09-27 DIAGNOSIS — E538 Deficiency of other specified B group vitamins: Secondary | ICD-10-CM

## 2019-09-27 DIAGNOSIS — Z853 Personal history of malignant neoplasm of breast: Secondary | ICD-10-CM | POA: Diagnosis not present

## 2019-09-27 DIAGNOSIS — D509 Iron deficiency anemia, unspecified: Secondary | ICD-10-CM | POA: Insufficient documentation

## 2019-09-27 DIAGNOSIS — E559 Vitamin D deficiency, unspecified: Secondary | ICD-10-CM | POA: Diagnosis not present

## 2019-09-27 DIAGNOSIS — Z170 Estrogen receptor positive status [ER+]: Secondary | ICD-10-CM | POA: Insufficient documentation

## 2019-09-27 DIAGNOSIS — Z803 Family history of malignant neoplasm of breast: Secondary | ICD-10-CM | POA: Insufficient documentation

## 2019-09-27 DIAGNOSIS — Z9884 Bariatric surgery status: Secondary | ICD-10-CM | POA: Diagnosis not present

## 2019-09-27 DIAGNOSIS — Z90722 Acquired absence of ovaries, bilateral: Secondary | ICD-10-CM | POA: Insufficient documentation

## 2019-09-27 DIAGNOSIS — D500 Iron deficiency anemia secondary to blood loss (chronic): Secondary | ICD-10-CM

## 2019-09-27 DIAGNOSIS — Z79899 Other long term (current) drug therapy: Secondary | ICD-10-CM | POA: Insufficient documentation

## 2019-09-27 LAB — CBC WITH DIFFERENTIAL/PLATELET
Abs Immature Granulocytes: 0.02 10*3/uL (ref 0.00–0.07)
Basophils Absolute: 0.1 10*3/uL (ref 0.0–0.1)
Basophils Relative: 1 %
Eosinophils Absolute: 0.3 10*3/uL (ref 0.0–0.5)
Eosinophils Relative: 3 %
HCT: 42.7 % (ref 36.0–46.0)
Hemoglobin: 13.6 g/dL (ref 12.0–15.0)
Immature Granulocytes: 0 %
Lymphocytes Relative: 26 %
Lymphs Abs: 2.5 10*3/uL (ref 0.7–4.0)
MCH: 30.2 pg (ref 26.0–34.0)
MCHC: 31.9 g/dL (ref 30.0–36.0)
MCV: 94.9 fL (ref 80.0–100.0)
Monocytes Absolute: 0.4 10*3/uL (ref 0.1–1.0)
Monocytes Relative: 4 %
Neutro Abs: 6.3 10*3/uL (ref 1.7–7.7)
Neutrophils Relative %: 66 %
Platelets: 298 10*3/uL (ref 150–400)
RBC: 4.5 MIL/uL (ref 3.87–5.11)
RDW: 15 % (ref 11.5–15.5)
WBC: 9.6 10*3/uL (ref 4.0–10.5)
nRBC: 0 % (ref 0.0–0.2)

## 2019-09-27 LAB — CMP (CANCER CENTER ONLY)
ALT: 18 U/L (ref 0–44)
AST: 20 U/L (ref 15–41)
Albumin: 3.8 g/dL (ref 3.5–5.0)
Alkaline Phosphatase: 97 U/L (ref 38–126)
Anion gap: 8 (ref 5–15)
BUN: 15 mg/dL (ref 8–23)
CO2: 24 mmol/L (ref 22–32)
Calcium: 9.6 mg/dL (ref 8.9–10.3)
Chloride: 109 mmol/L (ref 98–111)
Creatinine: 1.17 mg/dL — ABNORMAL HIGH (ref 0.44–1.00)
GFR, Est AFR Am: 58 mL/min — ABNORMAL LOW (ref >60)
GFR, Estimated: 50 mL/min — ABNORMAL LOW (ref >60)
Glucose, Bld: 104 mg/dL — ABNORMAL HIGH (ref 70–99)
Potassium: 4.1 mmol/L (ref 3.5–5.1)
Sodium: 141 mmol/L (ref 135–145)
Total Bilirubin: 0.3 mg/dL (ref 0.3–1.2)
Total Protein: 6.5 g/dL (ref 6.5–8.1)

## 2019-09-27 LAB — IRON AND TIBC
Iron: 74 ug/dL (ref 41–142)
Saturation Ratios: 27 % (ref 21–57)
TIBC: 275 ug/dL (ref 236–444)
UIBC: 202 ug/dL (ref 120–384)

## 2019-09-27 LAB — VITAMIN B12: Vitamin B-12: 307 pg/mL (ref 180–914)

## 2019-09-27 LAB — FERRITIN: Ferritin: 118 ng/mL (ref 11–307)

## 2019-09-27 MED ORDER — VITAMIN D (ERGOCALCIFEROL) 1.25 MG (50000 UNIT) PO CAPS: ORAL_CAPSULE | ORAL | 1 refills | Status: DC

## 2019-09-27 MED ORDER — CYANOCOBALAMIN 1000 MCG/ML IJ SOLN: INTRAMUSCULAR | 11 refills | Status: DC

## 2019-09-27 NOTE — Progress Notes (Signed)
Marland Kitchen    HEMATOLOGY/ONCOLOGY CLINIC NOTE  Date of Service: .05/09/2018    Patient Care Team: Maurice Small, MD as PCP - General (Family Medicine)  CHIEF COMPLAINTS/PURPOSE OF CONSULTATION:  Iron deficiency Anemia  HISTORY OF PRESENTING ILLNESS:  Theresa Clay is a wonderful 64 y.o. female who has been referred to [place] by Dr Maurice Small, MD  for evaluation and management of Iron deficiency Anemia to consider IV Iron infusion.  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  Patient has recently noted significantly increased fatigue and exhaustion. She notes that she has developed food craving for boiled potatoes. Labs done with her primary care physician recently on 09/22/2016 showed a hemoglobin of 9.2 with a ferritin of 3.2 she notes no overt GI bleeding. Likely has absorption issues related to her Roux-en-Y gastric bypass surgery.   She was previously on by mouth iron replacement for the last 3 years but stopped it due to GI intolerance. Has been on daily multivitamin but no specific B12 or other B vitamin replacements at this time . Patient notes that she has had B12 deficiency in the past and was on B12 shots until 4 years ago. Has been on calcium and vitamin D.   Patient notes no overt GI bleeding no hematemesis no melena no hematochezia no hematuria no epistaxis or gum bleeding. Patient notes she last had a colonoscopy about 2 years ago and whether to have a benign polyp but no other issues with GI bleeding. Had an EGD in 2014 which was unrevealing.  Patient notes that she doesn't think she can tolerate oral iron replacement and really wants her iron replaced since she feels "lousy".  No acute weight loss. No other new focal symptoms.  INTERVAL HISTORY Patient is here for f/u if her Iron deficiency  Anemia. The patient's last visit with [place] was on 03/28/2019. The pt reports that she is doing well overall.  The pt reports that she has been stressed recently, has not had as much of an appetite, and has lost a significant amount of weight. 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 She has continued taking her Vitamin B12 injections monthly. Pt is currently up to date with age-appropriate cancer screenings and visits Dr. Justin Mend every 6 months.   Lab results today (09/27/19) of CBC w/diff and CMP is as follows: all values are WNL except for Glucose at 104, Creatinine at 1.17, GFR Est Non Af Am at 50. 09/27/2019 Ferritin at 118 09/27/2019 Vitamin B12 at 307 09/27/2019 Iron and TIBC is as follows: Iron at 74, TIBC at 275, Sat Ratios at 27, UIBC at 202  On review of systems, pt reports low appetite, weight loss, abdominal pain, stress and denies fatigue, black/bloody stools and any other symptoms.    MEDICAL HISTORY:  Past Medical History:  Diagnosis Date  . Anxiety   . Bipolar 1 disorder ([place])   . Breast cancer ([place]) 12/13/07   left , ER/PR +  . Depression   . Esophageal reflux   . Fibromyalgia   . GERD (gastroesophageal reflux disease)   . Hx of radiation therapy 04/04/08-05/17/08   left breast, tumor bed  . Hyperlipemia   . 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 Hypothyroidism   . Osteoarthritis   . Personal history of radiation therapy 2009  History of PCOS  status post hysterectomy with bilateral salpingo-oophorectomy 07/1996.     SURGICAL HISTORY: Past Surgical History:  Procedure Laterality Date  . BREAST BIOPSY Left 01/09/2008  . BREAST LUMPECTOMY Left 01/2008  . CHOLECYSTECTOMY    . GASTRIC BYPASS    . KNEE ARTHROSCOPY Left 08/09/2012   Procedure: ARTHROSCOPY LEFT KNEE chondroplasty;  Surgeon: Hessie Dibble, MD;  Location: [facility];  Service: Orthopedics;  Laterality: Left;  . KNEE SURGERY  10/2011   reconstruction  .  TONSILLECTOMY    . TOTAL ABDOMINAL HYSTERECTOMY W/ BILATERAL SALPINGOOPHORECTOMY      SOCIAL HISTORY: Social History   Socioeconomic History  . Marital status: Married    Spouse name: Not on file  . Number of children: 1  . Years of education: college  . Highest education level: Not on file  Occupational History  . Occupation: disabled    Comment: for [employer]  Tobacco Use  . Smoking status: Current Every Day Smoker    Packs/day: 0.50    Years: 5.00    Pack years: 2.50  . Smokeless tobacco: Never Used  . Tobacco comment: 5-6 cigarettes daily  Substance and Sexual Activity  . Alcohol use: No  . Drug use: No  . Sexual activity: Yes    Birth control/protection: Surgical  Other Topics Concern  . Not on file  Social History Narrative  . Not on file   Social Determinants of Health   Financial Resource Strain:   . Difficulty of Paying Living Expenses:   Food Insecurity:   . Worried About Charity fundraiser in the Last Year:   . Arboriculturist in the Last Year:   Transportation Needs:   . Film/video editor (Medical):   Marland Kitchen Lack of Transportation (Non-Medical):   Physical Activity:   . Days of Exercise per Week:   . Minutes of Exercise per Session:   Stress:   . Feeling of Stress :   Social Connections:   . Frequency of Communication with Friends and Family:   . Frequency of Social Gatherings with Friends and Family:   . Attends Religious Services:   . Active Member of Clubs or Organizations:   . Attends Archivist Meetings:   Marland Kitchen Marital Status:   Intimate Partner Violence:   . Fear of Current or Ex-Partner:   . Emotionally Abused:   Marland Kitchen Physically Abused:   . Sexually Abused:     FAMILY HISTORY: Family History  Problem Relation Age of Onset  . Breast cancer Mother 71  . Melanoma Mother   . Dementia Father   . Cancer - Prostate Maternal Grandfather     ALLERGIES:  is allergic to darvocet [propoxyphene n-acetaminophen]; penicillins; codeine;  hydrocodone; oxycodone; and simvastatin.  MEDICATIONS:  Current Outpatient Medications  Medication Sig Dispense Refill  . amphetamine-dextroamphetamine (ADDERALL XR) 20 MG 24 hr capsule Take 20 mg by mouth 2 (two) times daily.    . celecoxib (CELEBREX) 200 MG capsule Take 200 mg by mouth 2 (two) times daily.    . cyanocobalamin (,VITAMIN B-12,) 1000 MCG/ML injection 1000 mcg [place] injection every month 1 mL 11  . escitalopram (LEXAPRO) 10 MG tablet Take 10 mg by mouth daily.    Marland Kitchen levothyroxine (SYNTHROID, LEVOTHROID) 100 MCG tablet Take 100 mcg by mouth at bedtime.    Marland Kitchen LIDODERM 5 % Place 1-3 patches onto the skin every 12 (twelve) hours as needed (pain).     Marland Kitchen linaclotide (LINZESS) 290 MCG CAPS capsule  Take 290 mcg by mouth daily before breakfast.    . LORazepam (ATIVAN) 1 MG tablet Take 0.5-1 mg by mouth 2 (two) times daily. Take 1mg  at night and 0.5mg  in the morning    . pentazocine-naloxone (TALWIN NX) 50-0.5 MG tablet Take 1 tablet by mouth as needed.    Marland Kitchen tiZANidine (ZANAFLEX) 4 MG capsule Take 4 mg by mouth daily. Take daily    . valACYclovir (VALTREX) 500 MG tablet Take 500 mg by mouth daily. One tablet every 12 hours as needed    . Vitamin D, Ergocalciferol, (DRISDOL) 1.25 MG (50000 UNIT) CAPS capsule TAKE 1 CAPSULE BY MOUTH 2 TIMES A WEEK. 24 capsule 1  . b complex vitamins capsule Take 1 capsule by mouth daily. (Patient not taking: Reported on 05/09/2018) 30 capsule 2  . lithium carbonate 300 MG capsule Take 300-600 mg by mouth 2 (two) times daily. One tablet in am two tablets pm    . omeprazole (PRILOSEC) 40 MG capsule 40 mg daily.      No current facility-administered medications for this visit.    REVIEW OF SYSTEMS:   A 10+ POINT REVIEW OF SYSTEMS WAS OBTAINED including neurology, dermatology, psychiatry, cardiac, respiratory, lymph, extremities, GI, GU, Musculoskeletal, constitutional, breasts, reproductive, HEENT.  All pertinent positives are noted in the HPI.  All others are  negative.   PHYSICAL EXAMINATION: ECOG PERFORMANCE STATUS: 2 - Symptomatic, <50% confined to bed  . Vitals:   09/27/19 1444  BP: (!) 145/77  Pulse: 73  Resp: (!) 158  Temp: 98.3 F (36.8 C)  SpO2: 99%   Filed Weights   09/27/19 1444  Weight: 138 lb 4.8 oz (62.7 kg)   .Body mass index is 23.74 kg/m.   GENERAL:alert, in no acute distress and comfortable SKIN: no acute rashes, no significant lesions EYES: conjunctiva are pink and non-injected, sclera anicteric OROPHARYNX: MMM, no exudates, no oropharyngeal erythema or ulceration NECK: supple, no JVD LYMPH:  no palpable lymphadenopathy in the cervical, axillary or inguinal regions LUNGS: clear to auscultation b/l with normal respiratory effort HEART: regular rate & rhythm ABDOMEN:  normoactive bowel sounds , non tender, not distended. No palpable hepatosplenomegaly.  Extremity: no pedal edema PSYCH: alert & oriented x 3 with fluent speech NEURO: no focal motor/sensory deficits  LABORATORY DATA:  I have reviewed the data as listed  . CBC Latest Ref Rng & Units 09/27/2019 03/27/2019 05/09/2018  WBC 4.0 - 10.5 K/uL 9.6 6.8 9.3  Hemoglobin 12.0 - 15.0 g/dL 13.6 14.7 14.6  Hematocrit 36.0 - 46.0 % 42.7 44.8 47.0(H)  Platelets 150 - 400 K/uL 298 337 302   . CBC    Component Value Date/Time   WBC 9.6 09/27/2019 1416   RBC 4.50 09/27/2019 1416   HGB 13.6 09/27/2019 1416   HGB 14.1 10/11/2017 1322   HGB 15.0 12/29/2016 0920   HCT 42.7 09/27/2019 1416   HCT 48.0 (H) 12/29/2016 0920   PLT 298 09/27/2019 1416   PLT 314 10/11/2017 1322   PLT 290 12/29/2016 0920   MCV 94.9 09/27/2019 1416   MCV 89.9 12/29/2016 0920   MCH 30.2 09/27/2019 1416   MCHC 31.9 09/27/2019 1416   RDW 15.0 09/27/2019 1416   RDW 17.5 (H) 12/29/2016 0920   LYMPHSABS 2.5 09/27/2019 1416   LYMPHSABS 1.9 12/29/2016 0920   MONOABS 0.4 09/27/2019 1416   MONOABS 0.4 12/29/2016 0920   EOSABS 0.3 09/27/2019 1416   EOSABS 0.3 12/29/2016 0920   BASOSABS  0.1 09/27/2019 1416  BASOSABS 0.0 12/29/2016 0920     . CMP Latest Ref Rng & Units 09/27/2019 03/27/2019 05/09/2018  Glucose 70 - 99 mg/dL 104(H) 95 110(H)  BUN 8 - 23 mg/dL 15 11 10   Creatinine 0.44 - 1.00 mg/dL 1.17(H) 0.95 1.05(H)  Sodium 135 - 145 mmol/L 141 142 139  Potassium 3.5 - 5.1 mmol/L 4.1 3.4(L) 4.6  Chloride 98 - 111 mmol/L 109 112(H) 108  CO2 22 - 32 mmol/L 24 19(L) 21(L)  Calcium 8.9 - 10.3 mg/dL 9.6 9.9 10.1  Total Protein 6.5 - 8.1 g/dL 6.5 6.9 7.1  Total Bilirubin 0.3 - 1.2 mg/dL 0.3 0.4 0.5  Alkaline Phos 38 - 126 U/L 97 126 118  AST 15 - 41 U/L 20 14(L) 13(L)  ALT 0 - 44 U/L 18 12 10    . Lab Results  Component Value Date   IRON 74 09/27/2019   TIBC 275 09/27/2019   IRONPCTSAT 27 09/27/2019   (Iron and TIBC)  Lab Results  Component Value Date   FERRITIN 118 09/27/2019       B12 ---133--->307   RADIOGRAPHIC STUDIES: I have personally reviewed the radiological images as listed and agreed with the findings in the report. No results found.  ASSESSMENT & PLAN:   64 y.o. female with  #1 Severe iron deficiency anemia Likely due to poor iron absorption in the setting of chronic PPI therapy and history of Roux-en-Y gastric bypass surgery. No overt issues with GI bleeding. Absorption seems the primary issue since the patient also has coexistent B12 deficiency and vitamin D deficiency.  PLAN:  -Discussed pt labwork today, 09/27/19; blood counts are normal, blood chemistries are steady - though pt is dehydrated, Ferritin is holding at 118, Vitamin B12 is okay at 302, Iron and TIBC shows all values WNL  -Goal for Ferritin >100 - currently at goal @ 118 -No anemia. No indication for IV Iron transfusion today.  -Recommended that the pt continue to eat well, drink at least 48-64 oz of water each day, and walk 20-30 minutes each day.  -Recommended pt stay up to date with age-appropriate cancer screenings, especially in the setting of previous Breast  Cancer.  -Recommend pt take weekly 50K UT Vitamin D, get Vitamin D levels checked with next labs, and have Dr. Justin Mend adjust it accordingly.  -Advised pt discuss Synthroid dosage with Dr. Justin Mend. Her weight has dropped significantly in the last few months.  -Rx 50K UT Vitamin D -Refill Vitamin B12 injections - 1000 mcg [place] every 4 weeks. -Will see back in 6 months with labs    #2 vitamin B12 deficiency. Likely related to poor absorption due to chronic PPI therapy and Roux-en-Y gastric bypass surgery. Antiparietal cell and anti-intrinsic factor antibodies negative - suggest against pernicious anemia. No clinical evidence of gluten intolerance. B12 levels wnl @ 307 PLAN: -continue [place] B12 1000 mcg q4weeks  #3 severe vitamin D deficiency  Likely due to poor absorption due to chronic PPI therapy and Roux-en-Y gastric bypass surgery. Plan -mx per PCP  #4 . Patient Active Problem List   Diagnosis Date Noted  . Iron deficiency anemia 10/08/2016  . Diverticula of small intestine 03/29/2014  . Diverticulitis of duodenum 03/29/2014  . 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 Breast cancer ([place])   . Hx of radiation therapy   . Bipolar 1 disorder ([place])   . Fibromyalgia   . Osteoarthritis    -continue f/u with PCP for mx of other medical co-morbidities.  FOLLOW UP: RTC with Dr Irene Limbo with labs in 6 months   The total time spent in the appt was 20 minutes and more than 50% was on counseling and direct patient cares.  All of the patient's questions were answered with apparent satisfaction. The patient knows to call the clinic with any problems, questions or concerns.    Sullivan Lone MD [place] AAHIVMS [facility] Hematology/Oncology Physician [facility]  (Office):       [phone] (Work cell):  [phone] (Fax):           [phone]   I, Yevette Edwards, am acting as a scribe for Dr. Sullivan Lone.   .I have reviewed the above documentation for accuracy and completeness, and I  agree with the above. Brunetta Genera MD

## 2019-09-28 ENCOUNTER — Telehealth: Payer: Self-pay | Admitting: Hematology

## 2019-09-28 NOTE — Telephone Encounter (Signed)
Scheduled per 04/28 los, patient has been called and voicemail was left.

## 2019-11-13 DIAGNOSIS — F331 Major depressive disorder, recurrent, moderate: Secondary | ICD-10-CM | POA: Diagnosis not present

## 2019-11-20 DIAGNOSIS — F331 Major depressive disorder, recurrent, moderate: Secondary | ICD-10-CM | POA: Diagnosis not present

## 2019-12-16 DIAGNOSIS — S90112A Contusion of left great toe without damage to nail, initial encounter: Secondary | ICD-10-CM | POA: Diagnosis not present

## 2020-01-04 DIAGNOSIS — H524 Presbyopia: Secondary | ICD-10-CM | POA: Diagnosis not present

## 2020-01-04 DIAGNOSIS — Q141 Congenital malformation of retina: Secondary | ICD-10-CM | POA: Diagnosis not present

## 2020-01-04 DIAGNOSIS — Z961 Presence of intraocular lens: Secondary | ICD-10-CM | POA: Diagnosis not present

## 2020-01-04 DIAGNOSIS — H35013 Changes in retinal vascular appearance, bilateral: Secondary | ICD-10-CM | POA: Diagnosis not present

## 2020-01-04 DIAGNOSIS — H35033 Hypertensive retinopathy, bilateral: Secondary | ICD-10-CM | POA: Diagnosis not present

## 2020-01-22 DIAGNOSIS — Z6823 Body mass index (BMI) 23.0-23.9, adult: Secondary | ICD-10-CM | POA: Diagnosis not present

## 2020-01-22 DIAGNOSIS — Z20822 Contact with and (suspected) exposure to covid-19: Secondary | ICD-10-CM | POA: Diagnosis not present

## 2020-01-29 DIAGNOSIS — F3341 Major depressive disorder, recurrent, in partial remission: Secondary | ICD-10-CM | POA: Diagnosis not present

## 2020-02-06 ENCOUNTER — Other Ambulatory Visit: Payer: Self-pay | Admitting: Family Medicine

## 2020-02-06 DIAGNOSIS — Z1231 Encounter for screening mammogram for malignant neoplasm of breast: Secondary | ICD-10-CM

## 2020-03-11 DIAGNOSIS — F3341 Major depressive disorder, recurrent, in partial remission: Secondary | ICD-10-CM | POA: Diagnosis not present

## 2020-03-20 ENCOUNTER — Telehealth: Payer: 59 | Admitting: Nurse Practitioner

## 2020-03-20 DIAGNOSIS — J0100 Acute maxillary sinusitis, unspecified: Secondary | ICD-10-CM

## 2020-03-20 DIAGNOSIS — R059 Cough, unspecified: Secondary | ICD-10-CM | POA: Diagnosis not present

## 2020-03-20 DIAGNOSIS — J40 Bronchitis, not specified as acute or chronic: Secondary | ICD-10-CM | POA: Diagnosis not present

## 2020-03-20 DIAGNOSIS — J329 Chronic sinusitis, unspecified: Secondary | ICD-10-CM | POA: Diagnosis not present

## 2020-03-20 MED ORDER — DOXYCYCLINE HYCLATE 100 MG PO TABS: 100.0000 mg | ORAL_TABLET | Freq: Two times a day (BID) | ORAL | 0 refills | Status: DC

## 2020-03-20 NOTE — Progress Notes (Signed)

## 2020-03-24 NOTE — Progress Notes (Signed)
Marland Kitchen    HEMATOLOGY/ONCOLOGY CLINIC NOTE  Date of Service: .05/09/2018    Patient Care Team: Maurice Small, MD as PCP - General (Family Medicine)  CHIEF COMPLAINTS/PURPOSE OF CONSULTATION:  Iron deficiency Anemia  HISTORY OF PRESENTING ILLNESS:  Theresa Clay is a wonderful 64 y.o. female who has been referred to [place] by Dr Maurice Small, MD  for evaluation and management of Iron deficiency Anemia to consider IV Iron infusion.  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  Patient has recently noted significantly increased fatigue and exhaustion. She notes that she has developed food craving for boiled potatoes. Labs done with her primary care physician recently on 09/22/2016 showed a hemoglobin of 9.2 with a ferritin of 3.2 she notes no overt GI bleeding. Likely has absorption issues related to her Roux-en-Y gastric bypass surgery.   She was previously on by mouth iron replacement for the last 3 years but stopped it due to GI intolerance. Has been on daily multivitamin but no specific B12 or other B vitamin replacements at this time . Patient notes that she has had B12 deficiency in the past and was on B12 shots until 4 years ago. Has been on calcium and vitamin D.   Patient notes no overt GI bleeding no hematemesis no melena no hematochezia no hematuria no epistaxis or gum bleeding. Patient notes she last had a colonoscopy about 2 years ago and whether to have a benign polyp but no other issues with GI bleeding. Had an EGD in 2014 which was unrevealing.  Patient notes that she doesn't think she can tolerate oral iron replacement and really wants her iron replaced since she feels "lousy".  No acute weight loss. No other new focal symptoms.  INTERVAL HISTORY  Patient is here for f/u if her Iron deficiency  Anemia. The patient's last visit with [place] was on 09/27/2019. The pt reports that she is doing well overall.  The pt reports having several nonmedical stressors in her life but that things are settling down.  Lab results today (03/24/20) of CBC w/diff and CMP is as follows: Hemoglobin is within normal limits at 14.7 with normal MCV at 89.6.  Mildly elevated WBC count in the setting of recent steroid use.  Normal platelets. 03/28/2020 Ferritin at 127 03/28/2020 Vitamin B12 at 296 03/28/2020 Iron saturation 19%  MEDICAL HISTORY:  Past Medical History:  Diagnosis Date  . Anxiety   . Bipolar 1 disorder ([place])   . Breast cancer ([place]) 12/13/07   left , ER/PR +  . Depression   . Esophageal reflux   . Fibromyalgia   . GERD (gastroesophageal reflux disease)   . Hx of radiation therapy 04/04/08-05/17/08   left breast, tumor bed  . Hyperlipemia   . 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 Hypothyroidism   . Osteoarthritis   . Personal history of radiation therapy 2009  History of PCOS status post hysterectomy with bilateral salpingo-oophorectomy 07/1996.     SURGICAL HISTORY: Past Surgical History:  Procedure Laterality Date  . BREAST BIOPSY Left 01/09/2008  . BREAST LUMPECTOMY Left 01/2008  . CHOLECYSTECTOMY    . GASTRIC BYPASS    . KNEE ARTHROSCOPY Left 08/09/2012   Procedure: ARTHROSCOPY LEFT KNEE chondroplasty;  Surgeon: Hessie Dibble, MD;  Location: [facility];  Service: Orthopedics;  Laterality: Left;  . KNEE SURGERY  10/2011  reconstruction  . TONSILLECTOMY    . TOTAL ABDOMINAL HYSTERECTOMY W/ BILATERAL SALPINGOOPHORECTOMY      SOCIAL HISTORY: Social History   Socioeconomic History  . Marital status: Married    Spouse name: Not on file  . Number of children: 1  . Years of education: college  . Highest education level: Not on file  Occupational History  . Occupation: disabled    Comment: for [employer]  Tobacco Use  . Smoking status: Current Every Day Smoker     Packs/day: 0.50    Years: 5.00    Pack years: 2.50  . Smokeless tobacco: Never Used  . Tobacco comment: 5-6 cigarettes daily  Vaping Use  . Vaping Use: Never used  Substance and Sexual Activity  . Alcohol use: No  . Drug use: No  . Sexual activity: Yes    Birth control/protection: Surgical  Other Topics Concern  . Not on file  Social History Narrative  . Not on file   Social Determinants of Health   Financial Resource Strain:   . Difficulty of Paying Living Expenses: Not on file  Food Insecurity:   . Worried About Charity fundraiser in the Last Year: Not on file  . Ran Out of Food in the Last Year: Not on file  Transportation Needs:   . Lack of Transportation (Medical): Not on file  . Lack of Transportation (Non-Medical): Not on file  Physical Activity:   . Days of Exercise per Week: Not on file  . Minutes of Exercise per Session: Not on file  Stress:   . Feeling of Stress : Not on file  Social Connections:   . Frequency of Communication with Friends and Family: Not on file  . Frequency of Social Gatherings with Friends and Family: Not on file  . Attends Religious Services: Not on file  . Active Member of Clubs or Organizations: Not on file  . Attends Archivist Meetings: Not on file  . Marital Status: Not on file  Intimate Partner Violence:   . Fear of Current or Ex-Partner: Not on file  . Emotionally Abused: Not on file  . Physically Abused: Not on file  . Sexually Abused: Not on file    FAMILY HISTORY: Family History  Problem Relation Age of Onset  . Breast cancer Mother 36  . Melanoma Mother   . Dementia Father   . Cancer - Prostate Maternal Grandfather     ALLERGIES:  is allergic to darvocet [propoxyphene n-acetaminophen], penicillins, codeine, hydrocodone, oxycodone, and simvastatin.  MEDICATIONS:  Current Outpatient Medications  Medication Sig Dispense Refill  . amphetamine-dextroamphetamine (ADDERALL XR) 20 MG 24 hr capsule Take 20 mg by  mouth 2 (two) times daily.    Marland Kitchen b complex vitamins capsule Take 1 capsule by mouth daily. (Patient not taking: Reported on 05/09/2018) 30 capsule 2  . celecoxib (CELEBREX) 200 MG capsule Take 200 mg by mouth 2 (two) times daily.    . cyanocobalamin (,VITAMIN B-12,) 1000 MCG/ML injection 1000 mcg [place] injection every month 1 mL 11  . doxycycline (VIBRA-TABS) 100 MG tablet Take 1 tablet (100 mg total) by mouth 2 (two) times daily. 1 po bid 20 tablet 0  . escitalopram (LEXAPRO) 10 MG tablet Take 10 mg by mouth daily.    Marland Kitchen levothyroxine (SYNTHROID, LEVOTHROID) 100 MCG tablet Take 100 mcg by mouth at bedtime.    Marland Kitchen LIDODERM 5 % Place 1-3 patches onto the skin every 12 (twelve) hours as needed (pain).     Marland Kitchen  linaclotide (LINZESS) 290 MCG CAPS capsule Take 290 mcg by mouth daily before breakfast.    . lithium carbonate 300 MG capsule Take 300-600 mg by mouth 2 (two) times daily. One tablet in am two tablets pm    . LORazepam (ATIVAN) 1 MG tablet Take 0.5-1 mg by mouth 2 (two) times daily. Take 1mg  at night and 0.5mg  in the morning    . omeprazole (PRILOSEC) 40 MG capsule 40 mg daily.     . pentazocine-naloxone (TALWIN NX) 50-0.5 MG tablet Take 1 tablet by mouth as needed.    Marland Kitchen tiZANidine (ZANAFLEX) 4 MG capsule Take 4 mg by mouth daily. Take daily    . valACYclovir (VALTREX) 500 MG tablet Take 500 mg by mouth daily. One tablet every 12 hours as needed    . Vitamin D, Ergocalciferol, (DRISDOL) 1.25 MG (50000 UNIT) CAPS capsule TAKE 1 CAPSULE BY MOUTH 2 TIMES A WEEK. 24 capsule 1   No current facility-administered medications for this visit.    REVIEW OF SYSTEMS:   A 10+ POINT REVIEW OF SYSTEMS WAS OBTAINED including neurology, dermatology, psychiatry, cardiac, respiratory, lymph, extremities, GI, GU, Musculoskeletal, constitutional, breasts, reproductive, HEENT.  All pertinent positives are noted in the HPI.  All others are negative.   PHYSICAL EXAMINATION: ECOG PERFORMANCE STATUS: 2 - Symptomatic, <50%  confined to bed  . Vitals:   03/28/20 1338  BP: (!) 158/81  Pulse: 97  Resp: 18  Temp: 99.2 F (37.3 C)  SpO2: 99%   Filed Weights   03/28/20 1338  Weight: 129 lb 4.8 oz (58.7 kg)   .Body mass index is 22.19 kg/m.   GENERAL:alert, in no acute distress and comfortable SKIN: no acute rashes, no significant lesions EYES: conjunctiva are pink and non-injected, sclera anicteric OROPHARYNX: MMM, no exudates, no oropharyngeal erythema or ulceration NECK: supple, no JVD LYMPH:  no palpable lymphadenopathy in the cervical, axillary or inguinal regions LUNGS: clear to auscultation b/l with normal respiratory effort HEART: regular rate & rhythm ABDOMEN:  normoactive bowel sounds , non tender, not distended. No palpable hepatosplenomegaly.  Extremity: no pedal edema PSYCH: alert & oriented x 3 with fluent speech NEURO: no focal motor/sensory deficits  LABORATORY DATA:  I have reviewed the data as listed  . CBC Latest Ref Rng & Units 03/28/2020 09/27/2019 03/27/2019  WBC 4.0 - 10.5 K/uL 12.5(H) 9.6 6.8  Hemoglobin 12.0 - 15.0 g/dL 14.7 13.6 14.7  Hematocrit 36 - 46 % 46.6(H) 42.7 44.8  Platelets 150 - 400 K/uL 331 298 337   . CBC    Component Value Date/Time   WBC 12.5 (H) 03/28/2020 1322   WBC 9.6 09/27/2019 1416   RBC 5.20 (H) 03/28/2020 1322   HGB 14.7 03/28/2020 1322   HGB 15.0 12/29/2016 0920   HCT 46.6 (H) 03/28/2020 1322   HCT 48.0 (H) 12/29/2016 0920   PLT 331 03/28/2020 1322   PLT 290 12/29/2016 0920   MCV 89.6 03/28/2020 1322   MCV 89.9 12/29/2016 0920   MCH 28.3 03/28/2020 1322   MCHC 31.5 03/28/2020 1322   RDW 14.5 03/28/2020 1322   RDW 17.5 (H) 12/29/2016 0920   LYMPHSABS 2.6 03/28/2020 1322   LYMPHSABS 1.9 12/29/2016 0920   MONOABS 0.6 03/28/2020 1322   MONOABS 0.4 12/29/2016 0920   EOSABS 0.3 03/28/2020 1322   EOSABS 0.3 12/29/2016 0920   BASOSABS 0.1 03/28/2020 1322   BASOSABS 0.0 12/29/2016 0920     . CMP Latest Ref Rng & Units 09/27/2019  03/27/2019  05/09/2018  Glucose 70 - 99 mg/dL 104(H) 95 110(H)  BUN 8 - 23 mg/dL 15 11 10   Creatinine 0.44 - 1.00 mg/dL 1.17(H) 0.95 1.05(H)  Sodium 135 - 145 mmol/L 141 142 139  Potassium 3.5 - 5.1 mmol/L 4.1 3.4(L) 4.6  Chloride 98 - 111 mmol/L 109 112(H) 108  CO2 22 - 32 mmol/L 24 19(L) 21(L)  Calcium 8.9 - 10.3 mg/dL 9.6 9.9 10.1  Total Protein 6.5 - 8.1 g/dL 6.5 6.9 7.1  Total Bilirubin 0.3 - 1.2 mg/dL 0.3 0.4 0.5  Alkaline Phos 38 - 126 U/L 97 126 118  AST 15 - 41 U/L 20 14(L) 13(L)  ALT 0 - 44 U/L 18 12 10    . Lab Results  Component Value Date   IRON 53 03/28/2020   TIBC 283 03/28/2020   IRONPCTSAT 19 (L) 03/28/2020   (Iron and TIBC)  Lab Results  Component Value Date   FERRITIN 127 03/28/2020       B12 ---133--->307   RADIOGRAPHIC STUDIES: I have personally reviewed the radiological images as listed and agreed with the findings in the report. No results found.  ASSESSMENT & PLAN:   64 y.o. female with  #1 history of severe iron deficiency anemia Likely due to poor iron absorption in the setting of chronic PPI therapy and history of Roux-en-Y gastric bypass surgery. No overt issues with GI bleeding. Absorption seems the primary issue since the patient also has coexistent B12 deficiency and vitamin D deficiency.  PLAN:  -Patient's hemoglobin is within normal limits at 14.7 today with normal MCV. -Ferritin is 127 with an iron saturation of 19%.  -No indication for additional IV iron replacement at this time.  #2 vitamin B12 deficiency. Likely related to poor absorption due to chronic PPI therapy and Roux-en-Y gastric bypass surgery. Antiparietal cell and anti-intrinsic factor antibodies negative -no evidence of pernicious anemia. No clinical evidence of gluten intolerance. B12 levels wnl @ about 300 PLAN: -continue [place] B12 1000 mcg q4weeks  #3 severe vitamin D deficiency  Likely due to poor absorption due to chronic PPI therapy and Roux-en-Y gastric  bypass surgery. Plan -mx per PCP  #4 . Patient Active Problem List   Diagnosis Date Noted  . Iron deficiency anemia 10/08/2016  . Diverticula of small intestine 03/29/2014  . Diverticulitis of duodenum 03/29/2014  . 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 Breast cancer ([place])   . Hx of radiation therapy   . Bipolar 1 disorder ([place])   . Fibromyalgia   . Osteoarthritis    -continue f/u with PCP for mx of other medical co-morbidities.   FOLLOW UP: Return to clinic with Dr. Irene Limbo with labs in 6 months   The total time spent in the appt was 20 minutes and more than 50% was on counseling and direct patient cares.  All of the patient's questions were answered with apparent satisfaction. The patient knows to call the clinic with any problems, questions or concerns.    Sullivan Lone MD [facility] AAHIVMS [facility] Hematology/Oncology [facility]  (Office):       [phone] (Work cell):  [phone] (Fax):           [phone]   I, Yevette Edwards, am acting as a scribe for Dr. Sullivan Lone.   .I have reviewed the above documentation for accuracy and completeness, and I agree with the above. Brunetta Genera MD

## 2020-03-25 DIAGNOSIS — F331 Major depressive disorder, recurrent, moderate: Secondary | ICD-10-CM | POA: Diagnosis not present

## 2020-03-27 ENCOUNTER — Other Ambulatory Visit: Payer: Self-pay | Admitting: Medical Oncology

## 2020-03-27 DIAGNOSIS — D500 Iron deficiency anemia secondary to blood loss (chronic): Secondary | ICD-10-CM

## 2020-03-28 ENCOUNTER — Inpatient Hospital Stay (HOSPITAL_BASED_OUTPATIENT_CLINIC_OR_DEPARTMENT_OTHER): Payer: 59 | Admitting: Hematology

## 2020-03-28 ENCOUNTER — Inpatient Hospital Stay: Payer: 59 | Attending: Hematology

## 2020-03-28 ENCOUNTER — Other Ambulatory Visit: Payer: Self-pay

## 2020-03-28 ENCOUNTER — Other Ambulatory Visit: Payer: Self-pay | Admitting: *Deleted

## 2020-03-28 VITALS — BP 158/81 | HR 97 | Temp 99.2°F | Resp 18 | Ht 64.0 in | Wt 129.3 lb

## 2020-03-28 DIAGNOSIS — F17210 Nicotine dependence, cigarettes, uncomplicated: Secondary | ICD-10-CM | POA: Insufficient documentation

## 2020-03-28 DIAGNOSIS — Z923 Personal history of irradiation: Secondary | ICD-10-CM | POA: Insufficient documentation

## 2020-03-28 DIAGNOSIS — E538 Deficiency of other specified B group vitamins: Secondary | ICD-10-CM | POA: Diagnosis not present

## 2020-03-28 DIAGNOSIS — J329 Chronic sinusitis, unspecified: Secondary | ICD-10-CM | POA: Diagnosis not present

## 2020-03-28 DIAGNOSIS — E039 Hypothyroidism, unspecified: Secondary | ICD-10-CM | POA: Insufficient documentation

## 2020-03-28 DIAGNOSIS — Z803 Family history of malignant neoplasm of breast: Secondary | ICD-10-CM | POA: Insufficient documentation

## 2020-03-28 DIAGNOSIS — Z170 Estrogen receptor positive status [ER+]: Secondary | ICD-10-CM | POA: Diagnosis not present

## 2020-03-28 DIAGNOSIS — D500 Iron deficiency anemia secondary to blood loss (chronic): Secondary | ICD-10-CM

## 2020-03-28 DIAGNOSIS — Z79899 Other long term (current) drug therapy: Secondary | ICD-10-CM | POA: Insufficient documentation

## 2020-03-28 DIAGNOSIS — E559 Vitamin D deficiency, unspecified: Secondary | ICD-10-CM | POA: Insufficient documentation

## 2020-03-28 DIAGNOSIS — R0981 Nasal congestion: Secondary | ICD-10-CM | POA: Diagnosis not present

## 2020-03-28 DIAGNOSIS — Z8042 Family history of malignant neoplasm of prostate: Secondary | ICD-10-CM | POA: Insufficient documentation

## 2020-03-28 DIAGNOSIS — Z90710 Acquired absence of both cervix and uterus: Secondary | ICD-10-CM | POA: Insufficient documentation

## 2020-03-28 DIAGNOSIS — F319 Bipolar disorder, unspecified: Secondary | ICD-10-CM | POA: Diagnosis not present

## 2020-03-28 DIAGNOSIS — Z9884 Bariatric surgery status: Secondary | ICD-10-CM | POA: Insufficient documentation

## 2020-03-28 DIAGNOSIS — I10 Essential (primary) hypertension: Secondary | ICD-10-CM | POA: Insufficient documentation

## 2020-03-28 DIAGNOSIS — F419 Anxiety disorder, unspecified: Secondary | ICD-10-CM | POA: Diagnosis not present

## 2020-03-28 DIAGNOSIS — Z90722 Acquired absence of ovaries, bilateral: Secondary | ICD-10-CM | POA: Diagnosis not present

## 2020-03-28 DIAGNOSIS — D509 Iron deficiency anemia, unspecified: Secondary | ICD-10-CM | POA: Diagnosis not present

## 2020-03-28 DIAGNOSIS — Z853 Personal history of malignant neoplasm of breast: Secondary | ICD-10-CM | POA: Insufficient documentation

## 2020-03-28 DIAGNOSIS — E785 Hyperlipidemia, unspecified: Secondary | ICD-10-CM | POA: Insufficient documentation

## 2020-03-28 DIAGNOSIS — J40 Bronchitis, not specified as acute or chronic: Secondary | ICD-10-CM | POA: Diagnosis not present

## 2020-03-28 DIAGNOSIS — R059 Cough, unspecified: Secondary | ICD-10-CM | POA: Diagnosis not present

## 2020-03-28 LAB — IRON AND TIBC
Iron: 53 ug/dL (ref 41–142)
Saturation Ratios: 19 % — ABNORMAL LOW (ref 21–57)
TIBC: 283 ug/dL (ref 236–444)
UIBC: 229 ug/dL (ref 120–384)

## 2020-03-28 LAB — CBC WITH DIFFERENTIAL (CANCER CENTER ONLY)
Abs Immature Granulocytes: 0.05 10*3/uL (ref 0.00–0.07)
Basophils Absolute: 0.1 10*3/uL (ref 0.0–0.1)
Basophils Relative: 0 %
Eosinophils Absolute: 0.3 10*3/uL (ref 0.0–0.5)
Eosinophils Relative: 2 %
HCT: 46.6 % — ABNORMAL HIGH (ref 36.0–46.0)
Hemoglobin: 14.7 g/dL (ref 12.0–15.0)
Immature Granulocytes: 0 %
Lymphocytes Relative: 21 %
Lymphs Abs: 2.6 10*3/uL (ref 0.7–4.0)
MCH: 28.3 pg (ref 26.0–34.0)
MCHC: 31.5 g/dL (ref 30.0–36.0)
MCV: 89.6 fL (ref 80.0–100.0)
Monocytes Absolute: 0.6 10*3/uL (ref 0.1–1.0)
Monocytes Relative: 5 %
Neutro Abs: 8.9 10*3/uL — ABNORMAL HIGH (ref 1.7–7.7)
Neutrophils Relative %: 72 %
Platelet Count: 331 10*3/uL (ref 150–400)
RBC: 5.2 MIL/uL — ABNORMAL HIGH (ref 3.87–5.11)
RDW: 14.5 % (ref 11.5–15.5)
WBC Count: 12.5 10*3/uL — ABNORMAL HIGH (ref 4.0–10.5)
nRBC: 0 % (ref 0.0–0.2)

## 2020-03-28 LAB — CMP (CANCER CENTER ONLY)
ALT: 12 U/L (ref 0–44)
AST: 11 U/L — ABNORMAL LOW (ref 15–41)
Albumin: 3.6 g/dL (ref 3.5–5.0)
Alkaline Phosphatase: 112 U/L (ref 38–126)
Anion gap: 8 (ref 5–15)
BUN: 12 mg/dL (ref 8–23)
CO2: 23 mmol/L (ref 22–32)
Calcium: 9.6 mg/dL (ref 8.9–10.3)
Chloride: 107 mmol/L (ref 98–111)
Creatinine: 1.09 mg/dL — ABNORMAL HIGH (ref 0.44–1.00)
GFR, Estimated: 57 mL/min — ABNORMAL LOW (ref >60)
Glucose, Bld: 151 mg/dL — ABNORMAL HIGH (ref 70–99)
Potassium: 4 mmol/L (ref 3.5–5.1)
Sodium: 138 mmol/L (ref 135–145)
Total Bilirubin: 0.3 mg/dL (ref 0.3–1.2)
Total Protein: 6.6 g/dL (ref 6.5–8.1)

## 2020-03-28 LAB — VITAMIN B12: Vitamin B-12: 296 pg/mL (ref 180–914)

## 2020-03-28 LAB — FERRITIN: Ferritin: 127 ng/mL (ref 11–307)

## 2020-05-01 ENCOUNTER — Other Ambulatory Visit: Payer: Self-pay

## 2020-05-01 ENCOUNTER — Ambulatory Visit
Admission: RE | Admit: 2020-05-01 | Discharge: 2020-05-01 | Disposition: A | Discharge status: Home / Self Care | Payer: 59 | Source: Ambulatory Visit | Attending: Family Medicine | Admitting: Family Medicine

## 2020-05-01 DIAGNOSIS — Z1231 Encounter for screening mammogram for malignant neoplasm of breast: Secondary | ICD-10-CM

## 2020-05-16 ENCOUNTER — Ambulatory Visit (INDEPENDENT_AMBULATORY_CARE_PROVIDER_SITE_OTHER): Payer: 59 | Admitting: Plastic Surgery

## 2020-05-16 ENCOUNTER — Encounter: Payer: Self-pay | Admitting: Plastic Surgery

## 2020-05-16 ENCOUNTER — Other Ambulatory Visit: Payer: Self-pay

## 2020-05-16 VITALS — BP 146/79 | HR 104 | Temp 98.4°F | Ht 62.0 in | Wt 130.0 lb

## 2020-05-16 DIAGNOSIS — M793 Panniculitis, unspecified: Secondary | ICD-10-CM | POA: Diagnosis not present

## 2020-05-16 DIAGNOSIS — C50412 Malignant neoplasm of upper-outer quadrant of left female breast: Secondary | ICD-10-CM | POA: Diagnosis not present

## 2020-05-16 DIAGNOSIS — Z411 Encounter for cosmetic surgery: Secondary | ICD-10-CM

## 2020-05-16 NOTE — Progress Notes (Signed)
Referring Provider Maurice Small, MD [facility] [address],  [place] [address]   CC:  Chief Complaint  Patient presents with  . Advice Only      Theresa Clay is an 64 y.o. female.  HPI: Patient presents to discuss her abdominal contour after weight loss.  She had a gastric bypass years ago and lost about 170 pounds.  Her weight has been stable for the last couple years.  She has overhanging skin and gets rashes beneath the crease.  These been treated by her primary care physician have been refractory to prescription treatments.  She is interested in surgical treatment of this area.  Other abdominal surgeries include a laparoscopic cholecystectomy and a hysterectomy.  She would also like to discuss her breast asymmetry.  She had a lumpectomy and radiation on the left side years ago by Dr. Brantley Stage.  She is bothered by the asymmetry with the right side being bigger and longer.  She is interested in surgical correction of this.  Allergies  Allergen Reactions  . Darvocet [Propoxyphene N-Acetaminophen] Anaphylaxis  . Penicillins Anaphylaxis  . Codeine Other (See Comments)    "double over in pain" per pt  . Hydrocodone Nausea And Vomiting    pain  . Oxycodone Nausea And Vomiting    pain  . Simvastatin     myalgias    Outpatient Encounter Medications as of 05/16/2020  Medication Sig  . amphetamine-dextroamphetamine (ADDERALL XR) 20 MG 24 hr capsule Take 20 mg by mouth 2 (two) times daily.  Marland Kitchen b complex vitamins capsule Take 1 capsule by mouth daily. (Patient not taking: Reported on 05/09/2018)  . celecoxib (CELEBREX) 200 MG capsule Take 200 mg by mouth 2 (two) times daily.  . cyanocobalamin (,VITAMIN B-12,) 1000 MCG/ML injection 1000 mcg [place] injection every month  . doxycycline (VIBRA-TABS) 100 MG tablet Take 1 tablet (100 mg total) by mouth 2 (two) times daily. 1 po bid  . escitalopram (LEXAPRO) 10 MG tablet Take 10 mg by mouth daily.  Marland Kitchen levothyroxine (SYNTHROID,  LEVOTHROID) 100 MCG tablet Take 100 mcg by mouth at bedtime.  Marland Kitchen LIDODERM 5 % Place 1-3 patches onto the skin every 12 (twelve) hours as needed (pain).   Marland Kitchen linaclotide (LINZESS) 290 MCG CAPS capsule Take 290 mcg by mouth daily before breakfast.  . lithium carbonate 300 MG capsule Take 300-600 mg by mouth 2 (two) times daily. One tablet in am two tablets pm  . LORazepam (ATIVAN) 1 MG tablet Take 0.5-1 mg by mouth 2 (two) times daily. Take 1mg  at night and 0.5mg  in the morning  . omeprazole (PRILOSEC) 40 MG capsule 40 mg daily.   . pentazocine-naloxone (TALWIN NX) 50-0.5 MG tablet Take 1 tablet by mouth as needed.  Marland Kitchen tiZANidine (ZANAFLEX) 4 MG capsule Take 4 mg by mouth daily. Take daily  . valACYclovir (VALTREX) 500 MG tablet Take 500 mg by mouth daily. One tablet every 12 hours as needed  . Vitamin D, Ergocalciferol, (DRISDOL) 1.25 MG (50000 UNIT) CAPS capsule TAKE 1 CAPSULE BY MOUTH 2 TIMES A WEEK.   No facility-administered encounter medications on file as of 05/16/2020.     Past Medical History:  Diagnosis Date  . Anxiety   . Bipolar 1 disorder ([place])   . Breast cancer ([place]) 12/13/07   left , ER/PR +  . Depression   . Esophageal reflux   . Fibromyalgia   . GERD (gastroesophageal reflux disease)   . Hx of radiation therapy 04/04/08-05/17/08  left breast, tumor bed  . Hyperlipemia   . 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 Hypothyroidism   . Osteoarthritis   . Personal history of radiation therapy 2009    Past Surgical History:  Procedure Laterality Date  . BREAST BIOPSY Left 01/09/2008  . BREAST LUMPECTOMY Left 01/2008  . CHOLECYSTECTOMY    . GASTRIC BYPASS    . KNEE ARTHROSCOPY Left 08/09/2012   Procedure: ARTHROSCOPY LEFT KNEE chondroplasty;  Surgeon: Hessie Dibble, MD;  Location: [facility];  Service: Orthopedics;  Laterality: Left;  . KNEE SURGERY  10/2011   reconstruction  . TONSILLECTOMY    . TOTAL ABDOMINAL HYSTERECTOMY W/ BILATERAL SALPINGOOPHORECTOMY       Family History  Problem Relation Age of Onset  . Breast cancer Mother 8  . Melanoma Mother   . Dementia Father   . Cancer - Prostate Maternal Grandfather     Social History   Social History Narrative  . Not on file  Reports current tobacco use  Review of Systems General: Denies fevers, chills, weight loss CV: Denies chest pain, shortness of breath, palpitations  Physical Exam Vitals with BMI 05/16/2020 03/28/2020 09/27/2019  Height 5\' 2"  5\' 4"  5\' 4"   Weight 130 lbs 129 lbs 5 oz 138 lbs 5 oz  BMI 23.77 89.21 19.41  Systolic 740 814 481  Diastolic 79 81 77  Pulse 856 97 73    General:  No acute distress,  Alert and oriented, Non-Toxic, Normal speech and affect Abdomen: Abdomen is soft nontender.  I do not appreciate any hernias.  She has several laparoscopic scars and a lower transverse scar from her hysterectomy.  She has quite a bit of excess skin but not much adipose tissue.  There is a little bit of abdominal wall laxity. Breast: She does have notable breast asymmetry with the right side being longer and slightly bigger.  The scar from her left side is in the upper outer quadrant and looks to have healed fine.  Assessment/Plan Patient is a good candidate for panniculectomy.  We discussed the procedure in detail including the risks of bleeding, infection, damage surrounding structures and need for additional procedures.  I discussed the upper abdominal portion which I did not think would be covered through insurance.  She has quite a bit of skin laxity in that area and I think would benefit from elevation dissection superiorly.  She also asked about the mons area and I believe this area would be treated nicely with liposuction.  She could have a conservative plication as well which should make a difference for her.  I discussed the location and orientation of the scars of this procedure and expected postoperative recovery.  I did explain that her smoking status would affect her  candidacy for this surgery.  She seems confident that she can quit and we would make sure to test her nicotine levels a week or 2 before surgery.  Rick regarding the breast I think she is a good candidate for a right sided mastopexy which would improve her symmetry.  I explained the scars would go periareolar, vertical and in the inframammary crease.  We discussed the risks include bleeding, infection, damage to surrounding structures and need for additional procedures.  We discussed the potential for nipple sensation changes.  Given her breast cancer diagnosis I think she would be a good candidate to have insurance cover this as well.  She does not want anything done to the left side.  Cindra Presume 05/16/2020, 10:43 AM

## 2020-05-19 DIAGNOSIS — Z23 Encounter for immunization: Secondary | ICD-10-CM | POA: Diagnosis not present

## 2020-07-02 DIAGNOSIS — F331 Major depressive disorder, recurrent, moderate: Secondary | ICD-10-CM | POA: Diagnosis not present

## 2020-07-18 DIAGNOSIS — F331 Major depressive disorder, recurrent, moderate: Secondary | ICD-10-CM | POA: Diagnosis not present

## 2020-07-22 DIAGNOSIS — F331 Major depressive disorder, recurrent, moderate: Secondary | ICD-10-CM | POA: Diagnosis not present

## 2020-09-09 DIAGNOSIS — F331 Major depressive disorder, recurrent, moderate: Secondary | ICD-10-CM | POA: Diagnosis not present

## 2020-09-14 ENCOUNTER — Telehealth: Payer: 59 | Admitting: Physician Assistant

## 2020-09-14 DIAGNOSIS — J208 Acute bronchitis due to other specified organisms: Secondary | ICD-10-CM

## 2020-09-14 DIAGNOSIS — B9689 Other specified bacterial agents as the cause of diseases classified elsewhere: Secondary | ICD-10-CM

## 2020-09-14 MED ORDER — BENZONATATE 200 MG PO CAPS: 200.0000 mg | ORAL_CAPSULE | Freq: Three times a day (TID) | ORAL | 0 refills | Status: DC | PRN

## 2020-09-14 MED ORDER — AZITHROMYCIN 250 MG PO TABS: ORAL_TABLET | ORAL | 0 refills | Status: DC

## 2020-09-14 NOTE — Progress Notes (Signed)
We are sorry that you are not feeling well.  Here is how we plan to help!  Based on your presentation I believe you most likely have A cough due to bacteria.  When patients have a fever and a productive cough with a change in color or increased sputum production, we are concerned about bacterial bronchitis.  If left untreated it can progress to pneumonia.  If your symptoms do not improve with your treatment plan it is important that you contact your provider.   I have prescribed Azithromyin 250 mg: two tablets now and then one tablet daily for 4 additonal days    In addition you may use A prescription cough medication called Tessalon Perles 100mg . You may take 1-2 capsules every 8 hours as needed for your cough.  From your responses in the eVisit questionnaire you describe inflammation in the upper respiratory tract which is causing a significant cough.  This is commonly called Bronchitis and has four common causes:    Allergies  Viral Infections  Acid Reflux  Bacterial Infection Allergies, viruses and acid reflux are treated by controlling symptoms or eliminating the cause. An example might be a cough caused by taking certain blood pressure medications. You stop the cough by changing the medication. Another example might be a cough caused by acid reflux. Controlling the reflux helps control the cough.  USE OF BRONCHODILATOR ("RESCUE") INHALERS: There is a risk from using your bronchodilator too frequently.  The risk is that over-reliance on a medication which only relaxes the muscles surrounding the breathing tubes can reduce the effectiveness of medications prescribed to reduce swelling and congestion of the tubes themselves.  Although you feel brief relief from the bronchodilator inhaler, your asthma may actually be worsening with the tubes becoming more swollen and filled with mucus.  This can delay other crucial treatments, such as oral steroid medications. If you need to use a bronchodilator  inhaler daily, several times per day, you should discuss this with your provider.  There are probably better treatments that could be used to keep your asthma under control.     HOME CARE . Only take medications as instructed by your medical team. . Complete the entire course of an antibiotic. . Drink plenty of fluids and get plenty of rest. . Avoid close contacts especially the very young and the elderly . Cover your mouth if you cough or cough into your sleeve. . Always remember to wash your hands . A steam or ultrasonic humidifier can help congestion.   GET HELP RIGHT AWAY IF: . You develop worsening fever. . You become short of breath . You cough up blood. . Your symptoms persist after you have completed your treatment plan MAKE SURE YOU   Understand these instructions.  Will watch your condition.  Will get help right away if you are not doing well or get worse.  Your e-visit answers were reviewed by a board certified advanced clinical practitioner to complete your personal care plan.  Depending on the condition, your plan could have included both over the counter or prescription medications. If there is a problem please reply  once you have received a response from your provider. Your safety is important to [place].  If you have drug allergies check your prescription carefully.    You can use MyChart to ask questions about today's visit, request a non-urgent call back, or ask for a work or school excuse for 24 hours related to this e-Visit. If it has been greater  than 24 hours you will need to follow up with your provider, or enter a new e-Visit to address those concerns. You will get an e-mail in the next two days asking about your experience.  I hope that your e-visit has been valuable and will speed your recovery. Thank you for using e-visits.  I provided 6 minutes of non face-to-face time during this encounter for chart review and documentation.

## 2020-09-25 NOTE — Progress Notes (Signed)
Marland Kitchen    HEMATOLOGY/ONCOLOGY CLINIC NOTE  Date of Service: .05/09/2018    Patient Care Team: Maurice Small, MD as PCP - General (Family Medicine)  CHIEF COMPLAINTS/PURPOSE OF CONSULTATION:  Iron deficiency Anemia  HISTORY OF PRESENTING ILLNESS:  Theresa Clay is a wonderful 65 y.o. female who has been referred to [place] by Dr Maurice Small, MD  for evaluation and management of Iron deficiency Anemia to consider IV Iron infusion.  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  Patient has recently noted significantly increased fatigue and exhaustion. She notes that she has developed food craving for boiled potatoes. Labs done with her primary care physician recently on 09/22/2016 showed a hemoglobin of 9.2 with a ferritin of 3.2 she notes no overt GI bleeding. Likely has absorption issues related to her Roux-en-Y gastric bypass surgery.   She was previously on by mouth iron replacement for the last 3 years but stopped it due to GI intolerance. Has been on daily multivitamin but no specific B12 or other B vitamin replacements at this time . Patient notes that she has had B12 deficiency in the past and was on B12 shots until 4 years ago. Has been on calcium and vitamin D.   Patient notes no overt GI bleeding no hematemesis no melena no hematochezia no hematuria no epistaxis or gum bleeding. Patient notes she last had a colonoscopy about 2 years ago and whether to have a benign polyp but no other issues with GI bleeding. Had an EGD in 2014 which was unrevealing.  Patient notes that she doesn't think she can tolerate oral iron replacement and really wants her iron replaced since she feels "lousy".  No acute weight loss. No other new focal symptoms.  INTERVAL HISTORY  Patient is here for f/u if her Iron deficiency  Anemia. The patient's last visit with [place] was on 03/28/2020. The pt reports that she is doing well overall.  The pt reports that she has been doing great. The pt notes that she has completed smoking cessation and has been taking all medicines as prescribed. The pt notes that she is having surgery on May 17 to remove the loose skin from her weight loss and doing a breast lift due to her hx of breast cancer.  Lab results today 09/26/2020 of CBC w/diff and CMP is as follows: all values are WNL except for Glucose of 137, Creatinine of 1.07, GFR est of 59. 09/26/2020 Vitamin B-12 of 446. 09/26/2020 Iron of 94, Sat Ratio of 28. 09/26/2020 Ferritin of 111.  On review of systems, pt denies any symptoms.  MEDICAL HISTORY:  Past Medical History:  Diagnosis Date  . Anxiety   . Bipolar 1 disorder ([place])   . Breast cancer ([place]) 12/13/07   left , ER/PR +  . Depression   . Esophageal reflux   . Fibromyalgia   . GERD (gastroesophageal reflux disease)   . Hx of radiation therapy 04/04/08-05/17/08   left breast, tumor bed  . Hyperlipemia   . 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 Hypothyroidism   . Osteoarthritis   . Personal history of radiation therapy 2009  History of PCOS status post hysterectomy with bilateral salpingo-oophorectomy 07/1996.     SURGICAL HISTORY: Past Surgical History:  Procedure Laterality Date  . BREAST BIOPSY Left 01/09/2008  . BREAST LUMPECTOMY Left 01/2008  . CHOLECYSTECTOMY    .  GASTRIC BYPASS    . KNEE ARTHROSCOPY Left 08/09/2012   Procedure: ARTHROSCOPY LEFT KNEE chondroplasty;  Surgeon: Hessie Dibble, MD;  Location: [facility];  Service: Orthopedics;  Laterality: Left;  . KNEE SURGERY  10/2011   reconstruction  . TONSILLECTOMY    . TOTAL ABDOMINAL HYSTERECTOMY W/ BILATERAL SALPINGOOPHORECTOMY      SOCIAL HISTORY: Social History   Socioeconomic History  . Marital status: Married    Spouse name: Not on file  . Number of children: 1  . Years of  education: college  . Highest education level: Not on file  Occupational History  . Occupation: disabled    Comment: for [employer]  Tobacco Use  . Smoking status: Current Every Day Smoker    Packs/day: 0.50    Years: 5.00    Pack years: 2.50  . Smokeless tobacco: Never Used  . Tobacco comment: 5-6 cigarettes daily  Vaping Use  . Vaping Use: Never used  Substance and Sexual Activity  . Alcohol use: No  . Drug use: No  . Sexual activity: Yes    Birth control/protection: Surgical  Other Topics Concern  . Not on file  Social History Narrative  . Not on file   Social Determinants of Health   Financial Resource Strain: Not on file  Food Insecurity: Not on file  Transportation Needs: Not on file  Physical Activity: Not on file  Stress: Not on file  Social Connections: Not on file  Intimate Partner Violence: Not on file    FAMILY HISTORY: Family History  Problem Relation Age of Onset  . Breast cancer Mother 54  . Melanoma Mother   . Dementia Father   . Cancer - Prostate Maternal Grandfather     ALLERGIES:  is allergic to darvocet [propoxyphene n-acetaminophen], penicillins, codeine, hydrocodone, oxycodone, and simvastatin.  MEDICATIONS:  Current Outpatient Medications  Medication Sig Dispense Refill  . amphetamine-dextroamphetamine (ADDERALL XR) 20 MG 24 hr capsule Take 20 mg by mouth 2 (two) times daily.    Marland Kitchen azithromycin (ZITHROMAX) 250 MG tablet Take 2 tablets PO on day one, and one tablet PO daily thereafter until completed. 6 tablet 0  . b complex vitamins capsule Take 1 capsule by mouth daily. 30 capsule 2  . benzonatate (TESSALON) 200 MG capsule Take 1 capsule (200 mg total) by mouth 3 (three) times daily as needed for cough. 30 capsule 0  . celecoxib (CELEBREX) 200 MG capsule Take 200 mg by mouth 2 (two) times daily.    . cyanocobalamin (,VITAMIN B-12,) 1000 MCG/ML injection 1000 mcg [place] injection every month 1 mL 11  . doxycycline (VIBRA-TABS) 100 MG tablet Take 1  tablet (100 mg total) by mouth 2 (two) times daily. 1 po bid 20 tablet 0  . escitalopram (LEXAPRO) 10 MG tablet Take 10 mg by mouth daily.    Marland Kitchen levothyroxine (SYNTHROID, LEVOTHROID) 100 MCG tablet Take 100 mcg by mouth at bedtime.    Marland Kitchen LIDODERM 5 % Place 1-3 patches onto the skin every 12 (twelve) hours as needed (pain).     Marland Kitchen linaclotide (LINZESS) 290 MCG CAPS capsule Take 290 mcg by mouth daily before breakfast.    . lithium carbonate 300 MG capsule Take 300-600 mg by mouth 2 (two) times daily. One tablet in am two tablets pm (Patient not taking: Reported on 05/16/2020)    . LORazepam (ATIVAN) 1 MG tablet Take 0.5-1 mg by mouth 2 (two) times daily. Take 1mg  at night and 0.5mg  in the morning    .  omeprazole (PRILOSEC) 40 MG capsule 40 mg daily.     . pentazocine-naloxone (TALWIN NX) 50-0.5 MG tablet Take 1 tablet by mouth as needed.    Marland Kitchen tiZANidine (ZANAFLEX) 4 MG capsule Take 4 mg by mouth daily. Take daily    . valACYclovir (VALTREX) 500 MG tablet Take 500 mg by mouth daily. One tablet every 12 hours as needed    . Vitamin D, Ergocalciferol, (DRISDOL) 1.25 MG (50000 UNIT) CAPS capsule TAKE 1 CAPSULE BY MOUTH 2 TIMES A WEEK. 24 capsule 1   No current facility-administered medications for this visit.    REVIEW OF SYSTEMS:   10 Point review of Systems was done is negative except as noted above.  PHYSICAL EXAMINATION: ECOG PERFORMANCE STATUS: 2 - Symptomatic, <50% confined to bed  . Vitals:   09/26/20 1202  BP: (!) 142/95  Pulse: 82  Resp: 18  Temp: 98.1 F (36.7 C)  SpO2: 100%   Filed Weights   09/26/20 1202  Weight: 145 lb (65.8 kg)   .Body mass index is 26.52 kg/m.    GENERAL:alert, in no acute distress and comfortable SKIN: no acute rashes, no significant lesions EYES: conjunctiva are pink and non-injected, sclera anicteric OROPHARYNX: MMM, no exudates, no oropharyngeal erythema or ulceration NECK: supple, no JVD LYMPH:  no palpable lymphadenopathy in the cervical,  axillary or inguinal regions LUNGS: clear to auscultation b/l with normal respiratory effort HEART: regular rate & rhythm ABDOMEN:  normoactive bowel sounds , non tender, not distended. Extremity: no pedal edema PSYCH: alert & oriented x 3 with fluent speech NEURO: no focal motor/sensory deficits  LABORATORY DATA:  I have reviewed the data as listed  . CBC Latest Ref Rng & Units 09/26/2020 03/28/2020 09/27/2019  WBC 4.0 - 10.5 K/uL 8.7 12.5(H) 9.6  Hemoglobin 12.0 - 15.0 g/dL 14.7 14.7 13.6  Hematocrit 36.0 - 46.0 % 45.8 46.6(H) 42.7  Platelets 150 - 400 K/uL 260 331 298   . CBC    Component Value Date/Time   WBC 8.7 09/26/2020 1051   RBC 4.85 09/26/2020 1051   HGB 14.7 09/26/2020 1051   HGB 14.7 03/28/2020 1322   HGB 15.0 12/29/2016 0920   HCT 45.8 09/26/2020 1051   HCT 48.0 (H) 12/29/2016 0920   PLT 260 09/26/2020 1051   PLT 331 03/28/2020 1322   PLT 290 12/29/2016 0920   MCV 94.4 09/26/2020 1051   MCV 89.9 12/29/2016 0920   MCH 30.3 09/26/2020 1051   MCHC 32.1 09/26/2020 1051   RDW 13.4 09/26/2020 1051   RDW 17.5 (H) 12/29/2016 0920   LYMPHSABS 1.6 09/26/2020 1051   LYMPHSABS 1.9 12/29/2016 0920   MONOABS 0.3 09/26/2020 1051   MONOABS 0.4 12/29/2016 0920   EOSABS 0.1 09/26/2020 1051   EOSABS 0.3 12/29/2016 0920   BASOSABS 0.0 09/26/2020 1051   BASOSABS 0.0 12/29/2016 0920     . CMP Latest Ref Rng & Units 09/26/2020 03/28/2020 09/27/2019  Glucose 70 - 99 mg/dL 137(H) 151(H) 104(H)  BUN 8 - 23 mg/dL 18 12 15   Creatinine 0.44 - 1.00 mg/dL 1.07(H) 1.09(H) 1.17(H)  Sodium 135 - 145 mmol/L 138 138 141  Potassium 3.5 - 5.1 mmol/L 4.6 4.0 4.1  Chloride 98 - 111 mmol/L 106 107 109  CO2 22 - 32 mmol/L 22 23 24   Calcium 8.9 - 10.3 mg/dL 9.6 9.6 9.6  Total Protein 6.5 - 8.1 g/dL 7.1 6.6 6.5  Total Bilirubin 0.3 - 1.2 mg/dL 0.4 0.3 0.3  Alkaline Phos 38 -  126 U/L 116 112 97  AST 15 - 41 U/L 17 11(L) 20  ALT 0 - 44 U/L 9 12 18    . Lab Results  Component Value Date    IRON 94 09/26/2020   TIBC 334 09/26/2020   IRONPCTSAT 28 09/26/2020   (Iron and TIBC)  Lab Results  Component Value Date   FERRITIN 111 09/26/2020       B12 ---133--->307   RADIOGRAPHIC STUDIES: I have personally reviewed the radiological images as listed and agreed with the findings in the report. No results found.  ASSESSMENT & PLAN:   65 y.o. female with  #1 history of severe iron deficiency anemia Likely due to poor iron absorption in the setting of chronic PPI therapy and history of Roux-en-Y gastric bypass surgery. No overt issues with GI bleeding. Absorption seems the primary issue since the patient also has coexistent B12 deficiency and vitamin D deficiency.  PLAN:  -Discussed pt labwork today, 09/26/2020; blood counts normal, ferritin and iron labs normal, B12 normal. Chemistries stable. -Continue Vitamin B12 injections.  -Advised pt that there may be a small risk of blood loss from surgery and therefore we will see her back again in 6 months. Will switch to annual visits pending no more surgeries due to stability and normality of labs. -Will see back in 6 months with labs.    #2 vitamin B12 deficiency. Likely related to poor absorption due to chronic PPI therapy and Roux-en-Y gastric bypass surgery. Antiparietal cell and anti-intrinsic factor antibodies negative -no evidence of pernicious anemia. No clinical evidence of gluten intolerance. B12 levels wnl @ about 300 PLAN: -continue [place] B12 1000 mcg q4weeks  #3 severe vitamin D deficiency  Likely due to poor absorption due to chronic PPI therapy and Roux-en-Y gastric bypass surgery. Plan -mx per PCP  #4 . Patient Active Problem List   Diagnosis Date Noted  . Iron deficiency anemia 10/08/2016  . Diverticula of small intestine 03/29/2014  . Diverticulitis of duodenum 03/29/2014  . 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 Breast cancer ([place])   . Hx of radiation therapy   . Bipolar 1 disorder ([place])   .  Fibromyalgia   . Osteoarthritis    -continue f/u with PCP for mx of other medical co-morbidities.   FOLLOW UP: RTC with Dr Irene Limbo with labs in 6 months   The total time spent in the appt was 20 minutes and more than 50% was on counseling and direct patient cares.  All of the patient's questions were answered with apparent satisfaction. The patient knows to call the clinic with any problems, questions or concerns.    Sullivan Lone MD [place] AAHIVMS [facility] Hematology/Oncology Physician [facility]  (Office):       [phone] (Work cell):  [phone] (Fax):           [phone]   I, Reinaldo Raddle, am acting as scribe for Dr. Sullivan Lone, MD.   .I have reviewed the above documentation for accuracy and completeness, and I agree with the above. Brunetta Genera MD

## 2020-09-26 ENCOUNTER — Inpatient Hospital Stay: Payer: 59 | Attending: Hematology

## 2020-09-26 ENCOUNTER — Inpatient Hospital Stay (HOSPITAL_BASED_OUTPATIENT_CLINIC_OR_DEPARTMENT_OTHER): Payer: 59 | Admitting: Hematology

## 2020-09-26 ENCOUNTER — Ambulatory Visit (INDEPENDENT_AMBULATORY_CARE_PROVIDER_SITE_OTHER): Payer: 59

## 2020-09-26 ENCOUNTER — Other Ambulatory Visit: Payer: Self-pay

## 2020-09-26 VITALS — BP 142/95 | HR 82 | Temp 98.1°F | Resp 18 | Ht 62.0 in | Wt 145.0 lb

## 2020-09-26 VITALS — BP 145/77 | HR 73 | Temp 98.3°F | Ht 64.0 in | Wt 138.0 lb

## 2020-09-26 DIAGNOSIS — F17210 Nicotine dependence, cigarettes, uncomplicated: Secondary | ICD-10-CM | POA: Diagnosis not present

## 2020-09-26 DIAGNOSIS — E538 Deficiency of other specified B group vitamins: Secondary | ICD-10-CM

## 2020-09-26 DIAGNOSIS — E559 Vitamin D deficiency, unspecified: Secondary | ICD-10-CM | POA: Diagnosis not present

## 2020-09-26 DIAGNOSIS — D509 Iron deficiency anemia, unspecified: Secondary | ICD-10-CM | POA: Diagnosis not present

## 2020-09-26 DIAGNOSIS — Z9884 Bariatric surgery status: Secondary | ICD-10-CM | POA: Diagnosis not present

## 2020-09-26 DIAGNOSIS — M793 Panniculitis, unspecified: Secondary | ICD-10-CM

## 2020-09-26 DIAGNOSIS — D500 Iron deficiency anemia secondary to blood loss (chronic): Secondary | ICD-10-CM

## 2020-09-26 LAB — CBC WITH DIFFERENTIAL/PLATELET
Abs Immature Granulocytes: 0.03 10*3/uL (ref 0.00–0.07)
Basophils Absolute: 0 10*3/uL (ref 0.0–0.1)
Basophils Relative: 1 %
Eosinophils Absolute: 0.1 10*3/uL (ref 0.0–0.5)
Eosinophils Relative: 1 %
HCT: 45.8 % (ref 36.0–46.0)
Hemoglobin: 14.7 g/dL (ref 12.0–15.0)
Immature Granulocytes: 0 %
Lymphocytes Relative: 18 %
Lymphs Abs: 1.6 10*3/uL (ref 0.7–4.0)
MCH: 30.3 pg (ref 26.0–34.0)
MCHC: 32.1 g/dL (ref 30.0–36.0)
MCV: 94.4 fL (ref 80.0–100.0)
Monocytes Absolute: 0.3 10*3/uL (ref 0.1–1.0)
Monocytes Relative: 4 %
Neutro Abs: 6.6 10*3/uL (ref 1.7–7.7)
Neutrophils Relative %: 76 %
Platelets: 260 10*3/uL (ref 150–400)
RBC: 4.85 MIL/uL (ref 3.87–5.11)
RDW: 13.4 % (ref 11.5–15.5)
WBC: 8.7 10*3/uL (ref 4.0–10.5)
nRBC: 0 % (ref 0.0–0.2)

## 2020-09-26 LAB — IRON AND TIBC
Iron: 94 ug/dL (ref 41–142)
Saturation Ratios: 28 % (ref 21–57)
TIBC: 334 ug/dL (ref 236–444)
UIBC: 240 ug/dL (ref 120–384)

## 2020-09-26 LAB — VITAMIN B12: Vitamin B-12: 446 pg/mL (ref 180–914)

## 2020-09-26 LAB — CMP (CANCER CENTER ONLY)
ALT: 9 U/L (ref 0–44)
AST: 17 U/L (ref 15–41)
Albumin: 4.1 g/dL (ref 3.5–5.0)
Alkaline Phosphatase: 116 U/L (ref 38–126)
Anion gap: 10 (ref 5–15)
BUN: 18 mg/dL (ref 8–23)
CO2: 22 mmol/L (ref 22–32)
Calcium: 9.6 mg/dL (ref 8.9–10.3)
Chloride: 106 mmol/L (ref 98–111)
Creatinine: 1.07 mg/dL — ABNORMAL HIGH (ref 0.44–1.00)
GFR, Estimated: 59 mL/min — ABNORMAL LOW (ref >60)
Glucose, Bld: 137 mg/dL — ABNORMAL HIGH (ref 70–99)
Potassium: 4.6 mmol/L (ref 3.5–5.1)
Sodium: 138 mmol/L (ref 135–145)
Total Bilirubin: 0.4 mg/dL (ref 0.3–1.2)
Total Protein: 7.1 g/dL (ref 6.5–8.1)

## 2020-09-26 LAB — FERRITIN: Ferritin: 111 ng/mL (ref 11–307)

## 2020-09-30 ENCOUNTER — Telehealth: Payer: Self-pay

## 2020-09-30 NOTE — Telephone Encounter (Signed)
09/26/20- pt is in office to pick up her order/request for nicotine test She will sign it & take it to [facility] with her now for the test to be performed.

## 2020-10-01 ENCOUNTER — Telehealth: Payer: Self-pay | Admitting: Hematology

## 2020-10-01 NOTE — Telephone Encounter (Signed)
Scheduled follow-up appointment per 4/28 los. Patient is aware. 

## 2020-10-03 LAB — NICOTINE/COTININE METABOLITES
Cotinine: <1 ng/mL
Nicotine: <1 ng/mL

## 2020-10-09 NOTE — Patient Instructions (Signed)
Pt is instructed to call for any further questions or concerns

## 2020-10-09 NOTE — Progress Notes (Addendum)
09/26/20     Patient ID: Theresa Clay, female    DOB: 05-Jun-1955, 65 y.o.   MRN: [id number]  No chief complaint on file.     ICD-10-CM   1. Panniculitis  M79.3 Nicotine/cotinine metabolites     History of Present Illness: Theresa Clay is a 65 y.o.  female  with a history of Panniculitis & Left Breast Cancer   She presents for preoperative evaluation for upcoming procedure: Abdominoplasty & right breast mastopexy  scheduled for 10/15/20 with Dr. Claudia Desanctis  The patient has not had problems with anesthesia.   Summary of Previous Visit:  She was seen for consultation for Panniculitis and for asymmetry of breast d/t left breast cancer & left breast biopsy & lumpectomy with radiation in 2009 She had Gastric Bypass surgery & weight loss of approximately 170# Job: n/a  PMH Significant for:  Hypothyroidism Hx of breast cancer Osteoarthritis Fibromyalgia Previous smoker- quit 07/30/20 BPD Diverticulitis     Past Medical History: Allergies: Allergies  Allergen Reactions  . Darvocet [Propoxyphene N-Acetaminophen] Anaphylaxis  . Penicillins Anaphylaxis  . Codeine Other (See Comments)    "double over in pain" per pt  . Hydrocodone Nausea And Vomiting    pain  . Oxycodone Nausea And Vomiting    pain  . Simvastatin     myalgias    Current Medications:  Current Outpatient Medications:  .  amphetamine-dextroamphetamine (ADDERALL XR) 20 MG 24 hr capsule, Take 20 mg by mouth 2 (two) times daily., Disp: , Rfl:  .  azithromycin (ZITHROMAX) 250 MG tablet, Take 2 tablets PO on day one, and one tablet PO daily thereafter until completed., Disp: 6 tablet, Rfl: 0 .  b complex vitamins capsule, Take 1 capsule by mouth daily., Disp: 30 capsule, Rfl: 2 .  benzonatate (TESSALON) 200 MG capsule, Take 1 capsule (200 mg total) by mouth 3 (three) times daily as needed for cough., Disp: 30 capsule, Rfl: 0 .  celecoxib (CELEBREX) 200 MG capsule, Take 200 mg by mouth 2 (two) times daily.,  Disp: , Rfl:  .  cyanocobalamin (,VITAMIN B-12,) 1000 MCG/ML injection, 1000 mcg [place] injection every month, Disp: 1 mL, Rfl: 11 .  doxycycline (VIBRA-TABS) 100 MG tablet, Take 1 tablet (100 mg total) by mouth 2 (two) times daily. 1 po bid, Disp: 20 tablet, Rfl: 0 .  escitalopram (LEXAPRO) 10 MG tablet, Take 10 mg by mouth daily., Disp: , Rfl:  .  levothyroxine (SYNTHROID, LEVOTHROID) 100 MCG tablet, Take 100 mcg by mouth at bedtime., Disp: , Rfl:  .  LIDODERM 5 %, Place 1-3 patches onto the skin every 12 (twelve) hours as needed (pain). , Disp: , Rfl:  .  linaclotide (LINZESS) 290 MCG CAPS capsule, Take 290 mcg by mouth daily before breakfast., Disp: , Rfl:  .  lithium carbonate 300 MG capsule, Take 300-600 mg by mouth 2 (two) times daily. One tablet in am two tablets pm (Patient not taking: Reported on 05/16/2020), Disp: , Rfl:  .  LORazepam (ATIVAN) 1 MG tablet, Take 0.5-1 mg by mouth 2 (two) times daily. Take 1mg  at night and 0.5mg  in the morning, Disp: , Rfl:  .  omeprazole (PRILOSEC) 40 MG capsule, 40 mg daily. , Disp: , Rfl:  .  pentazocine-naloxone (TALWIN NX) 50-0.5 MG tablet, Take 1 tablet by mouth as needed., Disp: , Rfl:  .  tiZANidine (ZANAFLEX) 4 MG capsule, Take 4 mg by mouth daily. Take daily, Disp: , Rfl:  .  valACYclovir (VALTREX) 500 MG  tablet, Take 500 mg by mouth daily. One tablet every 12 hours as needed, Disp: , Rfl:  .  Vitamin D, Ergocalciferol, (DRISDOL) 1.25 MG (50000 UNIT) CAPS capsule, TAKE 1 CAPSULE BY MOUTH 2 TIMES A WEEK., Disp: 24 capsule, Rfl: 1  Past Medical Problems: Past Medical History:  Diagnosis Date  . Anxiety   . Bipolar 1 disorder ([place])   . Breast cancer ([place]) 12/13/07   left , ER/PR +  . Depression   . Esophageal reflux   . Fibromyalgia   . GERD (gastroesophageal reflux disease)   . Hx of radiation therapy 04/04/08-05/17/08   left breast, tumor bed  . Hyperlipemia   . 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 Hypothyroidism   . Osteoarthritis   .  Personal history of radiation therapy 2009    Past Surgical History: Past Surgical History:  Procedure Laterality Date  . BREAST BIOPSY Left 01/09/2008  . BREAST LUMPECTOMY Left 01/2008  . CHOLECYSTECTOMY    . GASTRIC BYPASS    . KNEE ARTHROSCOPY Left 08/09/2012   Procedure: ARTHROSCOPY LEFT KNEE chondroplasty;  Surgeon: Hessie Dibble, MD;  Location: [facility];  Service: Orthopedics;  Laterality: Left;  . KNEE SURGERY  10/2011   reconstruction  . TONSILLECTOMY    . TOTAL ABDOMINAL HYSTERECTOMY W/ BILATERAL SALPINGOOPHORECTOMY      Social History: Social History   Socioeconomic History  . Marital status: Married    Spouse name: Not on file  . Number of children: 1  . Years of education: college  . Highest education level: Not on file  Occupational History  . Occupation: disabled    Comment: for [employer]  Tobacco Use  . Smoking status: Current Every Day Smoker    Packs/day: 0.50    Years: 5.00    Pack years: 2.50  . Smokeless tobacco: Never Used  . Tobacco comment: 5-6 cigarettes daily  Vaping Use  . Vaping Use: Never used  Substance and Sexual Activity  . Alcohol use: No  . Drug use: No  . Sexual activity: Yes    Birth control/protection: Surgical  Other Topics Concern  . Not on file  Social History Narrative  . Not on file   Social Determinants of Health   Financial Resource Strain: Not on file  Food Insecurity: Not on file  Transportation Needs: Not on file  Physical Activity: Not on file  Stress: Not on file  Social Connections: Not on file  Intimate Partner Violence: Not on file    Family History: Family History  Problem Relation Age of Onset  . Breast cancer Mother 51  . Melanoma Mother   . Dementia Father   . Cancer - Prostate Maternal Grandfather     Review of Systems: ROS  Denies any of the following: No asthma/oxygen use/no C-pap use No recent cardiac hx of MI,  EKG- 2015 = abnormal T-wave / no recent symptoms No  DVT/varicose veins & no feet/ankle or leg swelling   Physical Exam: Vital Signs BP (!) 145/77 (BP Location: Right Arm, Patient Position: Sitting, Cuff Size: Normal)   Pulse 73   Temp 98.3 F (36.8 C) (Temporal)   Ht 5\' 4"  (1.626 m)   Wt 138 lb (62.6 kg)   SpO2 98%   BMI 23.69 kg/m   Physical Exam Constitutional:      General: Not in acute distress.    Appearance: Normal appearance. Not ill-appearing.  HENT:     Head: Normocephalic and atraumatic.  Eyes:  Pupils: Pupils are equal, round Neck:     Musculoskeletal: Normal range of motion.  Cardiovascular:     Rate and Rhythm: Normal rate    Pulses: Normal pulses.  Pulmonary:     Effort: Pulmonary effort is normal. No respiratory distress.  Abdominal:     General: Abdomen is flat. There is no distension.  Musculoskeletal: Normal range of motion.  Skin:    General: Skin is warm and dry.     Findings: No erythema or rash.  Neurological:     General: No focal deficit present.     Mental Status: Alert and oriented to person, place, and time. Mental status is at baseline.     Motor: No weakness.  Psychiatric:        Mood and Affect: Mood normal.        Behavior: Behavior normal.    Assessment/Plan: The patient is scheduled for Abdominoplasty / right mastopexy with Dr. Claudia Desanctis.   Risks, benefits, and alternatives of procedure discussed, questions answered by myself & Dr. Claudia Desanctis and consent obtained.   copy of consent to be scanned into chart Smoking Status: not smoking ; Counseling Given?  N/A- Pt had Nicotine/continine test on 09/27/20 Willette [place] 10/03/20- negative for nicotine/continine Last Mammogram: 05/01/2020 /  Results: negative for malignancy  Caprini Score: = 7  Risk Factors: Hx breast CA, Hx of Diverticulitis and length of planned surgery.  Recommendation for mechanical = yes/compression stockings,  pharmacological prophylaxis. Encourage early ambulation.   Pictures obtained: at consultation  Post-op Rx sent to  pharmacy: by Dr. Claudia Desanctis  Patient was provided with the General Surgical Risk consent document and Pain Medication Agreement prior to their appointment.  They had adequate time to read through the risk consent documents and Pain Medication Agreement. We also discussed them in person together during this preop appointment. All of their questions were answered to their satisfaction.  Recommended calling if they have any further questions.  Risk consent form and Pain Medication Agreement to be scanned into patient's chart.    Electronically signed by: [place], RN 10/09/2020 3:39 PM

## 2020-10-11 DIAGNOSIS — F331 Major depressive disorder, recurrent, moderate: Secondary | ICD-10-CM | POA: Diagnosis not present

## 2020-10-15 ENCOUNTER — Other Ambulatory Visit: Payer: Self-pay | Admitting: Plastic Surgery

## 2020-10-15 DIAGNOSIS — N651 Disproportion of reconstructed breast: Secondary | ICD-10-CM | POA: Diagnosis not present

## 2020-10-15 DIAGNOSIS — Z411 Encounter for cosmetic surgery: Secondary | ICD-10-CM | POA: Diagnosis not present

## 2020-10-15 DIAGNOSIS — C50412 Malignant neoplasm of upper-outer quadrant of left female breast: Secondary | ICD-10-CM | POA: Diagnosis not present

## 2020-10-15 DIAGNOSIS — Z853 Personal history of malignant neoplasm of breast: Secondary | ICD-10-CM | POA: Diagnosis not present

## 2020-10-15 MED ORDER — ONDANSETRON HCL 4 MG PO TABS: 4.0000 mg | ORAL_TABLET | Freq: Three times a day (TID) | ORAL | 0 refills | Status: AC | PRN

## 2020-10-15 MED ORDER — HYDROCODONE-ACETAMINOPHEN 7.5-325 MG PO TABS: 1.0000 | ORAL_TABLET | Freq: Three times a day (TID) | ORAL | 0 refills | Status: AC | PRN

## 2020-10-15 MED ORDER — CEPHALEXIN 500 MG PO CAPS: 500.0000 mg | ORAL_CAPSULE | Freq: Four times a day (QID) | ORAL | 0 refills | Status: AC

## 2020-10-24 ENCOUNTER — Ambulatory Visit (INDEPENDENT_AMBULATORY_CARE_PROVIDER_SITE_OTHER): Payer: 59 | Admitting: Plastic Surgery

## 2020-10-24 ENCOUNTER — Other Ambulatory Visit: Payer: Self-pay

## 2020-10-24 DIAGNOSIS — Z411 Encounter for cosmetic surgery: Secondary | ICD-10-CM

## 2020-10-24 NOTE — Progress Notes (Signed)
Patient is here 1 week postop from abdominoplasty and right mastopexy.  Overall she feels like things are going really well.  On exam of the mastopexy looks to be healing well with viable nipple areolar complex.  Symmetry is much improved.  Abdominoplasty looks to be healing well.  Umbilicus looks viable.  No signs of any skin breakdown or subcutaneous fluid.  Drain has been putting out around 30 cc or less per day for the past several days so it was removed.  We will plan to have her continue to wear compressive garments and avoid strenuous activity and see her again in a few weeks.  All of her questions were answered.

## 2020-10-29 ENCOUNTER — Telehealth: Payer: Self-pay | Admitting: Plastic Surgery

## 2020-10-29 NOTE — Telephone Encounter (Signed)
Patient called to find out when she can take her covid booster since she had surgery on 10/15/2020. She would like to know how long she needs to wait. Please call to advise

## 2020-10-30 HISTORY — PX: OTHER SURGICAL HISTORY: SHX169

## 2020-10-30 NOTE — Telephone Encounter (Signed)
Called and spoke with the patient regarding the message below.  Informed the patient anytime should be fine to get the covid booster, as long as she's not having any breast imaging.  Which she will need to wait 6 weeks after.  Patient verbalized understanding and agreed.//AB/CMA

## 2020-10-31 ENCOUNTER — Telehealth: Payer: Self-pay

## 2020-10-31 DIAGNOSIS — Z23 Encounter for immunization: Secondary | ICD-10-CM | POA: Diagnosis not present

## 2020-10-31 NOTE — Telephone Encounter (Signed)
Returned patients call. Abdominoplasty and right breast mastopexy surgery was performed on 10/15/20 . Advised to wait until incisions are closed with no openings before she can start the scar cream. Dr. Claudia Desanctis will access her incisions and healing process at the next follow up appointment 11/14/20. Patient understood and agreed with plan.

## 2020-10-31 NOTE — Telephone Encounter (Signed)
Patient called to find out when she can start using scar cream.  She said that her steri strips are off in most places and she is still seeing blood where the skin is together.  Please call.

## 2020-11-14 ENCOUNTER — Other Ambulatory Visit: Payer: Self-pay

## 2020-11-14 ENCOUNTER — Ambulatory Visit (INDEPENDENT_AMBULATORY_CARE_PROVIDER_SITE_OTHER): Payer: 59 | Admitting: Plastic Surgery

## 2020-11-14 DIAGNOSIS — Z411 Encounter for cosmetic surgery: Secondary | ICD-10-CM

## 2020-11-14 NOTE — Progress Notes (Signed)
Patient presents 1 month out from abdominoplasty in conjunction with a right sided mastopexy.  She is overall happy with her results.  On exam all of her incisions of healed nicely.  She has good shape and contour and symmetry to her breast.  The abdominal incision looks to be healing uneventfully.  She does have some small dogears laterally that we have asked her to massage and hopefully these will smooth out with time.  If not a local revision in the office may be necessary.  She is getting ready to go to [place] and we will plan to see her after she gets back from that trip about 6 weeks from now.  All her questions were answered.

## 2020-11-19 ENCOUNTER — Other Ambulatory Visit: Payer: Self-pay | Admitting: Hematology

## 2020-11-21 DIAGNOSIS — F3341 Major depressive disorder, recurrent, in partial remission: Secondary | ICD-10-CM | POA: Diagnosis not present

## 2020-11-25 ENCOUNTER — Telehealth: Payer: Self-pay | Admitting: Plastic Surgery

## 2020-11-25 NOTE — Telephone Encounter (Signed)
Patient called about exposed stitches from sx 5/17. Please call to advise [phone]. Thanks.

## 2020-11-25 NOTE — Telephone Encounter (Signed)
Returned patients call. She is currently in [place] visiting her sister, and has two stitches remaining from her breast lift surgery on 10/15/20 with Dr. Claudia Desanctis that is causing irritation from her bra. Patient inquired if she, or sister can clip them. Advised they sutures are dissolvable and will fall out in time. However, if the sister feels comfortable, they can be clipped carefully with small scissors. If not, can cover with gauze to help with the skin irritation until the sutures fall out. Patient is okay with sister removing removing sutures.

## 2021-01-07 DIAGNOSIS — F3341 Major depressive disorder, recurrent, in partial remission: Secondary | ICD-10-CM | POA: Diagnosis not present

## 2021-01-08 ENCOUNTER — Other Ambulatory Visit: Payer: Self-pay

## 2021-01-08 ENCOUNTER — Ambulatory Visit (INDEPENDENT_AMBULATORY_CARE_PROVIDER_SITE_OTHER): Payer: 59

## 2021-01-08 VITALS — BP 145/77 | Temp 98.2°F | Ht 64.0 in | Wt 138.0 lb

## 2021-01-08 DIAGNOSIS — Z411 Encounter for cosmetic surgery: Secondary | ICD-10-CM

## 2021-01-08 NOTE — Progress Notes (Signed)
Patient in office today for the following:   She reports that she has noticed a "stitch" that is protruding thru the right breast incision. S/P abdominoplasty & right breast mastopexy by Dr. Claudia Desanctis on 10/15/20. On exam- all her incisions are healed and doing well. There was a suture remnant protruding from the incision/scar at the vertical/inframammary fold junction.  There was slight redness/irritation at the area- but no noted opening or any other sign of complication.  There is no drainage & only slight tenderness when the suture "caught" on her bra.  She has been using a bandaid on the area. I was able to remove the suture & applied vaseline & bandaid to the site for comfort. Abdominal incisions have healed completely & her only concern is about the bilateral "dog-ears" laterally. She has been massaging/manipulating these & reports that both have Improved- but right is more prominent than left. She does want to discuss these with Dr. Claudia Desanctis on her next f/u 01/16/21. I reminded her to call for any other concerns.

## 2021-01-08 NOTE — Patient Instructions (Signed)
Call for any concerns

## 2021-01-16 ENCOUNTER — Ambulatory Visit: Payer: 59 | Admitting: Plastic Surgery

## 2021-01-29 ENCOUNTER — Ambulatory Visit (INDEPENDENT_AMBULATORY_CARE_PROVIDER_SITE_OTHER): Payer: 59 | Admitting: Plastic Surgery

## 2021-01-29 ENCOUNTER — Other Ambulatory Visit: Payer: Self-pay

## 2021-01-29 DIAGNOSIS — Z411 Encounter for cosmetic surgery: Secondary | ICD-10-CM

## 2021-01-29 NOTE — Progress Notes (Signed)
Patient presents about 3 months postop from right mastopexy and abdominoplasty.  Overall she is happy.  She points out some small dogears on the right side of the abdominal incisions.  She does feel like some of her abdominal laxity has recurred in terms of the skin.  On exam she does have a bit of upper abdominal laxity.  She has a good contour inferior to the umbilicus.  Otherwise she is healed nicely.  I explained in order to remove the remaining skin redundancy she would probably need a fleur-de-lis added.  She does not seem to be bothered by the scar and she is going to think about this option.

## 2021-02-12 DIAGNOSIS — F3341 Major depressive disorder, recurrent, in partial remission: Secondary | ICD-10-CM | POA: Diagnosis not present

## 2021-03-27 ENCOUNTER — Other Ambulatory Visit: Payer: Self-pay

## 2021-03-27 DIAGNOSIS — D500 Iron deficiency anemia secondary to blood loss (chronic): Secondary | ICD-10-CM

## 2021-03-28 ENCOUNTER — Inpatient Hospital Stay: Payer: 59

## 2021-03-28 ENCOUNTER — Other Ambulatory Visit: Payer: Self-pay

## 2021-03-28 ENCOUNTER — Inpatient Hospital Stay: Payer: 59 | Attending: Hematology | Admitting: Hematology

## 2021-03-28 VITALS — BP 142/82 | HR 98 | Temp 98.1°F | Resp 18 | Ht 64.0 in | Wt 138.1 lb

## 2021-03-28 DIAGNOSIS — Z818 Family history of other mental and behavioral disorders: Secondary | ICD-10-CM | POA: Diagnosis not present

## 2021-03-28 DIAGNOSIS — Z9884 Bariatric surgery status: Secondary | ICD-10-CM | POA: Insufficient documentation

## 2021-03-28 DIAGNOSIS — E039 Hypothyroidism, unspecified: Secondary | ICD-10-CM | POA: Diagnosis not present

## 2021-03-28 DIAGNOSIS — Z8042 Family history of malignant neoplasm of prostate: Secondary | ICD-10-CM | POA: Diagnosis not present

## 2021-03-28 DIAGNOSIS — Z885 Allergy status to narcotic agent status: Secondary | ICD-10-CM | POA: Diagnosis not present

## 2021-03-28 DIAGNOSIS — Z79899 Other long term (current) drug therapy: Secondary | ICD-10-CM | POA: Diagnosis not present

## 2021-03-28 DIAGNOSIS — D500 Iron deficiency anemia secondary to blood loss (chronic): Secondary | ICD-10-CM | POA: Diagnosis not present

## 2021-03-28 DIAGNOSIS — E538 Deficiency of other specified B group vitamins: Secondary | ICD-10-CM | POA: Diagnosis not present

## 2021-03-28 DIAGNOSIS — E785 Hyperlipidemia, unspecified: Secondary | ICD-10-CM | POA: Insufficient documentation

## 2021-03-28 DIAGNOSIS — Z90722 Acquired absence of ovaries, bilateral: Secondary | ICD-10-CM | POA: Insufficient documentation

## 2021-03-28 DIAGNOSIS — Z9049 Acquired absence of other specified parts of digestive tract: Secondary | ICD-10-CM | POA: Diagnosis not present

## 2021-03-28 DIAGNOSIS — Z803 Family history of malignant neoplasm of breast: Secondary | ICD-10-CM | POA: Diagnosis not present

## 2021-03-28 DIAGNOSIS — Z923 Personal history of irradiation: Secondary | ICD-10-CM | POA: Insufficient documentation

## 2021-03-28 DIAGNOSIS — Z880 Allergy status to penicillin: Secondary | ICD-10-CM | POA: Diagnosis not present

## 2021-03-28 DIAGNOSIS — Z853 Personal history of malignant neoplasm of breast: Secondary | ICD-10-CM | POA: Diagnosis not present

## 2021-03-28 DIAGNOSIS — D509 Iron deficiency anemia, unspecified: Secondary | ICD-10-CM | POA: Diagnosis not present

## 2021-03-28 DIAGNOSIS — E559 Vitamin D deficiency, unspecified: Secondary | ICD-10-CM | POA: Diagnosis not present

## 2021-03-28 DIAGNOSIS — R5383 Other fatigue: Secondary | ICD-10-CM | POA: Insufficient documentation

## 2021-03-28 DIAGNOSIS — Z7952 Long term (current) use of systemic steroids: Secondary | ICD-10-CM | POA: Diagnosis not present

## 2021-03-28 DIAGNOSIS — Z808 Family history of malignant neoplasm of other organs or systems: Secondary | ICD-10-CM | POA: Diagnosis not present

## 2021-03-28 LAB — CBC WITH DIFFERENTIAL (CANCER CENTER ONLY)
Abs Immature Granulocytes: 0.02 10*3/uL (ref 0.00–0.07)
Basophils Absolute: 0.1 10*3/uL (ref 0.0–0.1)
Basophils Relative: 1 %
Eosinophils Absolute: 0.2 10*3/uL (ref 0.0–0.5)
Eosinophils Relative: 2 %
HCT: 45.3 % (ref 36.0–46.0)
Hemoglobin: 14.8 g/dL (ref 12.0–15.0)
Immature Granulocytes: 0 %
Lymphocytes Relative: 24 %
Lymphs Abs: 1.8 10*3/uL (ref 0.7–4.0)
MCH: 29 pg (ref 26.0–34.0)
MCHC: 32.7 g/dL (ref 30.0–36.0)
MCV: 88.6 fL (ref 80.0–100.0)
Monocytes Absolute: 0.3 10*3/uL (ref 0.1–1.0)
Monocytes Relative: 4 %
Neutro Abs: 5.2 10*3/uL (ref 1.7–7.7)
Neutrophils Relative %: 69 %
Platelet Count: 273 10*3/uL (ref 150–400)
RBC: 5.11 MIL/uL (ref 3.87–5.11)
RDW: 14.2 % (ref 11.5–15.5)
WBC Count: 7.6 10*3/uL (ref 4.0–10.5)
nRBC: 0 % (ref 0.0–0.2)

## 2021-03-28 LAB — FERRITIN: Ferritin: 43 ng/mL (ref 11–307)

## 2021-03-28 LAB — IRON AND TIBC
Iron: 110 ug/dL (ref 41–142)
Saturation Ratios: 32 % (ref 21–57)
TIBC: 339 ug/dL (ref 236–444)
UIBC: 229 ug/dL (ref 120–384)

## 2021-03-28 LAB — VITAMIN B12: Vitamin B-12: 372 pg/mL (ref 180–914)

## 2021-03-28 LAB — CMP (CANCER CENTER ONLY)
ALT: 9 U/L (ref 0–44)
AST: 15 U/L (ref 15–41)
Albumin: 4.1 g/dL (ref 3.5–5.0)
Alkaline Phosphatase: 116 U/L (ref 38–126)
Anion gap: 9 (ref 5–15)
BUN: 12 mg/dL (ref 8–23)
CO2: 21 mmol/L — ABNORMAL LOW (ref 22–32)
Calcium: 9.8 mg/dL (ref 8.9–10.3)
Chloride: 109 mmol/L (ref 98–111)
Creatinine: 1.06 mg/dL — ABNORMAL HIGH (ref 0.44–1.00)
GFR, Estimated: 59 mL/min — ABNORMAL LOW (ref >60)
Glucose, Bld: 126 mg/dL — ABNORMAL HIGH (ref 70–99)
Potassium: 4.2 mmol/L (ref 3.5–5.1)
Sodium: 139 mmol/L (ref 135–145)
Total Bilirubin: 0.4 mg/dL (ref 0.3–1.2)
Total Protein: 6.9 g/dL (ref 6.5–8.1)

## 2021-03-31 ENCOUNTER — Telehealth: Payer: Self-pay | Admitting: Hematology

## 2021-03-31 NOTE — Telephone Encounter (Signed)
Scheduled follow-up appointment per 10/28 los. Patient is aware. 

## 2021-04-03 ENCOUNTER — Encounter: Payer: Self-pay | Admitting: Hematology
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
## 2021-04-04 ENCOUNTER — Telehealth: Payer: Self-pay

## 2021-04-04 NOTE — Telephone Encounter (Signed)
Contacted pt per Dr Irene Limbo: Informed pt per Dr Irene Limbo Ferritin down to 43. Offered option for non urgent consideration of 1 dose of Injectafer to build adequate stores for Iron for the next year. Pt agreed and schedule message sent.

## 2021-04-08 ENCOUNTER — Other Ambulatory Visit: Payer: Self-pay | Admitting: Hematology

## 2021-04-09 ENCOUNTER — Inpatient Hospital Stay: Payer: 59 | Attending: Hematology

## 2021-04-09 ENCOUNTER — Other Ambulatory Visit: Payer: Self-pay

## 2021-04-09 VITALS — BP 121/86 | HR 79 | Temp 98.9°F | Resp 16

## 2021-04-09 DIAGNOSIS — D509 Iron deficiency anemia, unspecified: Secondary | ICD-10-CM | POA: Insufficient documentation

## 2021-04-09 DIAGNOSIS — D508 Other iron deficiency anemias: Secondary | ICD-10-CM

## 2021-04-09 MED ORDER — SODIUM CHLORIDE 0.9 % IV SOLN
300.0000 mg | Freq: Once | INTRAVENOUS | Status: AC
  Administered 2021-04-09: 300 mg via INTRAVENOUS
  Filled 2021-04-09: qty 300

## 2021-04-09 MED ORDER — SODIUM CHLORIDE 0.9 % IV SOLN: Freq: Once | INTRAVENOUS | Status: AC

## 2021-04-09 MED ORDER — LORATADINE 10 MG PO TABS
10.0000 mg | ORAL_TABLET | Freq: Every day | ORAL | Status: DC
  Administered 2021-04-09: 10 mg via ORAL
  Filled 2021-04-09: qty 1

## 2021-04-09 NOTE — Patient Instructions (Signed)

## 2021-04-09 NOTE — Progress Notes (Signed)
Pt declined to stay for 30 mins post infusion for observation. Vitals WNL. No s/s of distress or reaction.

## 2021-04-30 ENCOUNTER — Other Ambulatory Visit: Payer: Self-pay

## 2021-04-30 ENCOUNTER — Ambulatory Visit (INDEPENDENT_AMBULATORY_CARE_PROVIDER_SITE_OTHER): Payer: 59 | Admitting: Plastic Surgery

## 2021-04-30 DIAGNOSIS — Z411 Encounter for cosmetic surgery: Secondary | ICD-10-CM
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
## 2021-05-06 ENCOUNTER — Other Ambulatory Visit: Payer: Self-pay | Admitting: Family Medicine

## 2021-05-06 DIAGNOSIS — Z1231 Encounter for screening mammogram for malignant neoplasm of breast: Secondary | ICD-10-CM

## 2021-06-02 ENCOUNTER — Telehealth: Payer: 59 | Admitting: Physician Assistant

## 2021-06-02 DIAGNOSIS — H9203 Otalgia, bilateral: Secondary | ICD-10-CM

## 2021-06-02 DIAGNOSIS — Z8669 Personal history of other diseases of the nervous system and sense organs: Secondary | ICD-10-CM | POA: Diagnosis not present

## 2021-06-02 MED ORDER — AZITHROMYCIN 250 MG PO TABS
ORAL_TABLET | ORAL | 0 refills | Status: AC
Start: 2021-06-02 — End: 2021-06-07

## 2021-06-02 NOTE — Progress Notes (Signed)
Giving your history and development of borderline fever I will go ahead and treat for a suspected bilateral ear infection. Since you are allergic to penicillins, I am sending in a script of Azithromycin for you to take as directed. Can use Tylenol if needed for mild pain. If symptoms are not improving/resolving with this treatment, you will need to be evaluated in person.

## 2021-06-02 NOTE — Progress Notes (Signed)
I have spent 5 minutes in review of e-visit questionnaire, review and updating patient chart, medical decision making and response to patient.   Annais Crafts Cody Knute Mazzuca, PA-C    

## 2021-06-02 NOTE — Progress Notes (Signed)
Based on the information that you have shared in the e-Visit Questionnaire, we recommend that you convert this visit to a video visit in order for the provider to better assess what is going on.  The provider will be able to give you a more accurate diagnosis and treatment plan if we can more freely discuss your symptoms and with the addition of a virtual examination.   This will help [place] determine between infection and other causes of current symptoms like Eustachian tube dysfunction, which are treated differently.  If you convert to a video visit, we will bill your insurance (similar to an office visit) and you will not be charged for this e-Visit. You will be able to stay at home and speak with the first available [facility] advanced practice provider. The link to do a video visit is in the drop down Menu tab of your Welcome screen in [place].

## 2021-06-11 ENCOUNTER — Ambulatory Visit
Admission: RE | Admit: 2021-06-11 | Discharge: 2021-06-11 | Disposition: A | Discharge status: Home / Self Care | Payer: 59 | Source: Ambulatory Visit | Attending: Family Medicine | Admitting: Family Medicine

## 2021-06-11 DIAGNOSIS — Z1231 Encounter for screening mammogram for malignant neoplasm of breast: Secondary | ICD-10-CM

## 2021-06-17 DIAGNOSIS — D225 Melanocytic nevi of trunk: Secondary | ICD-10-CM | POA: Diagnosis not present

## 2021-06-17 DIAGNOSIS — L814 Other melanin hyperpigmentation: Secondary | ICD-10-CM | POA: Diagnosis not present

## 2021-06-17 DIAGNOSIS — L821 Other seborrheic keratosis: Secondary | ICD-10-CM | POA: Diagnosis not present

## 2021-06-17 DIAGNOSIS — D485 Neoplasm of uncertain behavior of skin: Secondary | ICD-10-CM | POA: Diagnosis not present

## 2021-06-17 DIAGNOSIS — D2271 Melanocytic nevi of right lower limb, including hip: Secondary | ICD-10-CM | POA: Diagnosis not present

## 2021-06-17 DIAGNOSIS — L570 Actinic keratosis: Secondary | ICD-10-CM | POA: Diagnosis not present

## 2021-06-23 ENCOUNTER — Other Ambulatory Visit: Payer: Self-pay | Admitting: Surgical

## 2021-06-23 DIAGNOSIS — Z720 Tobacco use: Secondary | ICD-10-CM

## 2021-06-24 ENCOUNTER — Other Ambulatory Visit: Payer: Self-pay

## 2021-06-24 ENCOUNTER — Ambulatory Visit (INDEPENDENT_AMBULATORY_CARE_PROVIDER_SITE_OTHER): Payer: Self-pay | Admitting: Surgical

## 2021-06-24 ENCOUNTER — Encounter: Payer: Self-pay | Admitting: Surgical

## 2021-06-24 VITALS — BP 165/84 | HR 100 | Ht 62.0 in | Wt 138.0 lb

## 2021-06-24 DIAGNOSIS — M793 Panniculitis, unspecified: Secondary | ICD-10-CM

## 2021-06-24 DIAGNOSIS — Z720 Tobacco use: Secondary | ICD-10-CM

## 2021-06-24 DIAGNOSIS — Z411 Encounter for cosmetic surgery: Secondary | ICD-10-CM

## 2021-06-24 MED ORDER — ONDANSETRON HCL 4 MG PO TABS: 4.0000 mg | ORAL_TABLET | Freq: Three times a day (TID) | ORAL | 0 refills | Status: DC | PRN

## 2021-06-24 MED ORDER — HYDROCODONE-ACETAMINOPHEN 5-325 MG PO TABS: 1.0000 | ORAL_TABLET | Freq: Four times a day (QID) | ORAL | 0 refills | Status: AC | PRN

## 2021-06-24 NOTE — Progress Notes (Signed)
Patient ID: Theresa Clay, female    DOB: 05-13-1956, 66 y.o.   MRN: [id number]  Chief Complaint  Patient presents with   Pre-op Exam      ICD-10-CM   1. Tobacco use  Z72.0     2. Encounter for cosmetic procedure  Z41.1     3. Panniculitis  M79.3        History of Present Illness: Theresa Clay is a 66 y.o.  female  with a history of abdominoplasty.  She presents for preoperative evaluation for upcoming procedure, revision abdominoplasty, scheduled for 07/25/2021 with Dr. Claudia Desanctis.  The patient has not had problems with anesthesia. No history of DVT/PE.  No family history of DVT/PE.  No family or personal history of bleeding or clotting disorders.  Patient is not currently taking any blood thinners.  No history of CVA/MI.   PMH Significant for: Anxiety, bipolar 1 disorder, breast cancer (left), thyroid disorder, hyperlipidemia, GERD, fibromyalgia.  History of gastric bypass surgery with approximate 170 pound weight loss.  She is a smoker, she previously quit prior to her abdominoplasty but subsequently restarted due to stressful life events. She is aware she will need a nicotine test prior to surgery.  Patient reports she is overall feeling well, she reports that she is not doing this for aesthetic reasons and that she is doing this to improve her back pain.  She reports that she is most bothered by the excess skin along the lateral portion of her abdomen.  She feels as if when she lays flat she has a good flat result, but when she is standing she feels as if the side excess tissue falls forward.  Past Medical History: Allergies: Allergies  Allergen Reactions   Darvocet [Propoxyphene N-Acetaminophen] Anaphylaxis   Penicillins Anaphylaxis   Codeine Other (See Comments)    "double over in pain" per pt   Hydrocodone Nausea And Vomiting    pain   Oxycodone Nausea And Vomiting    pain   Simvastatin     myalgias    Current Medications:  Current Outpatient  Medications:    amphetamine-dextroamphetamine (ADDERALL XR) 20 MG 24 hr capsule, Take 20 mg by mouth 2 (two) times daily., Disp: , Rfl:    celecoxib (CELEBREX) 200 MG capsule, Take 200 mg by mouth daily., Disp: , Rfl:    cyanocobalamin (,VITAMIN B-12,) 1000 MCG/ML injection, INJECT 1 ML SUBCUTANEOUSLY EVERY MONTH, Disp: 1 mL, Rfl: 11   escitalopram (LEXAPRO) 10 MG tablet, Take 10 mg by mouth daily., Disp: , Rfl:    HYDROcodone-acetaminophen (NORCO) 5-325 MG tablet, Take 1 tablet by mouth every 6 (six) hours as needed for up to 5 days for severe pain., Disp: 20 tablet, Rfl: 0   levothyroxine (SYNTHROID) 100 MCG tablet, Take 1 tablet by mouth daily before breakfast., Disp: , Rfl:    LIDODERM 5 %, Place 1-3 patches onto the skin every 12 (twelve) hours as needed (pain)., Disp: , Rfl:    linaclotide (LINZESS) 290 MCG CAPS capsule, Take 290 mcg by mouth daily before breakfast., Disp: , Rfl:    LORazepam (ATIVAN) 1 MG tablet, Take 0.5-1 mg by mouth 2 (two) times daily. Take 1mg  at night and 0.5mg  in the morning, Disp: , Rfl:    omeprazole (PRILOSEC) 40 MG capsule, 40 mg daily., Disp: , Rfl:    ondansetron (ZOFRAN) 4 MG tablet, Take 1 tablet (4 mg total) by mouth every 8 (eight) hours as needed for nausea or vomiting., Disp:  20 tablet, Rfl: 0   pentazocine-naloxone (TALWIN NX) 50-0.5 MG tablet, Take 1 tablet by mouth as needed., Disp: , Rfl:    PREVIDENT 5000 BOOSTER PLUS 1.1 % PSTE, See admin instructions., Disp: , Rfl:    promethazine-codeine (PHENERGAN WITH CODEINE) 6.25-10 MG/5ML syrup, 5 ml as needed, Disp: , Rfl:    tiZANidine (ZANAFLEX) 4 MG tablet, Take 4 mg by mouth once., Disp: , Rfl:    traZODone (DESYREL) 50 MG tablet, Take 50 mg by mouth at bedtime., Disp: , Rfl:    varenicline (CHANTIX) 0.5 MG tablet, Take by mouth., Disp: , Rfl:    benzonatate (TESSALON) 200 MG capsule, Take 1 capsule (200 mg total) by mouth 3 (three) times daily as needed for cough., Disp: 30 capsule, Rfl: 0    ciprofloxacin (CIPRO) 500 MG tablet, Take 500 mg by mouth 2 (two) times daily as needed., Disp: , Rfl:    Diclofenac Sodium 3 % GEL, APPLY 4GRAMS TO AFFECTED AREA(S) TWO TIMES A DAY, Disp: , Rfl:    diphenhydrAMINE (BENADRYL) 25 MG tablet, 1 tablet at bedtime as needed (Patient not taking: Reported on 06/24/2021), Disp: , Rfl:    doxycycline (VIBRA-TABS) 100 MG tablet, Take 1 tablet (100 mg total) by mouth 2 (two) times daily. 1 po bid (Patient taking differently: Take 100 mg by mouth 2 (two) times daily as needed. 1 po bid), Disp: 20 tablet, Rfl: 0   fluconazole (DIFLUCAN) 150 MG tablet, Take 150 mg by mouth as needed., Disp: , Rfl:    HYDROcodone-acetaminophen (NORCO/VICODIN) 5-325 MG tablet, as needed., Disp: , Rfl:    LORazepam (ATIVAN) 0.5 MG tablet, Take 0.5 mg by mouth 2 (two) times daily., Disp: , Rfl:    mupirocin ointment (BACTROBAN) 2 %, Apply topically 3 (three) times daily., Disp: , Rfl:    nystatin-triamcinolone (MYCOLOG II) cream, 1 application, Disp: , Rfl:    predniSONE (DELTASONE) 20 MG tablet, 2 tablets, Disp: , Rfl:    triamcinolone cream (KENALOG) 0.1 %, Apply topically 2 (two) times a week., Disp: , Rfl:    valACYclovir (VALTREX) 1000 MG tablet, Take 1,000 mg by mouth 2 (two) times daily., Disp: , Rfl:    Vitamin D, Ergocalciferol, (DRISDOL) 1.25 MG (50000 UNIT) CAPS capsule, TAKE 1 CAPSULE BY MOUTH 2 TIMES A WEEK., Disp: 24 capsule, Rfl: 1  Past Medical Problems: Past Medical History:  Diagnosis Date   Anxiety    Bipolar 1 disorder ([place])    Breast cancer ([place]) 12/13/07   left , ER/PR +   Depression    Esophageal reflux    Fibromyalgia    GERD (gastroesophageal reflux disease)    Hx of radiation therapy 04/04/08-05/17/08   left breast, tumor bed   Hyperlipemia    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[facility])    Hypothyroidism    Osteoarthritis    Personal history of radiation therapy 2009    Past Surgical History: Past Surgical History:  Procedure Laterality Date   BREAST  BIOPSY Left 01/09/2008   BREAST LIFT Right 10/2020   BREAST LUMPECTOMY Left 01/2008   CHOLECYSTECTOMY     GASTRIC BYPASS     KNEE ARTHROSCOPY Left 08/09/2012   Procedure: ARTHROSCOPY LEFT KNEE chondroplasty;  Surgeon: Hessie Dibble, MD;  Location: [place];  Service: Orthopedics;  Laterality: Left;   KNEE SURGERY  10/2011   reconstruction   TONSILLECTOMY     TOTAL ABDOMINAL HYSTERECTOMY W/ BILATERAL SALPINGOOPHORECTOMY      Social History: Social History  Socioeconomic History   Marital status: Married    Spouse name: Not on file   Number of children: 1   Years of education: college   Highest education level: Not on file  Occupational History   Occupation: disabled    Comment: for [employer]  Tobacco Use   Smoking status: Former    Packs/day: 0.50    Years: 5.00    Pack years: 2.50    Types: Cigarettes    Quit date: 07/30/2020    Years since quitting: 0.9   Smokeless tobacco: Never   Tobacco comments:    5-6 cigarettes daily  Vaping Use   Vaping Use: Never used  Substance and Sexual Activity   Alcohol use: No   Drug use: No   Sexual activity: Yes    Birth control/protection: Surgical  Other Topics Concern   Not on file  Social History Narrative   Not on file   Social Determinants of Health   Financial Resource Strain: Not on file  Food Insecurity: Not on file  Transportation Needs: Not on file  Physical Activity: Not on file  Stress: Not on file  Social Connections: Not on file  Intimate Partner Violence: Not on file    Family History: Family History  Problem Relation Age of Onset   Breast cancer Mother 19   Melanoma Mother    Dementia Father    Cancer - Prostate Maternal Grandfather     Review of Systems: Review of Systems  Constitutional: Negative.   Respiratory: Negative.    Cardiovascular: Negative.   Musculoskeletal:  Positive for back pain.   Physical Exam: Vital Signs BP (!) 165/84 (BP Location: Left Arm, Patient  Position: Sitting, Cuff Size: Normal)    Pulse 100    Ht 5\' 2"  (1.575 m)    Wt 138 lb (62.6 kg)    SpO2 96%    BMI 25.24 kg/m   Physical Exam  Constitutional:      General: Not in acute distress.    Appearance: Normal appearance. Not ill-appearing.  HENT:     Head: Normocephalic and atraumatic.  Eyes:     Pupils: Pupils are equal, round Neck:     Musculoskeletal: Normal range of motion.  Cardiovascular:     Rate and Rhythm: Normal rate    Pulses: Normal pulses.  Pulmonary:     Effort: Pulmonary effort is normal. No respiratory distress.  Abdomen: No hernia palpated. Musculoskeletal: Normal range of motion.  Skin:    General: Skin is warm and dry.     Findings: No erythema or rash.  Neurological:     General: No focal deficit present.     Mental Status: Alert and oriented to person, place, and time. Mental status is at baseline.     Motor: No weakness.  Psychiatric:        Mood and Affect: Mood normal.        Behavior: Behavior normal.    Assessment/Plan: The patient is scheduled for revision abdominoplasty with Dr. Claudia Desanctis.  Risks, benefits, and alternatives of procedure discussed, questions answered and consent obtained.    Smoking Status: Reports she quit smoking 2 months ago; Counseling Given?  Patient is aware of increased risks of smoking associated with surgery.  She will need nicotine test prior to surgery.  She was provided with order today.  Caprini Score: 6, high; Risk Factors include: Age, history of breast cancer and length of planned surgery. Recommendation for mechanical prophylaxis. Encourage early ambulation.   Pictures  obtained: Pictures were obtained of the patient and placed in the chart with the patient's or guardian's permission today.  Post-op Rx sent to pharmacy: Norco, Zofran  Patient was provided with the General Surgical Risk consent document and Pain Medication Agreement prior to their appointment.  They had adequate time to read through the risk  consent documents and Pain Medication Agreement. We also discussed them in person together during this preop appointment. All of their questions were answered to their satisfaction.  Recommended calling if they have any further questions.  Risk consent form and Pain Medication Agreement to be scanned into patient's chart.  The risk that can be encountered for this procedure were discussed and include the following but not limited to these: asymmetry, fluid accumulation, firmness of the tissue, skin loss, decrease or no sensation, fat necrosis, bleeding, infection, healing delay.  Deep vein thrombosis, cardiac and pulmonary complications are risks to any procedure.  There are risks of anesthesia, changes to skin sensation and injury to nerves or blood vessels.  The muscle can be temporarily or permanently injured.  You may have an allergic reaction to tape, suture, glue, blood products which can result in skin discoloration, swelling, pain, skin lesions, poor healing.  Any of these can lead to the need for revisonal surgery or stage procedures.  Weight gain and weigh loss can also effect the long term appearance. The results are not guaranteed to last a lifetime.  Future surgery may be required.    Patient reports that she is in agreement with moving forward with abdominoplasty revision, she reports that if fleur-de-lis technique is necessary that she is also in agreement with this.  Patient is aware to hold Celebrex 1 week prior to surgery.  Electronically signed by: Carola Rhine Ruthene Methvin, PA-C 06/24/2021 1:16 PM

## 2021-07-02 ENCOUNTER — Encounter: Payer: 59 | Admitting: Physician Assistant

## 2021-07-03 ENCOUNTER — Encounter: Payer: 59 | Admitting: Physician Assistant

## 2021-07-15 ENCOUNTER — Encounter: Payer: 59 | Admitting: Surgical

## 2021-07-21 ENCOUNTER — Telehealth: Payer: 59 | Admitting: Physician Assistant

## 2021-07-21 DIAGNOSIS — J069 Acute upper respiratory infection, unspecified: Secondary | ICD-10-CM

## 2021-07-21 MED ORDER — PREDNISONE 20 MG PO TABS: 20.0000 mg | ORAL_TABLET | Freq: Every day | ORAL | 0 refills | Status: DC

## 2021-07-21 MED ORDER — IPRATROPIUM BROMIDE 0.03 % NA SOLN: 2.0000 | Freq: Two times a day (BID) | NASAL | 0 refills | Status: DC

## 2021-07-21 MED ORDER — BENZONATATE 100 MG PO CAPS: 100.0000 mg | ORAL_CAPSULE | Freq: Three times a day (TID) | ORAL | 0 refills | Status: DC | PRN

## 2021-07-21 NOTE — Progress Notes (Signed)
E-Visit for Upper Respiratory Infection   We are sorry you are not feeling well.  Here is how we plan to help!  Based on what you have shared with me, it looks like you may have a viral upper respiratory infection.  Upper respiratory infections are caused by a large number of viruses; however, rhinovirus is the most common cause.   Symptoms vary from person to person, with common symptoms including sore throat, cough, fatigue or lack of energy and feeling of general discomfort.  A low-grade fever of up to 100.4 may present, but is often uncommon.  Symptoms vary however, and are closely related to a person's age or underlying illnesses.  The most common symptoms associated with an upper respiratory infection are nasal discharge or congestion, cough, sneezing, headache and pressure in the ears and face.  These symptoms usually persist for about 3 to 10 days, but can last up to 2 weeks.  It is important to know that upper respiratory infections do not cause serious illness or complications in most cases.    Upper respiratory infections can be transmitted from person to person, with the most common method of transmission being a person's hands.  The virus is able to live on the skin and can infect other persons for up to 2 hours after direct contact.  Also, these can be transmitted when someone coughs or sneezes; thus, it is important to cover the mouth to reduce this risk.  To keep the spread of the illness at [place], good hand hygiene is very important.  This is an infection that is most likely caused by a virus. There are no specific treatments other than to help you with the symptoms until the infection runs its course.  We are sorry you are not feeling well.  Here is how we plan to help!   For nasal congestion, you may use an oral decongestants such as Mucinex D or if you have glaucoma or high blood pressure use plain Mucinex.  Saline nasal spray or nasal drops can help and can safely be used as often as  needed for congestion.  For your congestion, I have prescribed Ipratropium Bromide nasal spray 0.03% two sprays in each nostril 2-3 times a day  If you do not have a history of heart disease, hypertension, diabetes or thyroid disease, prostate/bladder issues or glaucoma, you may also use Sudafed to treat nasal congestion.  It is highly recommended that you consult with a pharmacist or your primary care physician to ensure this medication is safe for you to take.     If you have a cough, you may use cough suppressants such as Delsym and Robitussin.  If you have glaucoma or high blood pressure, you can also use Coricidin HBP.   For cough I have prescribed for you A prescription cough medication called Tessalon Perles 100 mg. You may take 1-2 capsules every 8 hours as needed for cough and prednisone 20mg  take once daily for 5 days.   If you have a sore or scratchy throat, use a saltwater gargle-  to  teaspoon of salt dissolved in a 4-ounce to 8-ounce glass of warm water.  Gargle the solution for approximately 15-30 seconds and then spit.  It is important not to swallow the solution.  You can also use throat lozenges/cough drops and Chloraseptic spray to help with throat pain or discomfort.  Warm or cold liquids can also be helpful in relieving throat pain.  For headache, pain or general discomfort,  you can use Ibuprofen or Tylenol as directed.   Some authorities believe that zinc sprays or the use of Echinacea may shorten the course of your symptoms.   HOME CARE Only take medications as instructed by your medical team. Be sure to drink plenty of fluids. Water is fine as well as fruit juices, sodas and electrolyte beverages. You may want to stay away from caffeine or alcohol. If you are nauseated, try taking small sips of liquids. How do you know if you are getting enough fluid? Your urine should be a pale yellow or almost colorless. Get rest. Taking a steamy shower or using a humidifier may help  nasal congestion and ease sore throat pain. You can place a towel over your head and breathe in the steam from hot water coming from a faucet. Using a saline nasal spray works much the same way. Cough drops, hard candies and sore throat lozenges may ease your cough. Avoid close contacts especially the very young and the elderly Cover your mouth if you cough or sneeze Always remember to wash your hands.   GET HELP RIGHT AWAY IF: You develop worsening fever. If your symptoms do not improve within 10 days You develop yellow or green discharge from your nose over 3 days. You have coughing fits You develop a severe head ache or visual changes. You develop shortness of breath, difficulty breathing or start having chest pain Your symptoms persist after you have completed your treatment plan  MAKE SURE YOU  Understand these instructions. Will watch your condition. Will get help right away if you are not doing well or get worse.  Thank you for choosing an e-visit.  Your e-visit answers were reviewed by a board certified advanced clinical practitioner to complete your personal care plan. Depending upon the condition, your plan could have included both over the counter or prescription medications.  Please review your pharmacy choice. Make sure the pharmacy is open so you can pick up prescription now. If there is a problem, you may contact your provider through [employer] and have the prescription routed to another pharmacy.  Your safety is important to [place]. If you have drug allergies check your prescription carefully.   For the next 24 hours you can use MyChart to ask questions about today's visit, request a non-urgent call back, or ask for a work or school excuse. You will get an email in the next two days asking about your experience. I hope that your e-visit has been valuable and will speed your recovery.   I provided 5 minutes of non face-to-face time during this encounter for chart  review and documentation.

## 2021-07-22 LAB — NICOTINE/COTININE METABOLITES
Cotinine: <1 ng/mL
Nicotine: <1 ng/mL

## 2021-07-31 ENCOUNTER — Encounter: Payer: 59 | Admitting: Plastic Surgery

## 2021-08-11 DIAGNOSIS — F331 Major depressive disorder, recurrent, moderate: Secondary | ICD-10-CM | POA: Diagnosis not present

## 2021-08-14 ENCOUNTER — Encounter: Payer: 59 | Admitting: Physician Assistant

## 2021-08-18 DIAGNOSIS — F331 Major depressive disorder, recurrent, moderate: Secondary | ICD-10-CM | POA: Diagnosis not present

## 2021-08-22 NOTE — Progress Notes (Addendum)
? ?  Patient ID: Theresa Clay, female    DOB: 16-Dec-1955, 66 y.o.   MRN: [id number] ? ?Chief Complaint  ?Patient presents with  ? Pre-op Exam  ? ? ?  ICD-10-CM   ?1. Tobacco use  Z72.0 Nicotine/cotinine metabolites  ?  ?2. Encounter for cosmetic procedure  Z41.1   ?  ? ? ? ?History of Present Illness: ?Theresa Clay is a 66 y.o.  female  with a history of abdominoplasty.  She presents for preoperative evaluation for upcoming procedure, revision abdominoplasty, scheduled for 09/26/2021 with Dr. Claudia Desanctis. ? ?The patient has not had problems with anesthesia.  Risk factors for DVT include personal history of breast cancer as well as 5 spontaneous miscarriages.  She states this occurred when she was weighing over 300 pounds.  She has lost over 170 pounds after having gastric bypass surgery.  She reports that she has not had any nicotine-containing products for the past 3 months and has no intentions of resuming.  No history of CVA or MI.  No personal or family history of blood clots or clotting disorder.  She states that her revision is not due to cosmetic reasons, but rather because she has back discomfort in the context of overhanging skin.  She states that the excess hanging tissue is over her flanks, as well.  She had discussed possible fleur-de-lis with Dr. Claudia Desanctis and would be comfortable with any incisions/scars.  She plans to hold her Adderall day of surgery.  She also plans to discontinue the Celebrex that she has been taking for arthritic pain at least 7 days prior to surgery.  Patient provided with order requisition for nicotine test and will have that completed this week. ? ?Summary of Previous Visit: Patient was seen for consult on 04/30/2021, approximately 6 months postop from her abdominoplasty.  She was initially happy with the result, but feels as though the excess skin had recurred.  She also had resumed smoking tobacco in context of life stressors.  She and Dr. Claudia Desanctis reviewed pictures and while  she did have improvement from prior to her abdominoplasty, reviewed ways in which could be revised and improved given her recurrent laxity.  She then had a preoperative exam on 06/24/2021 at which point she states that it is not the aesthetic reasons, but rather ongoing back discomfort that has prompted her to seek out revision abdominoplasty.  She reports that this is exacerbated by excess tissue out laterally falling forward when standing.  Unfortunately, she had resumed smoking tobacco and then became sick with viral URI necessitating oral prednisone.  Her surgery was postponed.  She understands that nicotine test will be ordered prior to the rescheduling of her revision abdominoplasty. ? ?Job: Retired. ? ?PMH Significant for: Abdominoplasty, gastric bypass surgery, GERD, tobacco use disorder. ? ? ?Past Medical History: ?Allergies: ?Allergies  ?Allergen Reactions  ? Darvocet [Propoxyphene N-Acetaminophen] Anaphylaxis  ? Penicillins Anaphylaxis  ? Codeine Other (See Comments)  ?  "double over in pain" per pt  ? Hydrocodone Nausea And Vomiting  ?  pain  ? Oxycodone Nausea And Vomiting  ?  pain  ? Simvastatin   ?  myalgias  ? ? ?Current Medications: ? ?Current Outpatient Medications:  ?  amphetamine-dextroamphetamine (ADDERALL XR) 20 MG 24 hr capsule, Take 20 mg by mouth 2 (two) times daily., Disp: , Rfl:  ?  benzonatate (TESSALON) 100 MG capsule, Take 1 capsule (100 mg total) by mouth 3 (three) times daily as needed., Disp: 30 capsule, Rfl:  0 ?  celecoxib (CELEBREX) 200 MG capsule, Take 200 mg by mouth daily., Disp: , Rfl:  ?  cyanocobalamin (,VITAMIN B-12,) 1000 MCG/ML injection, INJECT 1 ML SUBCUTANEOUSLY EVERY MONTH, Disp: 1 mL, Rfl: 11 ?  Diclofenac Sodium 3 % GEL, APPLY 4GRAMS TO AFFECTED AREA(S) TWO TIMES A DAY, Disp: , Rfl:  ?  diphenhydrAMINE (BENADRYL) 25 MG tablet, 1 tablet at bedtime as needed (Patient not taking: Reported on 06/24/2021), Disp: , Rfl:  ?  escitalopram (LEXAPRO) 10 MG tablet, Take 10 mg by  mouth daily., Disp: , Rfl:  ?  fluconazole (DIFLUCAN) 150 MG tablet, Take 150 mg by mouth as needed., Disp: , Rfl:  ?  HYDROcodone-acetaminophen (NORCO/VICODIN) 5-325 MG tablet, as needed., Disp: , Rfl:  ?  ipratropium (ATROVENT) 0.03 % nasal spray, Place 2 sprays into both nostrils every 12 (twelve) hours., Disp: 30 mL, Rfl: 0 ?  levothyroxine (SYNTHROID) 100 MCG tablet, Take 1 tablet by mouth daily before breakfast., Disp: , Rfl:  ?  LIDODERM 5 %, Place 1-3 patches onto the skin every 12 (twelve) hours as needed (pain)., Disp: , Rfl:  ?  linaclotide (LINZESS) 290 MCG CAPS capsule, Take 290 mcg by mouth daily before breakfast., Disp: , Rfl:  ?  LORazepam (ATIVAN) 0.5 MG tablet, Take 0.5 mg by mouth 2 (two) times daily., Disp: , Rfl:  ?  LORazepam (ATIVAN) 1 MG tablet, Take 0.5-1 mg by mouth 2 (two) times daily. Take '1mg'$  at night and 0.'5mg'$  in the morning, Disp: , Rfl:  ?  mupirocin ointment (BACTROBAN) 2 %, Apply topically 3 (three) times daily., Disp: , Rfl:  ?  nystatin-triamcinolone (MYCOLOG II) cream, 1 application, Disp: , Rfl:  ?  omeprazole (PRILOSEC) 40 MG capsule, 40 mg daily., Disp: , Rfl:  ?  ondansetron (ZOFRAN) 4 MG tablet, Take 1 tablet (4 mg total) by mouth every 8 (eight) hours as needed for nausea or vomiting., Disp: 20 tablet, Rfl: 0 ?  pentazocine-naloxone (TALWIN NX) 50-0.5 MG tablet, Take 1 tablet by mouth as needed., Disp: , Rfl:  ?  predniSONE (DELTASONE) 20 MG tablet, Take 1 tablet (20 mg total) by mouth daily with breakfast., Disp: 5 tablet, Rfl: 0 ?  PREVIDENT 5000 BOOSTER PLUS 1.1 % PSTE, See admin instructions., Disp: , Rfl:  ?  promethazine-codeine (PHENERGAN WITH CODEINE) 6.25-10 MG/5ML syrup, 5 ml as needed, Disp: , Rfl:  ?  tiZANidine (ZANAFLEX) 4 MG tablet, Take 4 mg by mouth once., Disp: , Rfl:  ?  traZODone (DESYREL) 50 MG tablet, Take 50 mg by mouth at bedtime., Disp: , Rfl:  ?  triamcinolone cream (KENALOG) 0.1 %, Apply topically 2 (two) times a week., Disp: , Rfl:  ?   valACYclovir (VALTREX) 1000 MG tablet, Take 1,000 mg by mouth 2 (two) times daily., Disp: , Rfl:  ?  varenicline (CHANTIX) 0.5 MG tablet, Take by mouth., Disp: , Rfl:  ?  Vitamin D, Ergocalciferol, (DRISDOL) 1.25 MG (50000 UNIT) CAPS capsule, TAKE 1 CAPSULE BY MOUTH 2 TIMES A WEEK., Disp: 24 capsule, Rfl: 1 ? ?Past Medical Problems: ?Past Medical History:  ?Diagnosis Date  ? Anxiety   ? Bipolar 1 disorder ([place])   ? Breast cancer ([place]) 12/13/07  ? left , ER/PR +  ? Depression   ? Esophageal reflux   ? Fibromyalgia   ? GERD (gastroesophageal reflux disease)   ? Hx of radiation therapy 04/04/08-05/17/08  ? left breast, tumor bed  ? Hyperlipemia   ? 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 Hypothyroidism   ? Osteoarthritis   ? Personal history of radiation therapy 2009  ? ? ?Past Surgical History: ?Past Surgical History:  ?Procedure Laterality Date  ? BREAST BIOPSY Left 01/09/2008  ? BREAST LIFT Right 10/2020  ? BREAST LUMPECTOMY Left 01/2008  ? CHOLECYSTECTOMY    ? GASTRIC BYPASS    ? KNEE ARTHROSCOPY Left 08/09/2012  ? Procedure: ARTHROSCOPY LEFT KNEE chondroplasty;  Surgeon: Hessie Dibble, MD;  Location: [facility];  Service: Orthopedics;  Laterality: Left;  ? KNEE SURGERY  10/2011  ? reconstruction  ? TONSILLECTOMY    ? TOTAL ABDOMINAL HYSTERECTOMY W/ BILATERAL SALPINGOOPHORECTOMY    ? ? ?Social History: ?Social History  ? ?Socioeconomic History  ? Marital status: Married  ?  Spouse name: Not on file  ? Number of children: 1  ? Years of education: college  ? Highest education level: Not on file  ?Occupational History  ? Occupation: disabled  ?  Comment: for [employer]  ?Tobacco Use  ? Smoking status: Former  ?  Packs/day: 0.50  ?  Years: 5.00  ?  Pack years: 2.50  ?  Types: Cigarettes  ?  Quit date: 07/30/2020  ?  Years since quitting: 1.0  ? Smokeless tobacco: Never  ? Tobacco comments:  ?  5-6 cigarettes daily  ?Vaping Use  ? Vaping Use: Never used  ?Substance and Sexual Activity  ? Alcohol use: No  ? Drug use: No   ? Sexual activity: Yes  ?  Birth control/protection: Surgical  ?Other Topics Concern  ? Not on file  ?Social History Narrative  ? Not on file  ? ?Social Determinants of Health  ? ?Financial Resource Strain: Not

## 2021-08-25 DIAGNOSIS — E039 Hypothyroidism, unspecified: Secondary | ICD-10-CM | POA: Diagnosis not present

## 2021-08-25 DIAGNOSIS — B009 Herpesviral infection, unspecified: Secondary | ICD-10-CM | POA: Diagnosis not present

## 2021-08-25 DIAGNOSIS — E119 Type 2 diabetes mellitus without complications: Secondary | ICD-10-CM | POA: Diagnosis not present

## 2021-08-25 DIAGNOSIS — M1990 Unspecified osteoarthritis, unspecified site: Secondary | ICD-10-CM | POA: Diagnosis not present

## 2021-08-25 DIAGNOSIS — Z0000 Encounter for general adult medical examination without abnormal findings: Secondary | ICD-10-CM | POA: Diagnosis not present

## 2021-08-25 DIAGNOSIS — M797 Fibromyalgia: Secondary | ICD-10-CM | POA: Diagnosis not present

## 2021-08-25 DIAGNOSIS — Z23 Encounter for immunization: Secondary | ICD-10-CM | POA: Diagnosis not present

## 2021-08-25 DIAGNOSIS — K5900 Constipation, unspecified: Secondary | ICD-10-CM | POA: Diagnosis not present

## 2021-08-25 DIAGNOSIS — E782 Mixed hyperlipidemia: Secondary | ICD-10-CM | POA: Diagnosis not present

## 2021-08-25 DIAGNOSIS — E559 Vitamin D deficiency, unspecified: Secondary | ICD-10-CM | POA: Diagnosis not present

## 2021-08-27 ENCOUNTER — Ambulatory Visit (INDEPENDENT_AMBULATORY_CARE_PROVIDER_SITE_OTHER): Payer: 59 | Admitting: Physician Assistant

## 2021-08-27 ENCOUNTER — Encounter: Payer: Self-pay | Admitting: Physician Assistant

## 2021-08-27 ENCOUNTER — Other Ambulatory Visit: Payer: Self-pay

## 2021-08-27 VITALS — BP 158/96 | HR 89 | Ht 62.0 in | Wt 140.0 lb

## 2021-08-27 DIAGNOSIS — Z411 Encounter for cosmetic surgery: Secondary | ICD-10-CM

## 2021-08-27 DIAGNOSIS — Z720 Tobacco use: Secondary | ICD-10-CM

## 2021-08-27 MED ORDER — ONDANSETRON 4 MG PO TBDP: 4.0000 mg | ORAL_TABLET | Freq: Three times a day (TID) | ORAL | 0 refills | Status: DC | PRN

## 2021-08-27 MED ORDER — ENOXAPARIN SODIUM 40 MG/0.4ML IJ SOSY: 40.0000 mg | PREFILLED_SYRINGE | INTRAMUSCULAR | 0 refills | Status: DC

## 2021-08-27 NOTE — Addendum Note (Signed)
Addended by: Harl Bowie on: 08/27/2021 01:09 PM ? ? Modules accepted: Orders ? ?

## 2021-08-27 NOTE — Addendum Note (Signed)
Addended by: Krista Blue on: 08/27/2021 01:49 PM ? ? Modules accepted: Orders ? ?

## 2021-08-27 NOTE — Addendum Note (Signed)
Addended by: Krista Blue on: 08/27/2021 01:52 PM ? ? Modules accepted: Orders ? ?

## 2021-08-28 DIAGNOSIS — F331 Major depressive disorder, recurrent, moderate: Secondary | ICD-10-CM | POA: Diagnosis not present

## 2021-08-30 LAB — NICOTINE/COTININE METABOLITES
Cotinine: <1 ng/mL
Nicotine: <1 ng/mL

## 2021-09-04 ENCOUNTER — Encounter: Payer: 59 | Admitting: Physician Assistant

## 2021-10-02 ENCOUNTER — Ambulatory Visit (INDEPENDENT_AMBULATORY_CARE_PROVIDER_SITE_OTHER): Payer: Self-pay | Admitting: Plastic Surgery

## 2021-10-02 DIAGNOSIS — Z411 Encounter for cosmetic surgery: Secondary | ICD-10-CM

## 2021-10-02 NOTE — Progress Notes (Signed)
Patient presents 1 week postop from revision abdominoplasty.  She feels good and is taking minimal pain medication.  Her drain is put out 10 to 15 cc/day for the past few days so was removed today.  On examination she has minimal subcutaneous fluid and mild bruising.  Umbilicus is viable.  Overall looks like a great result thus far.  We will have her continue work-up with supportive garments and avoid strenuous activity and see her again in a few weeks. ?

## 2021-10-20 DIAGNOSIS — F331 Major depressive disorder, recurrent, moderate: Secondary | ICD-10-CM | POA: Diagnosis not present

## 2021-10-23 ENCOUNTER — Telehealth: Payer: Self-pay | Admitting: *Deleted

## 2021-10-23 ENCOUNTER — Encounter: Payer: Self-pay | Admitting: Surgical

## 2021-10-23 ENCOUNTER — Ambulatory Visit (INDEPENDENT_AMBULATORY_CARE_PROVIDER_SITE_OTHER): Payer: Self-pay | Admitting: Surgical

## 2021-10-23 DIAGNOSIS — Z411 Encounter for cosmetic surgery: Secondary | ICD-10-CM

## 2021-10-23 NOTE — Progress Notes (Signed)
Patient is a 66 year old female here for follow-up after revision abdominoplasty with Dr. Claudia Desanctis on 09/26/2021.  She reports overall she is doing well, reports she has some sutures that are protruding through the incisions that are bothering her.  She reports overall she is pleased with her results, she is not having any issues at this time.  Chaperone present on exam On exam umbilicus is viable, abdominal incision is intact, healing well.  There is no erythema or cellulitic changes noted.  No subcutaneous fluid collection noted palpation.  Recommend continue with compressive garments 24/7 for 2 more weeks, then can transition to only when active.  Avoid heavy lifting or strenuous activities for a few more weeks.  No signs of infection on exam.  We discussed scheduling additional follow-up as needed.  Recommend calling with questions or concerns.  Pictures were taken and placed in the patient's chart with patient's permission.

## 2021-10-23 NOTE — Telephone Encounter (Signed)
Pt called to ask if she still has to sleep on post-op wedge pillow. Pt informed - per Scheeler, PA-c - that she can sleep without it. Pt verbalized understanding.

## 2021-10-30 ENCOUNTER — Telehealth: Payer: Self-pay

## 2021-10-30 ENCOUNTER — Other Ambulatory Visit: Payer: Self-pay

## 2021-10-30 DIAGNOSIS — D508 Other iron deficiency anemias: Secondary | ICD-10-CM

## 2021-10-30 DIAGNOSIS — E538 Deficiency of other specified B group vitamins: Secondary | ICD-10-CM

## 2021-10-30 DIAGNOSIS — D500 Iron deficiency anemia secondary to blood loss (chronic): Secondary | ICD-10-CM

## 2021-10-30 NOTE — Telephone Encounter (Signed)
Returned patient's call regarding recent surgery and request for review of iron labs.  Dr. Irene Limbo aware and patient is to be scheduled for labs followed by telephone visit the following day to review results. Scheduling message sent and lab orders placed. Patient made aware that someone will be reaching out within the next few days to get her scheduled. Patient appreciative of call.

## 2021-11-06 ENCOUNTER — Other Ambulatory Visit: Payer: Self-pay

## 2021-11-06 ENCOUNTER — Inpatient Hospital Stay: Payer: 59 | Attending: Hematology

## 2021-11-06 DIAGNOSIS — E538 Deficiency of other specified B group vitamins: Secondary | ICD-10-CM | POA: Insufficient documentation

## 2021-11-06 DIAGNOSIS — E559 Vitamin D deficiency, unspecified: Secondary | ICD-10-CM | POA: Insufficient documentation

## 2021-11-06 DIAGNOSIS — D508 Other iron deficiency anemias: Secondary | ICD-10-CM

## 2021-11-06 DIAGNOSIS — Z79899 Other long term (current) drug therapy: Secondary | ICD-10-CM | POA: Insufficient documentation

## 2021-11-06 DIAGNOSIS — D509 Iron deficiency anemia, unspecified: Secondary | ICD-10-CM | POA: Diagnosis not present

## 2021-11-06 DIAGNOSIS — Z9884 Bariatric surgery status: Secondary | ICD-10-CM | POA: Insufficient documentation

## 2021-11-06 DIAGNOSIS — D500 Iron deficiency anemia secondary to blood loss (chronic): Secondary | ICD-10-CM

## 2021-11-06 LAB — IRON AND IRON BINDING CAPACITY (CC-WL,HP ONLY)
Iron: 118 ug/dL (ref 28–170)
Saturation Ratios: 33 % — ABNORMAL HIGH (ref 10.4–31.8)
TIBC: 358 ug/dL (ref 250–450)
UIBC: 240 ug/dL (ref 148–442)

## 2021-11-06 LAB — CMP (CANCER CENTER ONLY)
ALT: 40 U/L (ref 0–44)
AST: 27 U/L (ref 15–41)
Albumin: 4.8 g/dL (ref 3.5–5.0)
Alkaline Phosphatase: 111 U/L (ref 38–126)
Anion gap: 10 (ref 5–15)
BUN: 14 mg/dL (ref 8–23)
CO2: 22 mmol/L (ref 22–32)
Calcium: 10.6 mg/dL — ABNORMAL HIGH (ref 8.9–10.3)
Chloride: 106 mmol/L (ref 98–111)
Creatinine: 1.09 mg/dL — ABNORMAL HIGH (ref 0.44–1.00)
GFR, Estimated: 57 mL/min — ABNORMAL LOW (ref >60)
Glucose, Bld: 121 mg/dL — ABNORMAL HIGH (ref 70–99)
Potassium: 3.9 mmol/L (ref 3.5–5.1)
Sodium: 138 mmol/L (ref 135–145)
Total Bilirubin: 0.5 mg/dL (ref 0.3–1.2)
Total Protein: 7.6 g/dL (ref 6.5–8.1)

## 2021-11-06 LAB — CBC WITH DIFFERENTIAL (CANCER CENTER ONLY)
Abs Immature Granulocytes: 0.02 10*3/uL (ref 0.00–0.07)
Basophils Absolute: 0 10*3/uL (ref 0.0–0.1)
Basophils Relative: 0 %
Eosinophils Absolute: 0.1 10*3/uL (ref 0.0–0.5)
Eosinophils Relative: 1 %
HCT: 46.1 % — ABNORMAL HIGH (ref 36.0–46.0)
Hemoglobin: 15.3 g/dL — ABNORMAL HIGH (ref 12.0–15.0)
Immature Granulocytes: 0 %
Lymphocytes Relative: 28 %
Lymphs Abs: 1.9 10*3/uL (ref 0.7–4.0)
MCH: 30.6 pg (ref 26.0–34.0)
MCHC: 33.2 g/dL (ref 30.0–36.0)
MCV: 92.2 fL (ref 80.0–100.0)
Monocytes Absolute: 0.3 10*3/uL (ref 0.1–1.0)
Monocytes Relative: 4 %
Neutro Abs: 4.6 10*3/uL (ref 1.7–7.7)
Neutrophils Relative %: 67 %
Platelet Count: 259 10*3/uL (ref 150–400)
RBC: 5 MIL/uL (ref 3.87–5.11)
RDW: 12.6 % (ref 11.5–15.5)
WBC Count: 6.9 10*3/uL (ref 4.0–10.5)
nRBC: 0 % (ref 0.0–0.2)

## 2021-11-06 LAB — VITAMIN B12: Vitamin B-12: 566 pg/mL (ref 180–914)

## 2021-11-06 LAB — FERRITIN: Ferritin: 95 ng/mL (ref 11–307)

## 2021-11-10 ENCOUNTER — Inpatient Hospital Stay (HOSPITAL_BASED_OUTPATIENT_CLINIC_OR_DEPARTMENT_OTHER): Payer: 59 | Admitting: Hematology

## 2021-11-10 DIAGNOSIS — Z79899 Other long term (current) drug therapy: Secondary | ICD-10-CM | POA: Diagnosis not present

## 2021-11-10 DIAGNOSIS — Z9884 Bariatric surgery status: Secondary | ICD-10-CM | POA: Diagnosis not present

## 2021-11-10 DIAGNOSIS — D500 Iron deficiency anemia secondary to blood loss (chronic): Secondary | ICD-10-CM

## 2021-11-10 DIAGNOSIS — F331 Major depressive disorder, recurrent, moderate: Secondary | ICD-10-CM | POA: Diagnosis not present

## 2021-11-10 DIAGNOSIS — D509 Iron deficiency anemia, unspecified: Secondary | ICD-10-CM | POA: Diagnosis not present

## 2021-11-10 DIAGNOSIS — E538 Deficiency of other specified B group vitamins: Secondary | ICD-10-CM

## 2021-11-10 DIAGNOSIS — E559 Vitamin D deficiency, unspecified: Secondary | ICD-10-CM | POA: Diagnosis not present

## 2021-11-10 NOTE — Progress Notes (Signed)
[facility]    HEMATOLOGY/ONCOLOGY PHONE VISIT NOTE  Date of Service: 11/10/2021   Patient Care Team: Angelina Pih, MD as PCP - General (Family Medicine)  CHIEF COMPLAINTS/PURPOSE OF CONSULTATION:  Iron deficiency Anemia  HISTORY OF PRESENTING ILLNESS:  Theresa Clay is a wonderful 66 y.o. female who has been referred to [place] by Dr Angelina Pih, MD  for evaluation and management of Iron deficiency Anemia to consider IV Iron infusion.  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  Patient has recently noted significantly increased fatigue and exhaustion. She notes that she has developed food craving for boiled potatoes. Labs done with her primary care physician recently on 09/22/2016 showed a hemoglobin of 9.2 with a ferritin of 3.2 she notes no overt GI bleeding. Likely has absorption issues related to her Roux-en-Y gastric bypass surgery.   She was previously on by mouth iron replacement for the last 3 years but stopped it due to GI intolerance. Has been on daily multivitamin but no specific B12 or other B vitamin replacements at this time . Patient notes that she has had B12 deficiency in the past and was on B12 shots until 4 years ago. Has been on calcium and vitamin D.   Patient notes no overt GI bleeding no hematemesis no melena no hematochezia no hematuria no epistaxis or gum bleeding. Patient notes she last had a colonoscopy about 2 years ago and whether to have a benign polyp but no other issues with GI bleeding. Had an EGD in 2014 which was unrevealing.  Patient notes that she doesn't think she can tolerate oral iron replacement and really wants her iron replaced since she feels "lousy".  No acute weight loss. No other new focal symptoms.  INTERVAL HISTORY I connected with Alvera Novel on 11/10/2021 at 3:30 PM EST by telephone visit and verified that I am speaking with the correct person using two identifiers.   I discussed the limitations, risks, security and privacy concerns of performing an evaluation and management service by telemedicine and the availability of in-person appointments. I also discussed with the patient that there may be a patient responsible charge related to this service. The patient expressed understanding and agreed to proceed.   Other persons participating in the visit and their role in the encounter: None  Patient's location: Home Provider's location: Theresa Clay is a 66 y.o. female here for f/u if her Iron deficiency Anemia. The patient's last visit with [place] was on 03/28/2021. She reports She is doing well with no new symptoms or concerns.  She notes a recent surgical revision for her previous abdominoplasty that occurred 09/26/2021.  She reports issues with sleeping. She was advised by her PCP to take 2 tablets Trazodone as needed for sleep.  No other new or acute focal symptoms.  Discussed labs done 11/06/2021 in detail.   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     SURGICAL HISTORY: Past Surgical History:  Procedure Laterality Date   BREAST BIOPSY Left 01/09/2008   BREAST LIFT Right 10/2020   BREAST LUMPECTOMY Left 01/2008   CHOLECYSTECTOMY     GASTRIC BYPASS     KNEE ARTHROSCOPY Left 08/09/2012    Procedure: ARTHROSCOPY LEFT KNEE chondroplasty;  Surgeon: Hessie Dibble, MD;  Location: [facility];  Service: Orthopedics;  Laterality: Left;   KNEE SURGERY  10/2011   reconstruction   TONSILLECTOMY     TOTAL ABDOMINAL HYSTERECTOMY W/ BILATERAL SALPINGOOPHORECTOMY      SOCIAL HISTORY: Social History   Socioeconomic History   Marital status: Married    Spouse name: Not on file   Number of children: 1   Years of education: college   Highest education level: Not on file  Occupational History   Occupation: disabled    Comment: for [employer]  Tobacco Use   Smoking status: Former    Packs/day: 0.50    Years: 5.00    Total pack years: 2.50    Types: Cigarettes    Quit date: 07/30/2020    Years since quitting: 1.2   Smokeless tobacco: Never   Tobacco comments:    5-6 cigarettes daily  Vaping Use   Vaping Use: Never used  Substance and Sexual Activity   Alcohol use: No   Drug use: No   Sexual activity: Yes    Birth control/protection: Surgical  Other Topics Concern   Not on file  Social History Narrative   Not on file   Social Determinants of Health   Financial Resource Strain: Not on file  Food Insecurity: Not on file  Transportation Needs: Not on file  Physical Activity: Not on file  Stress: Not on file  Social Connections: Not on file  Intimate Partner Violence: Not on file    FAMILY HISTORY: Family History  Problem Relation Age of Onset   Breast cancer Mother 61   Melanoma Mother    Dementia Father    Cancer - Prostate Maternal Grandfather     ALLERGIES:  is allergic to darvocet [propoxyphene n-acetaminophen], penicillins, and simvastatin.  MEDICATIONS:  Current Outpatient Medications  Medication Sig Dispense Refill   amphetamine-dextroamphetamine (ADDERALL XR) 20 MG 24 hr capsule Take 20 mg by mouth 2 (two) times daily.     benzonatate (TESSALON) 100 MG capsule Take 1 capsule (100 mg total) by mouth 3 (three) times daily as needed. 30 capsule  0   cyanocobalamin (,VITAMIN B-12,) 1000 MCG/ML injection INJECT 1 ML SUBCUTANEOUSLY EVERY MONTH 1 mL 11   Diclofenac Sodium 3 % GEL APPLY 4GRAMS TO AFFECTED AREA(S) TWO TIMES A DAY     diphenhydrAMINE (BENADRYL) 25 MG tablet      escitalopram (LEXAPRO) 10 MG tablet Take 10 mg by mouth daily.     levothyroxine (SYNTHROID) 100 MCG tablet Take 1 tablet by mouth daily before breakfast.     linaclotide (LINZESS) 290 MCG CAPS capsule Take 290 mcg by mouth daily before breakfast.     LORazepam (ATIVAN) 1 MG tablet Take 0.5-1 mg by mouth 2 (two) times daily. Take '1mg'$  at night and 0.'5mg'$  in the morning     mupirocin ointment (BACTROBAN) 2 % Apply topically 3 (three) times daily.     nystatin-triamcinolone (MYCOLOG II) cream 1 application     ondansetron (ZOFRAN) 4 MG tablet Take  1 tablet (4 mg total) by mouth every 8 (eight) hours as needed for nausea or vomiting. 20 tablet 0   ondansetron (ZOFRAN-ODT) 4 MG disintegrating tablet Take 1 tablet (4 mg total) by mouth every 8 (eight) hours as needed for nausea or vomiting. 20 tablet 0   PREVIDENT 5000 BOOSTER PLUS 1.1 % PSTE See admin instructions.     tiZANidine (ZANAFLEX) 4 MG tablet Take 4 mg by mouth once.     traZODone (DESYREL) 100 MG tablet Take 100 mg by mouth at bedtime.     triamcinolone cream (KENALOG) 0.1 % Apply topically 2 (two) times a week.     valACYclovir (VALTREX) 1000 MG tablet Take 1,000 mg by mouth daily.     Vitamin D, Ergocalciferol, (DRISDOL) 1.25 MG (50000 UNIT) CAPS capsule TAKE 1 CAPSULE BY MOUTH 2 TIMES A WEEK. 24 capsule 1   No current facility-administered medications for this visit.    REVIEW OF SYSTEMS:   .10 Point review of Systems was done is negative except as noted above.   PHYSICAL EXAMINATION: Telemedicine appointment  LABORATORY DATA:  I have reviewed the data as listed  .    Latest Ref Rng & Units 11/06/2021   10:49 AM 03/28/2021   12:16 PM 09/26/2020   10:51 AM  CBC  WBC 4.0 - 10.5 K/uL 6.9  7.6  8.7    Hemoglobin 12.0 - 15.0 g/dL 15.3  14.8  14.7   Hematocrit 36.0 - 46.0 % 46.1  45.3  45.8   Platelets 150 - 400 K/uL 259  273  260    . CBC    Component Value Date/Time   WBC 6.9 11/06/2021 1049   WBC 8.7 09/26/2020 1051   RBC 5.00 11/06/2021 1049   HGB 15.3 (H) 11/06/2021 1049   HGB 15.0 12/29/2016 0920   HCT 46.1 (H) 11/06/2021 1049   HCT 48.0 (H) 12/29/2016 0920   PLT 259 11/06/2021 1049   PLT 290 12/29/2016 0920   MCV 92.2 11/06/2021 1049   MCV 89.9 12/29/2016 0920   MCH 30.6 11/06/2021 1049   MCHC 33.2 11/06/2021 1049   RDW 12.6 11/06/2021 1049   RDW 17.5 (H) 12/29/2016 0920   LYMPHSABS 1.9 11/06/2021 1049   LYMPHSABS 1.9 12/29/2016 0920   MONOABS 0.3 11/06/2021 1049   MONOABS 0.4 12/29/2016 0920   EOSABS 0.1 11/06/2021 1049   EOSABS 0.3 12/29/2016 0920   BASOSABS 0.0 11/06/2021 1049   BASOSABS 0.0 12/29/2016 0920     .    Latest Ref Rng & Units 11/06/2021   10:49 AM 03/28/2021   12:16 PM 09/26/2020   10:51 AM  CMP  Glucose 70 - 99 mg/dL 121  126  137   BUN 8 - 23 mg/dL '14  12  18   '$ Creatinine 0.44 - 1.00 mg/dL 1.09  1.06  1.07   Sodium 135 - 145 mmol/L 138  139  138   Potassium 3.5 - 5.1 mmol/L 3.9  4.2  4.6   Chloride 98 - 111 mmol/L 106  109  106   CO2 22 - 32 mmol/L '22  21  22   '$ Calcium 8.9 - 10.3 mg/dL 10.6  9.8  9.6   Total Protein 6.5 - 8.1 g/dL 7.6  6.9  7.1   Total Bilirubin 0.3 - 1.2 mg/dL 0.5  0.4  0.4   Alkaline Phos 38 - 126 U/L 111  116  116   AST 15 - 41 U/L 27  15  17  ALT 0 - 44 U/L 40  9  9    . Lab Results  Component Value Date   IRON 118 11/06/2021   TIBC 358 11/06/2021   IRONPCTSAT 33 (H) 11/06/2021   (Iron and TIBC)  Lab Results  Component Value Date   FERRITIN 95 11/06/2021       B12 ---133--->307-->566   RADIOGRAPHIC STUDIES: I have personally reviewed the radiological images as listed and agreed with the findings in the report. No results found.  ASSESSMENT & PLAN:   66 y.o. female with  #1 history of  severe iron deficiency anemia Likely due to poor iron absorption in the setting of chronic PPI therapy and history of Roux-en-Y gastric bypass surgery. No overt issues with GI bleeding. Absorption seems the primary issue since the patient also has coexistent B12 deficiency and vitamin D deficiency.    #2 vitamin B12 deficiency. Likely related to poor absorption due to chronic PPI therapy and Roux-en-Y gastric bypass surgery. Antiparietal cell and anti-intrinsic factor antibodies negative -no evidence of pernicious anemia. No clinical evidence of gluten intolerance. B12 levels wnl @ about 372 PLAN: -continue [place] B12 1000 mcg q4weeks  #3 severe vitamin D deficiency  Likely due to poor absorption due to chronic PPI therapy and Roux-en-Y gastric bypass surgery. Plan -mx per PCP  #4 . 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  CBC within normal limits hemoglobin of 15.3. WBC count of 6.9k platelets 259k.  CMP stable Ferritin 95 with an iron saturation of 33% No indication for additional IV iron at this time. B12  566 -Continue Vitamin B12 injections.  -continue f/u with PCP for mx of other medical co-morbidities.  FOLLOW UP: RTC with Dr kale with labs in 12 months  . The total time spent in the appointment was 20 minutes and more than 50% was on counseling and direct patient cares.   All of the patient's questions were answered with apparent satisfaction. The patient knows to call the clinic with any problems, questions or concerns.    Sullivan Lone MD MS AAHIVMS [facility] Hematology/Oncology Physician [facility]  I, Melene Muller, am  acting as scribe for Dr. Sullivan Lone, MD.

## 2021-11-13 ENCOUNTER — Encounter: Payer: Self-pay | Admitting: Hematology

## 2021-11-24 ENCOUNTER — Other Ambulatory Visit: Payer: Self-pay | Admitting: Hematology

## 2021-11-25 DIAGNOSIS — F331 Major depressive disorder, recurrent, moderate: Secondary | ICD-10-CM | POA: Diagnosis not present

## 2021-12-30 DIAGNOSIS — F331 Major depressive disorder, recurrent, moderate: Secondary | ICD-10-CM | POA: Diagnosis not present

## 2022-01-13 DIAGNOSIS — Z79899 Other long term (current) drug therapy: Secondary | ICD-10-CM | POA: Diagnosis not present

## 2022-01-13 DIAGNOSIS — E782 Mixed hyperlipidemia: Secondary | ICD-10-CM | POA: Diagnosis not present

## 2022-01-13 DIAGNOSIS — F17200 Nicotine dependence, unspecified, uncomplicated: Secondary | ICD-10-CM | POA: Diagnosis not present

## 2022-01-13 DIAGNOSIS — E119 Type 2 diabetes mellitus without complications: Secondary | ICD-10-CM | POA: Diagnosis not present

## 2022-01-26 ENCOUNTER — Other Ambulatory Visit: Payer: Self-pay | Admitting: Family Medicine

## 2022-01-26 DIAGNOSIS — Z1231 Encounter for screening mammogram for malignant neoplasm of breast: Secondary | ICD-10-CM

## 2022-03-17 ENCOUNTER — Other Ambulatory Visit: Payer: Self-pay

## 2022-03-20 ENCOUNTER — Encounter: Payer: Self-pay | Admitting: Plastic Surgery

## 2022-03-26 ENCOUNTER — Other Ambulatory Visit: Payer: Self-pay

## 2022-03-26 DIAGNOSIS — D500 Iron deficiency anemia secondary to blood loss (chronic): Secondary | ICD-10-CM

## 2022-03-27 ENCOUNTER — Inpatient Hospital Stay: Payer: 59

## 2022-03-27 ENCOUNTER — Inpatient Hospital Stay: Payer: 59 | Attending: Hematology | Admitting: Hematology

## 2022-03-27 ENCOUNTER — Other Ambulatory Visit: Payer: Self-pay

## 2022-03-27 VITALS — BP 148/88 | HR 64 | Temp 98.0°F | Resp 15 | Ht 62.0 in | Wt 120.8 lb

## 2022-03-27 DIAGNOSIS — E785 Hyperlipidemia, unspecified: Secondary | ICD-10-CM | POA: Insufficient documentation

## 2022-03-27 DIAGNOSIS — M1990 Unspecified osteoarthritis, unspecified site: Secondary | ICD-10-CM | POA: Insufficient documentation

## 2022-03-27 DIAGNOSIS — R5383 Other fatigue: Secondary | ICD-10-CM | POA: Diagnosis not present

## 2022-03-27 DIAGNOSIS — Z79624 Long term (current) use of inhibitors of nucleotide synthesis: Secondary | ICD-10-CM | POA: Insufficient documentation

## 2022-03-27 DIAGNOSIS — D509 Iron deficiency anemia, unspecified: Secondary | ICD-10-CM | POA: Insufficient documentation

## 2022-03-27 DIAGNOSIS — Z8042 Family history of malignant neoplasm of prostate: Secondary | ICD-10-CM | POA: Insufficient documentation

## 2022-03-27 DIAGNOSIS — D500 Iron deficiency anemia secondary to blood loss (chronic): Secondary | ICD-10-CM

## 2022-03-27 DIAGNOSIS — E538 Deficiency of other specified B group vitamins: Secondary | ICD-10-CM | POA: Insufficient documentation

## 2022-03-27 DIAGNOSIS — K219 Gastro-esophageal reflux disease without esophagitis: Secondary | ICD-10-CM | POA: Diagnosis not present

## 2022-03-27 DIAGNOSIS — Z923 Personal history of irradiation: Secondary | ICD-10-CM | POA: Insufficient documentation

## 2022-03-27 DIAGNOSIS — Z9884 Bariatric surgery status: Secondary | ICD-10-CM | POA: Diagnosis not present

## 2022-03-27 DIAGNOSIS — M797 Fibromyalgia: Secondary | ICD-10-CM | POA: Diagnosis not present

## 2022-03-27 DIAGNOSIS — Z803 Family history of malignant neoplasm of breast: Secondary | ICD-10-CM | POA: Insufficient documentation

## 2022-03-27 DIAGNOSIS — F319 Bipolar disorder, unspecified: Secondary | ICD-10-CM | POA: Diagnosis not present

## 2022-03-27 DIAGNOSIS — Z85820 Personal history of malignant melanoma of skin: Secondary | ICD-10-CM | POA: Diagnosis not present

## 2022-03-27 DIAGNOSIS — E039 Hypothyroidism, unspecified: Secondary | ICD-10-CM | POA: Diagnosis not present

## 2022-03-27 DIAGNOSIS — Z853 Personal history of malignant neoplasm of breast: Secondary | ICD-10-CM | POA: Diagnosis not present

## 2022-03-27 DIAGNOSIS — I10 Essential (primary) hypertension: Secondary | ICD-10-CM | POA: Insufficient documentation

## 2022-03-27 DIAGNOSIS — Z79890 Hormone replacement therapy (postmenopausal): Secondary | ICD-10-CM | POA: Diagnosis not present

## 2022-03-27 DIAGNOSIS — Z79899 Other long term (current) drug therapy: Secondary | ICD-10-CM | POA: Diagnosis not present

## 2022-03-27 DIAGNOSIS — Z87891 Personal history of nicotine dependence: Secondary | ICD-10-CM | POA: Insufficient documentation

## 2022-03-27 LAB — CBC WITH DIFFERENTIAL (CANCER CENTER ONLY)
Abs Immature Granulocytes: 0.01 10*3/uL (ref 0.00–0.07)
Basophils Absolute: 0.1 10*3/uL (ref 0.0–0.1)
Basophils Relative: 1 %
Eosinophils Absolute: 0.1 10*3/uL (ref 0.0–0.5)
Eosinophils Relative: 2 %
HCT: 45.1 % (ref 36.0–46.0)
Hemoglobin: 15 g/dL (ref 12.0–15.0)
Immature Granulocytes: 0 %
Lymphocytes Relative: 23 %
Lymphs Abs: 2.1 10*3/uL (ref 0.7–4.0)
MCH: 31.1 pg (ref 26.0–34.0)
MCHC: 33.3 g/dL (ref 30.0–36.0)
MCV: 93.4 fL (ref 80.0–100.0)
Monocytes Absolute: 0.4 10*3/uL (ref 0.1–1.0)
Monocytes Relative: 5 %
Neutro Abs: 6.7 10*3/uL (ref 1.7–7.7)
Neutrophils Relative %: 69 %
Platelet Count: 271 10*3/uL (ref 150–400)
RBC: 4.83 MIL/uL (ref 3.87–5.11)
RDW: 13.7 % (ref 11.5–15.5)
WBC Count: 9.5 10*3/uL (ref 4.0–10.5)
nRBC: 0 % (ref 0.0–0.2)

## 2022-03-27 LAB — CMP (CANCER CENTER ONLY)
ALT: 10 U/L (ref 0–44)
AST: 15 U/L (ref 15–41)
Albumin: 4.5 g/dL (ref 3.5–5.0)
Alkaline Phosphatase: 107 U/L (ref 38–126)
Anion gap: 7 (ref 5–15)
BUN: 15 mg/dL (ref 8–23)
CO2: 25 mmol/L (ref 22–32)
Calcium: 9.9 mg/dL (ref 8.9–10.3)
Chloride: 106 mmol/L (ref 98–111)
Creatinine: 0.99 mg/dL (ref 0.44–1.00)
GFR, Estimated: >60 mL/min (ref >60)
Glucose, Bld: 142 mg/dL — ABNORMAL HIGH (ref 70–99)
Potassium: 3.9 mmol/L (ref 3.5–5.1)
Sodium: 138 mmol/L (ref 135–145)
Total Bilirubin: 0.4 mg/dL (ref 0.3–1.2)
Total Protein: 7.2 g/dL (ref 6.5–8.1)

## 2022-03-27 LAB — IRON AND IRON BINDING CAPACITY (CC-WL,HP ONLY)
Iron: 75 ug/dL (ref 28–170)
Saturation Ratios: 20 % (ref 10.4–31.8)
TIBC: 375 ug/dL (ref 250–450)
UIBC: 300 ug/dL (ref 148–442)

## 2022-03-27 LAB — VITAMIN B12: Vitamin B-12: 294 pg/mL (ref 180–914)

## 2022-03-27 LAB — FERRITIN: Ferritin: 46 ng/mL (ref 11–307)

## 2022-03-27 MED ORDER — ROSUVASTATIN CALCIUM 5 MG PO TABS: 5.0000 mg | ORAL_TABLET | ORAL | Status: AC

## 2022-03-27 MED ORDER — VITAMIN D (ERGOCALCIFEROL) 1.25 MG (50000 UNIT) PO CAPS: ORAL_CAPSULE | ORAL | 1 refills | Status: AC

## 2022-04-03 ENCOUNTER — Encounter: Payer: Self-pay | Admitting: Hematology
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
## 2022-04-06 DIAGNOSIS — F331 Major depressive disorder, recurrent, moderate: Secondary | ICD-10-CM | POA: Diagnosis not present

## 2022-04-09 ENCOUNTER — Telehealth: Payer: Self-pay | Admitting: Physician Assistant

## 2022-04-09 DIAGNOSIS — F331 Major depressive disorder, recurrent, moderate: Secondary | ICD-10-CM | POA: Diagnosis not present

## 2022-04-09 NOTE — Telephone Encounter (Signed)
Patient called and stated she received a letter for past sx, but was dated wrong. Pt is requesting a new letter with correct date. She stated she will call tomorrow to see if it is ready for her to pick up

## 2022-04-10 ENCOUNTER — Encounter: Payer: Self-pay | Admitting: Surgical

## 2022-04-10 ENCOUNTER — Encounter: Payer: Self-pay | Admitting: Plastic Surgery

## 2022-04-27 ENCOUNTER — Telehealth: Payer: 59 | Admitting: Physician Assistant

## 2022-04-27 DIAGNOSIS — J069 Acute upper respiratory infection, unspecified: Secondary | ICD-10-CM

## 2022-04-27 MED ORDER — IPRATROPIUM BROMIDE 0.03 % NA SOLN: 2.0000 | Freq: Two times a day (BID) | NASAL | 0 refills | Status: AC

## 2022-04-27 MED ORDER — PROMETHAZINE-DM 6.25-15 MG/5ML PO SYRP: 5.0000 mL | ORAL_SOLUTION | Freq: Four times a day (QID) | ORAL | 0 refills | Status: DC | PRN

## 2022-04-27 NOTE — Progress Notes (Signed)
E-Visit for Upper Respiratory Infection   We are sorry you are not feeling well.  Here is how we plan to help!  Based on what you have shared with me, it looks like you may have a viral upper respiratory infection.  Upper respiratory infections are caused by a large number of viruses; however, rhinovirus is the most common cause.   Symptoms vary from person to person, with common symptoms including sore throat, cough, fatigue or lack of energy and feeling of general discomfort.  A low-grade fever of up to 100.4 may present, but is often uncommon.  Symptoms vary however, and are closely related to a person's age or underlying illnesses.  The most common symptoms associated with an upper respiratory infection are nasal discharge or congestion, cough, sneezing, headache and pressure in the ears and face.  These symptoms usually persist for about 3 to 10 days, but can last up to 2 weeks.  It is important to know that upper respiratory infections do not cause serious illness or complications in most cases.    Upper respiratory infections can be transmitted from person to person, with the most common method of transmission being a person's hands.  The virus is able to live on the skin and can infect other persons for up to 2 hours after direct contact.  Also, these can be transmitted when someone coughs or sneezes; thus, it is important to cover the mouth to reduce this risk.  To keep the spread of the illness at [place], good hand hygiene is very important.  This is an infection that is most likely caused by a virus. There are no specific treatments other than to help you with the symptoms until the infection runs its course.  We are sorry you are not feeling well.  Here is how we plan to help!   For nasal congestion, you may use an oral decongestants such as Mucinex D or if you have glaucoma or high blood pressure use plain Mucinex.  Saline nasal spray or nasal drops can help and can safely be used as often as  needed for congestion.  For your congestion, I have prescribed Ipratropium Bromide nasal spray 0.03% two sprays in each nostril 2-3 times a day  If you do not have a history of heart disease, hypertension, diabetes or thyroid disease, prostate/bladder issues or glaucoma, you may also use Sudafed to treat nasal congestion.  It is highly recommended that you consult with a pharmacist or your primary care physician to ensure this medication is safe for you to take.     If you have a cough, you may use cough suppressants such as Delsym and Robitussin.  If you have glaucoma or high blood pressure, you can also use Coricidin HBP.   For cough I have prescribed for you Promethazine DM cough syrup Take 68m every 6 hours as needed for cough.   If you have a sore or scratchy throat, use a saltwater gargle-  to  teaspoon of salt dissolved in a 4-ounce to 8-ounce glass of warm water.  Gargle the solution for approximately 15-30 seconds and then spit.  It is important not to swallow the solution.  You can also use throat lozenges/cough drops and Chloraseptic spray to help with throat pain or discomfort.  Warm or cold liquids can also be helpful in relieving throat pain.  For headache, pain or general discomfort, you can use Ibuprofen or Tylenol as directed.   Some authorities believe that zinc sprays or  the use of Echinacea may shorten the course of your symptoms.   HOME CARE Only take medications as instructed by your medical team. Be sure to drink plenty of fluids. Water is fine as well as fruit juices, sodas and electrolyte beverages. You may want to stay away from caffeine or alcohol. If you are nauseated, try taking small sips of liquids. How do you know if you are getting enough fluid? Your urine should be a pale yellow or almost colorless. Get rest. Taking a steamy shower or using a humidifier may help nasal congestion and ease sore throat pain. You can place a towel over your head and breathe in the  steam from hot water coming from a faucet. Using a saline nasal spray works much the same way. Cough drops, hard candies and sore throat lozenges may ease your cough. Avoid close contacts especially the very young and the elderly Cover your mouth if you cough or sneeze Always remember to wash your hands.   GET HELP RIGHT AWAY IF: You develop worsening fever. If your symptoms do not improve within 10 days You develop yellow or green discharge from your nose over 3 days. You have coughing fits You develop a severe head ache or visual changes. You develop shortness of breath, difficulty breathing or start having chest pain Your symptoms persist after you have completed your treatment plan  MAKE SURE YOU  Understand these instructions. Will watch your condition. Will get help right away if you are not doing well or get worse.  Thank you for choosing an e-visit.  Your e-visit answers were reviewed by a board certified advanced clinical practitioner to complete your personal care plan. Depending upon the condition, your plan could have included both over the counter or prescription medications.  Please review your pharmacy choice. Make sure the pharmacy is open so you can pick up prescription now. If there is a problem, you may contact your provider through [employer] and have the prescription routed to another pharmacy.  Your safety is important to [place]. If you have drug allergies check your prescription carefully.   For the next 24 hours you can use MyChart to ask questions about today's visit, request a non-urgent call back, or ask for a work or school excuse. You will get an email in the next two days asking about your experience. I hope that your e-visit has been valuable and will speed your recovery.   I have spent 5 minutes in review of e-visit questionnaire, review and updating patient chart, medical decision making and response to patient.   Mar Daring, PA-C

## 2022-05-07 ENCOUNTER — Telehealth: Payer: 59 | Admitting: Physician Assistant

## 2022-05-07 DIAGNOSIS — J208 Acute bronchitis due to other specified organisms: Secondary | ICD-10-CM | POA: Diagnosis not present

## 2022-05-07 DIAGNOSIS — B9689 Other specified bacterial agents as the cause of diseases classified elsewhere: Secondary | ICD-10-CM

## 2022-05-07 MED ORDER — AZITHROMYCIN 250 MG PO TABS: ORAL_TABLET | ORAL | 0 refills | Status: AC

## 2022-05-07 MED ORDER — PROMETHAZINE-DM 6.25-15 MG/5ML PO SYRP: 5.0000 mL | ORAL_SOLUTION | Freq: Four times a day (QID) | ORAL | 0 refills | Status: AC | PRN

## 2022-05-07 NOTE — Progress Notes (Signed)
We are sorry that you are not feeling well.  Here is how we plan to help!  Based on your presentation I believe you most likely have A cough due to bacteria.  When patients have a fever and a productive cough with a change in color or increased sputum production, we are concerned about bacterial bronchitis.  If left untreated it can progress to pneumonia.  If your symptoms do not improve with your treatment plan it is important that you contact your provider.   I have prescribed Azithromyin 250 mg: two tablets now and then one tablet daily for 4 additonal days    In addition you may use the prescription cough syrup given at last visit -- promethazine-dm. If you need a refill of this, let me know. We cannot prescribe medications with codeine in them via e-visit.   From your responses in the eVisit questionnaire you describe inflammation in the upper respiratory tract which is causing a significant cough.  This is commonly called Bronchitis and has four common causes:   Allergies Viral Infections Acid Reflux Bacterial Infection Allergies, viruses and acid reflux are treated by controlling symptoms or eliminating the cause. An example might be a cough caused by taking certain blood pressure medications. You stop the cough by changing the medication. Another example might be a cough caused by acid reflux. Controlling the reflux helps control the cough.  USE OF BRONCHODILATOR ("RESCUE") INHALERS: There is a risk from using your bronchodilator too frequently.  The risk is that over-reliance on a medication which only relaxes the muscles surrounding the breathing tubes can reduce the effectiveness of medications prescribed to reduce swelling and congestion of the tubes themselves.  Although you feel brief relief from the bronchodilator inhaler, your asthma may actually be worsening with the tubes becoming more swollen and filled with mucus.  This can delay other crucial treatments, such as oral steroid  medications. If you need to use a bronchodilator inhaler daily, several times per day, you should discuss this with your provider.  There are probably better treatments that could be used to keep your asthma under control.     HOME CARE Only take medications as instructed by your medical team. Complete the entire course of an antibiotic. Drink plenty of fluids and get plenty of rest. Avoid close contacts especially the very young and the elderly Cover your mouth if you cough or cough into your sleeve. Always remember to wash your hands A steam or ultrasonic humidifier can help congestion.   GET HELP RIGHT AWAY IF: You develop worsening fever. You become short of breath You cough up blood. Your symptoms persist after you have completed your treatment plan MAKE SURE YOU  Understand these instructions. Will watch your condition. Will get help right away if you are not doing well or get worse.    Thank you for choosing an e-visit.  Your e-visit answers were reviewed by a board certified advanced clinical practitioner to complete your personal care plan. Depending upon the condition, your plan could have included both over the counter or prescription medications.  Please review your pharmacy choice. Make sure the pharmacy is open so you can pick up prescription now. If there is a problem, you may contact your provider through [employer] and have the prescription routed to another pharmacy.  Your safety is important to [place]. If you have drug allergies check your prescription carefully.   For the next 24 hours you can use MyChart to ask questions about  today's visit, request a non-urgent call back, or ask for a work or school excuse. You will get an email in the next two days asking about your experience. I hope that your e-visit has been valuable and will speed your recovery.

## 2022-05-07 NOTE — Addendum Note (Signed)
Addended by: Brunetta Jeans on: 05/07/2022 03:42 PM   Modules accepted: Orders

## 2022-05-07 NOTE — Progress Notes (Signed)
I have spent 5 minutes in review of e-visit questionnaire, review and updating patient chart, medical decision making and response to patient.   Lavora Brisbon Cody Zoii Florer, PA-C    

## 2022-06-05 DIAGNOSIS — F331 Major depressive disorder, recurrent, moderate: Secondary | ICD-10-CM | POA: Diagnosis not present

## 2022-06-17 DIAGNOSIS — L821 Other seborrheic keratosis: Secondary | ICD-10-CM | POA: Diagnosis not present

## 2022-06-17 DIAGNOSIS — L814 Other melanin hyperpigmentation: Secondary | ICD-10-CM | POA: Diagnosis not present

## 2022-06-17 DIAGNOSIS — D2271 Melanocytic nevi of right lower limb, including hip: Secondary | ICD-10-CM | POA: Diagnosis not present

## 2022-06-17 DIAGNOSIS — D225 Melanocytic nevi of trunk: Secondary | ICD-10-CM | POA: Diagnosis not present

## 2022-06-26 DIAGNOSIS — J189 Pneumonia, unspecified organism: Secondary | ICD-10-CM | POA: Diagnosis not present

## 2022-06-30 DIAGNOSIS — R051 Acute cough: Secondary | ICD-10-CM | POA: Diagnosis not present

## 2022-06-30 DIAGNOSIS — J189 Pneumonia, unspecified organism: Secondary | ICD-10-CM | POA: Diagnosis not present

## 2022-06-30 DIAGNOSIS — F17200 Nicotine dependence, unspecified, uncomplicated: Secondary | ICD-10-CM | POA: Diagnosis not present

## 2022-07-08 ENCOUNTER — Ambulatory Visit
Admission: RE | Admit: 2022-07-08 | Discharge: 2022-07-08 | Disposition: A | Discharge status: Home / Self Care | Payer: 59 | Source: Ambulatory Visit | Attending: Family Medicine | Admitting: Family Medicine

## 2022-07-08 DIAGNOSIS — Z1231 Encounter for screening mammogram for malignant neoplasm of breast: Secondary | ICD-10-CM

## 2022-07-31 DIAGNOSIS — F331 Major depressive disorder, recurrent, moderate: Secondary | ICD-10-CM | POA: Diagnosis not present

## 2022-08-07 DIAGNOSIS — M25511 Pain in right shoulder: Secondary | ICD-10-CM | POA: Diagnosis not present

## 2022-09-03 DIAGNOSIS — F331 Major depressive disorder, recurrent, moderate: Secondary | ICD-10-CM | POA: Diagnosis not present

## 2022-09-04 ENCOUNTER — Other Ambulatory Visit: Payer: Self-pay | Admitting: Family Medicine

## 2022-09-04 DIAGNOSIS — E2839 Other primary ovarian failure: Secondary | ICD-10-CM

## 2022-09-12 DIAGNOSIS — M25511 Pain in right shoulder: Secondary | ICD-10-CM | POA: Diagnosis not present

## 2022-09-21 DIAGNOSIS — F331 Major depressive disorder, recurrent, moderate: Secondary | ICD-10-CM | POA: Diagnosis not present

## 2022-09-28 DIAGNOSIS — M25511 Pain in right shoulder: Secondary | ICD-10-CM | POA: Diagnosis not present

## 2022-09-29 DIAGNOSIS — R051 Acute cough: Secondary | ICD-10-CM | POA: Diagnosis not present

## 2022-09-29 DIAGNOSIS — B349 Viral infection, unspecified: Secondary | ICD-10-CM | POA: Diagnosis not present

## 2022-09-29 DIAGNOSIS — R0981 Nasal congestion: Secondary | ICD-10-CM | POA: Diagnosis not present

## 2022-09-29 DIAGNOSIS — R52 Pain, unspecified: Secondary | ICD-10-CM | POA: Diagnosis not present

## 2022-09-29 DIAGNOSIS — Z03818 Encounter for observation for suspected exposure to other biological agents ruled out: Secondary | ICD-10-CM | POA: Diagnosis not present

## 2022-10-22 DIAGNOSIS — M75111 Incomplete rotator cuff tear or rupture of right shoulder, not specified as traumatic: Secondary | ICD-10-CM | POA: Diagnosis not present

## 2022-10-22 DIAGNOSIS — M7541 Impingement syndrome of right shoulder: Secondary | ICD-10-CM | POA: Diagnosis not present

## 2022-11-13 ENCOUNTER — Other Ambulatory Visit: Payer: Self-pay

## 2022-11-13 DIAGNOSIS — D500 Iron deficiency anemia secondary to blood loss (chronic): Secondary | ICD-10-CM

## 2022-11-13 MED ORDER — CYANOCOBALAMIN 1000 MCG/ML IJ SOLN: INTRAMUSCULAR | 11 refills | Status: DC

## 2023-02-04 DIAGNOSIS — F331 Major depressive disorder, recurrent, moderate: Secondary | ICD-10-CM | POA: Diagnosis not present

## 2023-03-03 DIAGNOSIS — F331 Major depressive disorder, recurrent, moderate: Secondary | ICD-10-CM | POA: Diagnosis not present

## 2023-03-05 ENCOUNTER — Encounter: Payer: Self-pay | Admitting: Hematology

## 2023-03-08 DIAGNOSIS — E782 Mixed hyperlipidemia: Secondary | ICD-10-CM | POA: Diagnosis not present

## 2023-03-08 DIAGNOSIS — J069 Acute upper respiratory infection, unspecified: Secondary | ICD-10-CM | POA: Diagnosis not present

## 2023-03-08 DIAGNOSIS — E039 Hypothyroidism, unspecified: Secondary | ICD-10-CM | POA: Diagnosis not present

## 2023-03-08 DIAGNOSIS — E119 Type 2 diabetes mellitus without complications: Secondary | ICD-10-CM | POA: Diagnosis not present

## 2023-03-08 DIAGNOSIS — R051 Acute cough: Secondary | ICD-10-CM | POA: Diagnosis not present

## 2023-03-08 DIAGNOSIS — F330 Major depressive disorder, recurrent, mild: Secondary | ICD-10-CM | POA: Diagnosis not present

## 2023-03-12 ENCOUNTER — Encounter: Payer: Self-pay | Admitting: Hematology

## 2023-03-12 ENCOUNTER — Ambulatory Visit
Admission: RE | Admit: 2023-03-12 | Discharge: 2023-03-12 | Disposition: A | Discharge status: Home / Self Care | Payer: Medicare Other | Source: Ambulatory Visit | Attending: Family Medicine | Admitting: Family Medicine

## 2023-03-12 DIAGNOSIS — E2839 Other primary ovarian failure: Secondary | ICD-10-CM

## 2023-03-12 DIAGNOSIS — Z90722 Acquired absence of ovaries, bilateral: Secondary | ICD-10-CM | POA: Diagnosis not present

## 2023-03-12 DIAGNOSIS — N958 Other specified menopausal and perimenopausal disorders: Secondary | ICD-10-CM | POA: Diagnosis not present

## 2023-03-12 DIAGNOSIS — M8588 Other specified disorders of bone density and structure, other site: Secondary | ICD-10-CM | POA: Diagnosis not present

## 2023-03-12 DIAGNOSIS — E349 Endocrine disorder, unspecified: Secondary | ICD-10-CM | POA: Diagnosis not present

## 2023-03-22 DIAGNOSIS — M810 Age-related osteoporosis without current pathological fracture: Secondary | ICD-10-CM | POA: Diagnosis not present

## 2023-03-22 DIAGNOSIS — E119 Type 2 diabetes mellitus without complications: Secondary | ICD-10-CM | POA: Diagnosis not present

## 2023-03-22 DIAGNOSIS — R5383 Other fatigue: Secondary | ICD-10-CM | POA: Diagnosis not present

## 2023-03-22 DIAGNOSIS — Z23 Encounter for immunization: Secondary | ICD-10-CM | POA: Diagnosis not present

## 2023-03-22 DIAGNOSIS — D509 Iron deficiency anemia, unspecified: Secondary | ICD-10-CM | POA: Diagnosis not present

## 2023-03-22 DIAGNOSIS — E559 Vitamin D deficiency, unspecified: Secondary | ICD-10-CM | POA: Diagnosis not present

## 2023-03-22 DIAGNOSIS — R053 Chronic cough: Secondary | ICD-10-CM | POA: Diagnosis not present

## 2023-03-22 DIAGNOSIS — E538 Deficiency of other specified B group vitamins: Secondary | ICD-10-CM | POA: Diagnosis not present

## 2023-03-22 DIAGNOSIS — E039 Hypothyroidism, unspecified: Secondary | ICD-10-CM | POA: Diagnosis not present

## 2023-03-26 ENCOUNTER — Other Ambulatory Visit: Payer: Self-pay

## 2023-03-26 DIAGNOSIS — D500 Iron deficiency anemia secondary to blood loss (chronic): Secondary | ICD-10-CM

## 2023-03-27 ENCOUNTER — Encounter: Payer: Self-pay | Admitting: Hematology

## 2023-03-29 ENCOUNTER — Telehealth: Payer: Self-pay | Admitting: Hematology

## 2023-03-29 NOTE — Telephone Encounter (Signed)
Scheduled appointments per referral. Patient is aware of the appointment time and date as well as the address. Patient was informed to arrive 10-15 minutes prior with updated insurance information. All questions were answered.

## 2023-03-30 ENCOUNTER — Inpatient Hospital Stay: Payer: Medicare Other

## 2023-03-30 ENCOUNTER — Inpatient Hospital Stay: Payer: Medicare Other | Admitting: Hematology

## 2023-06-22 ENCOUNTER — Other Ambulatory Visit: Payer: Self-pay | Admitting: Family Medicine

## 2023-06-22 DIAGNOSIS — Z1231 Encounter for screening mammogram for malignant neoplasm of breast: Secondary | ICD-10-CM

## 2023-06-23 DIAGNOSIS — D2271 Melanocytic nevi of right lower limb, including hip: Secondary | ICD-10-CM | POA: Diagnosis not present

## 2023-06-23 DIAGNOSIS — L821 Other seborrheic keratosis: Secondary | ICD-10-CM | POA: Diagnosis not present

## 2023-06-23 DIAGNOSIS — L814 Other melanin hyperpigmentation: Secondary | ICD-10-CM | POA: Diagnosis not present

## 2023-06-23 DIAGNOSIS — D225 Melanocytic nevi of trunk: Secondary | ICD-10-CM | POA: Diagnosis not present

## 2023-06-25 DIAGNOSIS — H04123 Dry eye syndrome of bilateral lacrimal glands: Secondary | ICD-10-CM | POA: Diagnosis not present

## 2023-06-25 DIAGNOSIS — H3554 Dystrophies primarily involving the retinal pigment epithelium: Secondary | ICD-10-CM | POA: Diagnosis not present

## 2023-06-25 DIAGNOSIS — H524 Presbyopia: Secondary | ICD-10-CM | POA: Diagnosis not present

## 2023-06-25 DIAGNOSIS — H40013 Open angle with borderline findings, low risk, bilateral: Secondary | ICD-10-CM | POA: Diagnosis not present

## 2023-06-25 DIAGNOSIS — H0288A Meibomian gland dysfunction right eye, upper and lower eyelids: Secondary | ICD-10-CM | POA: Diagnosis not present

## 2023-06-29 DIAGNOSIS — F331 Major depressive disorder, recurrent, moderate: Secondary | ICD-10-CM | POA: Diagnosis not present

## 2023-07-12 ENCOUNTER — Encounter: Payer: Self-pay | Admitting: Hematology

## 2023-07-12 ENCOUNTER — Ambulatory Visit
Admission: RE | Admit: 2023-07-12 | Discharge: 2023-07-12 | Disposition: A | Discharge status: Home / Self Care | Payer: Medicare Other | Source: Ambulatory Visit | Attending: Family Medicine | Admitting: Family Medicine

## 2023-07-12 DIAGNOSIS — Z1231 Encounter for screening mammogram for malignant neoplasm of breast: Secondary | ICD-10-CM

## 2023-07-16 DIAGNOSIS — Z23 Encounter for immunization: Secondary | ICD-10-CM | POA: Diagnosis not present

## 2023-08-02 DIAGNOSIS — F331 Major depressive disorder, recurrent, moderate: Secondary | ICD-10-CM | POA: Diagnosis not present

## 2023-08-30 DIAGNOSIS — E039 Hypothyroidism, unspecified: Secondary | ICD-10-CM | POA: Diagnosis not present

## 2023-08-30 DIAGNOSIS — M1990 Unspecified osteoarthritis, unspecified site: Secondary | ICD-10-CM | POA: Diagnosis not present

## 2023-08-30 DIAGNOSIS — E782 Mixed hyperlipidemia: Secondary | ICD-10-CM | POA: Diagnosis not present

## 2023-08-30 DIAGNOSIS — F330 Major depressive disorder, recurrent, mild: Secondary | ICD-10-CM | POA: Diagnosis not present

## 2023-09-20 ENCOUNTER — Encounter: Payer: Self-pay | Admitting: Hematology

## 2023-09-27 ENCOUNTER — Inpatient Hospital Stay: Payer: Medicare Other

## 2023-09-27 ENCOUNTER — Inpatient Hospital Stay: Payer: Medicare Other | Attending: Hematology | Admitting: Hematology

## 2023-09-29 DIAGNOSIS — M1990 Unspecified osteoarthritis, unspecified site: Secondary | ICD-10-CM | POA: Diagnosis not present

## 2023-09-29 DIAGNOSIS — E039 Hypothyroidism, unspecified: Secondary | ICD-10-CM | POA: Diagnosis not present

## 2023-09-29 DIAGNOSIS — E782 Mixed hyperlipidemia: Secondary | ICD-10-CM | POA: Diagnosis not present

## 2023-09-29 DIAGNOSIS — F330 Major depressive disorder, recurrent, mild: Secondary | ICD-10-CM | POA: Diagnosis not present

## 2023-10-08 DIAGNOSIS — H40013 Open angle with borderline findings, low risk, bilateral: Secondary | ICD-10-CM | POA: Diagnosis not present

## 2023-10-11 DIAGNOSIS — Z0000 Encounter for general adult medical examination without abnormal findings: Secondary | ICD-10-CM | POA: Diagnosis not present

## 2023-10-11 DIAGNOSIS — E663 Overweight: Secondary | ICD-10-CM | POA: Diagnosis not present

## 2023-10-11 DIAGNOSIS — G4700 Insomnia, unspecified: Secondary | ICD-10-CM | POA: Diagnosis not present

## 2023-10-11 DIAGNOSIS — Z1331 Encounter for screening for depression: Secondary | ICD-10-CM | POA: Diagnosis not present

## 2023-10-11 DIAGNOSIS — F330 Major depressive disorder, recurrent, mild: Secondary | ICD-10-CM | POA: Diagnosis not present

## 2023-10-11 DIAGNOSIS — E782 Mixed hyperlipidemia: Secondary | ICD-10-CM | POA: Diagnosis not present

## 2023-10-11 DIAGNOSIS — E039 Hypothyroidism, unspecified: Secondary | ICD-10-CM | POA: Diagnosis not present

## 2023-10-11 DIAGNOSIS — E611 Iron deficiency: Secondary | ICD-10-CM | POA: Diagnosis not present

## 2023-10-11 DIAGNOSIS — M810 Age-related osteoporosis without current pathological fracture: Secondary | ICD-10-CM | POA: Diagnosis not present

## 2023-10-11 DIAGNOSIS — R519 Headache, unspecified: Secondary | ICD-10-CM | POA: Diagnosis not present

## 2023-10-11 DIAGNOSIS — R7303 Prediabetes: Secondary | ICD-10-CM | POA: Diagnosis not present

## 2023-10-12 NOTE — Progress Notes (Signed)
 HEMATOLOGY/ONCOLOGY CLINIC NOTE  Date of Service: 10/13/2023     Patient Care Team: Ladene Pickle, MD as PCP - General (Family Medicine)  CHIEF COMPLAINTS/PURPOSE OF CONSULTATION:  Follow-up for iron  deficiency anemia  HISTORY OF PRESENTING ILLNESS:  Theresa Clay is a wonderful 68 y.o. female who has been referred to us  by Dr Ladene Pickle, MD  for evaluation and management of Iron  deficiency Anemia to consider IV Iron  infusion.  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  Patient has recently noted significantly increased fatigue and exhaustion. She notes that she has developed food craving for boiled potatoes. Labs done with her primary care physician recently on 09/22/2016 showed a hemoglobin of 9.2 with a ferritin of 3.2 she notes no overt GI bleeding. Likely has absorption issues related to her Roux-en-Y gastric bypass surgery.   She was previously on by mouth iron  replacement for the last 3 years but stopped it due to GI intolerance. Has been on daily multivitamin but no specific B12 or other B vitamin replacements at this time . Patient notes that she has had B12 deficiency in the past and was on B12 shots until 4 years ago. Has been on calcium  and vitamin D .   Patient notes no overt GI bleeding no hematemesis no melena no hematochezia no hematuria no epistaxis or gum bleeding. Patient notes she last had a colonoscopy about 2 years ago and whether to have a benign polyp but no other issues with GI bleeding. Had an EGD in 2014 which was unrevealing.  Patient notes that she doesn't think she can tolerate oral iron  replacement and really wants her iron  replaced since she feels "lousy".  No acute weight loss. No other new focal symptoms.  INTERVAL HISTORY  Theresa Clay  is a 68 y.o. female here for continued evaluation and management of her iron  deficiency anemia.   Patient was last seen by me on 03/27/2022 and reported some weight loss due to diet/exercise, liposuction, and tummy tuck. She was doing well overall with no new medical complaints.   Today, she reports that she has been doing well overall since her last visit with us  over 1 year ago.   She reports that she is taking acid suppressants and continues to take B12 shots regularly.   Patient reports that she recently received iron  labs with her PCP on Monday, 10/11/2023, which showed ferritin low at 19.8 and iron  saturation 17%.   She reports that she last received IV iron  more than 2 years ago.   Patient reports that her energy level is fairly low at this time.   She denies any bleeding concerns such as black stools, blood in stools, nose bleeds, or gum bleeds.   Patient reports having some upper extremity bruising due to frequent trauma to the arm from accidental contact to doors.   She reports that she generally follows with her PCP every 6 months.   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     SURGICAL HISTORY: Past Surgical History:  Procedure Laterality Date   BREAST BIOPSY Left 01/09/2008   BREAST LIFT Right 10/2020   BREAST LUMPECTOMY Left 01/2008   CHOLECYSTECTOMY     GASTRIC BYPASS     KNEE ARTHROSCOPY Left 08/09/2012   Procedure: ARTHROSCOPY LEFT KNEE chondroplasty;  Surgeon: Alphonzo Ask,  MD;  [facility];  Service: Orthopedics;  Laterality: Left;   KNEE SURGERY  10/2011   reconstruction   TONSILLECTOMY     TOTAL ABDOMINAL HYSTERECTOMY W/ BILATERAL SALPINGOOPHORECTOMY      SOCIAL HISTORY: Social History   Socioeconomic History   Marital status: Married    Spouse name: Not on file   Number of children: 1   Years of education: college   Highest education level: Not on file  Occupational History   Occupation: disabled    Comment: for [employer]  Tobacco Use   Smoking status: Former    Current packs/day: 0.00    Average packs/day: 0.5 packs/day for 5.0 years (2.5 ttl pk-yrs)    Types: Cigarettes    Start date: 07/31/2015    Quit date: 07/30/2020    Years since quitting: 3.2   Smokeless tobacco: Never   Tobacco comments:    5-6 cigarettes daily  Vaping Use   Vaping status: Never Used  Substance and Sexual Activity   Alcohol use: No   Drug use: No   Sexual activity: Yes    Birth control/protection: Surgical  Other Topics Concern   Not on file  Social History Narrative   Not on file   Social Drivers of Health   Financial Resource Strain: Not on file  Food Insecurity: Not on file  Transportation Needs: Not on file  Physical Activity: Not on file  Stress: Not on file  Social Connections: Unknown (10/10/2021)   [facility]   Social Network    Social Network: Not on file  Intimate Partner Violence: Unknown (09/02/2021)   Received from [facility], [facility]   HITS    Physically Hurt: Not on file    Insult or Talk Down To: Not on file    Threaten Physical Harm: Not on file    Scream or Curse: Not on file    FAMILY HISTORY: Family History  Problem Relation Age of Onset   Breast cancer Mother 3   Melanoma Mother    Dementia Father    Prostate cancer Maternal Grandfather     ALLERGIES:  is allergic to darvocet [propoxyphene n-acetaminophen ], penicillins, and simvastatin.  MEDICATIONS:  Current  Outpatient Medications  Medication Sig Dispense Refill   amphetamine -dextroamphetamine  (ADDERALL XR) 20 MG 24 hr capsule Take 20 mg by mouth 2 (two) times daily.     cyanocobalamin  (VITAMIN B12) 1000 MCG/ML injection INJECT 1 ML SUBCUTANEOUSLY EVERY MONTH 1 mL 11   Diclofenac Sodium 3 % GEL APPLY 4GRAMS TO AFFECTED AREA(S) TWO TIMES A DAY     diphenhydrAMINE  (BENADRYL ) 25 MG tablet      escitalopram (LEXAPRO) 10 MG tablet Take 10 mg by mouth daily.     ipratropium (ATROVENT ) 0.03 % nasal spray Place 2 sprays into both nostrils every 12 (twelve) hours. 30 mL 0   levothyroxine  (SYNTHROID ) 100 MCG tablet Take 1 tablet by mouth daily before breakfast.  linaclotide (LINZESS) 290 MCG CAPS capsule Take 290 mcg by mouth daily before breakfast.     LORazepam  (ATIVAN ) 1 MG tablet Take 0.5-1 mg by mouth 2 (two) times daily. Take 1mg  at night and 0.5mg  in the morning     mupirocin ointment (BACTROBAN) 2 % Apply topically 3 (three) times daily.     nystatin-triamcinolone (MYCOLOG II) cream 1 application     ondansetron  (ZOFRAN ) 4 MG tablet Take 1 tablet (4 mg total) by mouth every 8 (eight) hours as needed for nausea or vomiting. 20 tablet 0   ondansetron  (ZOFRAN -ODT) 4 MG disintegrating tablet Take 1 tablet (4 mg total) by mouth every 8 (eight) hours as needed for nausea or vomiting. 20 tablet 0   PREVIDENT 5000 BOOSTER PLUS 1.1 % PSTE See admin instructions.     promethazine -dextromethorphan (PROMETHAZINE -DM) 6.25-15 MG/5ML syrup Take 5 mLs by mouth 4 (four) times daily as needed. 118 mL 0   rosuvastatin  (CRESTOR ) 5 MG tablet Take 1 tablet (5 mg total) by mouth 2 (two) times a week.     tiZANidine  (ZANAFLEX ) 4 MG tablet Take 4 mg by mouth once.     traZODone  (DESYREL ) 100 MG tablet Take 100 mg by mouth at bedtime.     triamcinolone cream (KENALOG) 0.1 % Apply topically 2 (two) times a week.     valACYclovir (VALTREX) 1000 MG tablet Take 1,000 mg by mouth daily.     Vitamin D , Ergocalciferol , (DRISDOL )  1.25 MG (50000 UNIT) CAPS capsule TAKE 1 CAPSULE BY MOUTH 2 TIMES A WEEK. 24 capsule 1   No current facility-administered medications for this visit.    REVIEW OF SYSTEMS:    10 Point review of Systems was done is negative except as noted above.     PHYSICAL EXAMINATION: ECOG PERFORMANCE STATUS: 2 - Symptomatic, <50% confined to bed  . Vitals:   10/13/23 0849  BP: 126/74  Pulse: 86  Resp: 18  Temp: 97.9 F (36.6 C)  SpO2: 100%    Filed Weights   10/13/23 0849  Weight: 136 lb 1.6 oz (61.7 kg)    .Body mass index is 24.89 kg/m.    GENERAL:alert, in no acute distress and comfortable SKIN: no acute rashes, no significant lesions EYES: conjunctiva are pink and non-injected, sclera anicteric OROPHARYNX: MMM, no exudates, no oropharyngeal erythema or ulceration NECK: supple, no JVD LYMPH:  no palpable lymphadenopathy in the cervical, axillary or inguinal regions LUNGS: clear to auscultation b/l with normal respiratory effort HEART: regular rate & rhythm ABDOMEN:  normoactive bowel sounds , non tender, not distended. Extremity: no pedal edema PSYCH: alert & oriented x 3 with fluent speech NEURO: no focal motor/sensory deficits   LABORATORY DATA:  I have reviewed the data as listed  .    Latest Ref Rng & Units 10/13/2023    8:25 AM 03/27/2022   12:32 PM 11/06/2021   10:49 AM  CBC  WBC 4.0 - 10.5 K/uL 5.1  9.5  6.9   Hemoglobin 12.0 - 15.0 g/dL 95.6  21.3  08.6   Hematocrit 36.0 - 46.0 % 44.8  45.1  46.1   Platelets 150 - 400 K/uL 254  271  259    . CBC    Component Value Date/Time   WBC 5.1 10/13/2023 0825   WBC 8.7 09/26/2020 1051   RBC 4.61 10/13/2023 0825   HGB 14.5 10/13/2023 0825   HGB 15.0 12/29/2016 0920   HCT 44.8 10/13/2023 0825   HCT 48.0 (H) 12/29/2016 0920  PLT 254 10/13/2023 0825   PLT 290 12/29/2016 0920   MCV 97.2 10/13/2023 0825   MCV 89.9 12/29/2016 0920   MCH 31.5 10/13/2023 0825   MCHC 32.4 10/13/2023 0825   RDW 12.8 10/13/2023  0825   RDW 17.5 (H) 12/29/2016 0920   LYMPHSABS 1.6 10/13/2023 0825   LYMPHSABS 1.9 12/29/2016 0920   MONOABS 0.4 10/13/2023 0825   MONOABS 0.4 12/29/2016 0920   EOSABS 0.2 10/13/2023 0825   EOSABS 0.3 12/29/2016 0920   BASOSABS 0.0 10/13/2023 0825   BASOSABS 0.0 12/29/2016 0920     .    Latest Ref Rng & Units 10/13/2023    8:25 AM 03/27/2022   12:32 PM 11/06/2021   10:49 AM  CMP  Glucose 70 - 99 mg/dL 469  629  528   BUN 8 - 23 mg/dL 13  15  14    Creatinine 0.44 - 1.00 mg/dL 4.13  2.44  0.10   Sodium 135 - 145 mmol/L 141  138  138   Potassium 3.5 - 5.1 mmol/L 3.8  3.9  3.9   Chloride 98 - 111 mmol/L 110  106  106   CO2 22 - 32 mmol/L 25  25  22    Calcium  8.9 - 10.3 mg/dL 9.6  9.9  27.2   Total Protein 6.5 - 8.1 g/dL 7.0  7.2  7.6   Total Bilirubin 0.0 - 1.2 mg/dL 0.5  0.4  0.5   Alkaline Phos 38 - 126 U/L 58  107  111   AST 15 - 41 U/L 19  15  27    ALT 0 - 44 U/L 15  10  40    . Lab Results  Component Value Date   IRON  114 10/13/2023   TIBC 433 10/13/2023   IRONPCTSAT 26 10/13/2023   (Iron  and TIBC)  Lab Results  Component Value Date   FERRITIN 33 10/13/2023   B12 ---458   RADIOGRAPHIC STUDIES: I have personally reviewed the radiological images as listed and agreed with the findings in the report. No results found.  ASSESSMENT & PLAN:   68 y.o. female with  #1 history of severe iron  deficiency anemia Likely due to poor iron  absorption in the setting of chronic PPI therapy and history of Roux-en-Y gastric bypass surgery. No overt issues with GI bleeding. Absorption seems the primary issue since the patient also has coexistent B12 deficiency and vitamin D  deficiency.  PLAN:   -Discussed lab results on 10/13/2023 in detail with patient. CBC normal, showed WBC of 5.1K, hemoglobin of 14.5, and platelets of 254K. -patient is not anemic at this time -discussed that patient is becoming iron  deficient based on recent labs with PCP -discussed that the underlying  cause of her iron  deficiency, gastric bypass, is ongoing  -iron  labs from today are pending at time of clinical visit -ferritin 33 and iron  sat of 26% -discussed ferritin goal closer to 100 so that she will not require iron  replacement as frequently -discussed option of iron  replacement with IV iron  -patient is agreeable -continue to follow with PCP every 6 months -continue to monitor iron  with PCP. She shall return to clinic with us  for repeat consideration of IV iron  if ferritin <20 or if she is symptomatic from low iron  with ferritin <50.  -answered all of patient's questions in detail   #2 vitamin B12 deficiency. Likely related to poor absorption due to chronic PPI therapy and Roux-en-Y gastric bypass surgery. Antiparietal cell and anti-intrinsic factor antibodies negative -no  evidence of pernicious anemia. No clinical evidence of gluten intolerance. B12 levels 458 PLAN: -continue [place] B12 1000 mcg q4weeks  #3 severe vitamin D  deficiency  Likely due to poor absorption due to chronic PPI therapy and Roux-en-Y gastric bypass surgery. Plan -mx per PCP  #4 . 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  FOLLOW UP: IV feraheme 510mg  weekly x 2 doses at [employer] F/u with PCP for lab monitoring q6 months as currently and RTC with Dr Salomon Cree as needed  The total time spent in the appointment was 21 minutes* .  All of the patient's questions were answered with apparent satisfaction. The patient knows to call the clinic with any problems, questions or concerns.   Jacquelyn Matt MD MS AAHIVMS [facility] Hematology/Oncology Physician [facility]  .*Total Encounter Time as defined by the Centers for Medicare and Medicaid Services includes, in addition to the face-to-face time of a patient visit (documented in the note above) non-face-to-face time: obtaining and reviewing outside history, ordering and reviewing medications, tests or procedures, care coordination (communications with other health care professionals or caregivers) and documentation in the medical record.    I,Mitra Faeizi,acting as a Neurosurgeon for Jacquelyn Matt, MD.,have documented all relevant documentation on the behalf of Jacquelyn Matt, MD,as directed by  Jacquelyn Matt, MD while in the presence of Jacquelyn Matt, MD.  .I have reviewed the above documentation for accuracy and completeness, and I agree with the above. .Alizey Noren Kishore Kage Willmann MD

## 2023-10-13 ENCOUNTER — Inpatient Hospital Stay: Attending: Hematology

## 2023-10-13 ENCOUNTER — Telehealth: Payer: Self-pay

## 2023-10-13 ENCOUNTER — Inpatient Hospital Stay (HOSPITAL_BASED_OUTPATIENT_CLINIC_OR_DEPARTMENT_OTHER): Admitting: Hematology

## 2023-10-13 VITALS — BP 126/74 | HR 86 | Temp 97.9°F | Resp 18 | Ht 62.0 in | Wt 136.1 lb

## 2023-10-13 DIAGNOSIS — D509 Iron deficiency anemia, unspecified: Secondary | ICD-10-CM | POA: Insufficient documentation

## 2023-10-13 DIAGNOSIS — Z923 Personal history of irradiation: Secondary | ICD-10-CM | POA: Diagnosis not present

## 2023-10-13 DIAGNOSIS — Z853 Personal history of malignant neoplasm of breast: Secondary | ICD-10-CM | POA: Diagnosis not present

## 2023-10-13 DIAGNOSIS — D500 Iron deficiency anemia secondary to blood loss (chronic): Secondary | ICD-10-CM

## 2023-10-13 DIAGNOSIS — Z79899 Other long term (current) drug therapy: Secondary | ICD-10-CM | POA: Diagnosis not present

## 2023-10-13 DIAGNOSIS — Z170 Estrogen receptor positive status [ER+]: Secondary | ICD-10-CM | POA: Diagnosis not present

## 2023-10-13 DIAGNOSIS — E538 Deficiency of other specified B group vitamins: Secondary | ICD-10-CM | POA: Insufficient documentation

## 2023-10-13 DIAGNOSIS — Z9884 Bariatric surgery status: Secondary | ICD-10-CM | POA: Diagnosis not present

## 2023-10-13 LAB — IRON AND IRON BINDING CAPACITY (CC-WL,HP ONLY)
Iron: 114 ug/dL (ref 28–170)
Saturation Ratios: 26 % (ref 10.4–31.8)
TIBC: 433 ug/dL (ref 250–450)
UIBC: 319 ug/dL (ref 148–442)

## 2023-10-13 LAB — CMP (CANCER CENTER ONLY)
ALT: 15 U/L (ref 0–44)
AST: 19 U/L (ref 15–41)
Albumin: 4.5 g/dL (ref 3.5–5.0)
Alkaline Phosphatase: 58 U/L (ref 38–126)
Anion gap: 6 (ref 5–15)
BUN: 13 mg/dL (ref 8–23)
CO2: 25 mmol/L (ref 22–32)
Calcium: 9.6 mg/dL (ref 8.9–10.3)
Chloride: 110 mmol/L (ref 98–111)
Creatinine: 1.11 mg/dL — ABNORMAL HIGH (ref 0.44–1.00)
GFR, Estimated: 55 mL/min — ABNORMAL LOW (ref >60)
Glucose, Bld: 113 mg/dL — ABNORMAL HIGH (ref 70–99)
Potassium: 3.8 mmol/L (ref 3.5–5.1)
Sodium: 141 mmol/L (ref 135–145)
Total Bilirubin: 0.5 mg/dL (ref 0.0–1.2)
Total Protein: 7 g/dL (ref 6.5–8.1)

## 2023-10-13 LAB — CBC WITH DIFFERENTIAL (CANCER CENTER ONLY)
Abs Immature Granulocytes: 0.01 10*3/uL (ref 0.00–0.07)
Basophils Absolute: 0 10*3/uL (ref 0.0–0.1)
Basophils Relative: 1 %
Eosinophils Absolute: 0.2 10*3/uL (ref 0.0–0.5)
Eosinophils Relative: 4 %
HCT: 44.8 % (ref 36.0–46.0)
Hemoglobin: 14.5 g/dL (ref 12.0–15.0)
Immature Granulocytes: 0 %
Lymphocytes Relative: 31 %
Lymphs Abs: 1.6 10*3/uL (ref 0.7–4.0)
MCH: 31.5 pg (ref 26.0–34.0)
MCHC: 32.4 g/dL (ref 30.0–36.0)
MCV: 97.2 fL (ref 80.0–100.0)
Monocytes Absolute: 0.4 10*3/uL (ref 0.1–1.0)
Monocytes Relative: 7 %
Neutro Abs: 2.9 10*3/uL (ref 1.7–7.7)
Neutrophils Relative %: 57 %
Platelet Count: 254 10*3/uL (ref 150–400)
RBC: 4.61 MIL/uL (ref 3.87–5.11)
RDW: 12.8 % (ref 11.5–15.5)
WBC Count: 5.1 10*3/uL (ref 4.0–10.5)
nRBC: 0 % (ref 0.0–0.2)

## 2023-10-13 LAB — FERRITIN: Ferritin: 33 ng/mL (ref 11–307)

## 2023-10-13 LAB — VITAMIN B12: Vitamin B-12: 458 pg/mL (ref 180–914)

## 2023-10-13 NOTE — Telephone Encounter (Signed)
 Dr. Salomon Cree and [facility], patient will be scheduled as soon as possible.  Auth Submission: NO AUTH NEEDED Site of care: Site of care: [facility] Payer: Medicare A/B only Medication & CPT/J Code(s) submitted: Feraheme (ferumoxytol) [id number] Route of submission (phone, fax, portal):  Phone # Fax # Auth type: Buy/Bill PB Units/visits requested: 510mg  x 2 doses Reference number:  Approval from: 10/13/23 to 02/13/24   Medicare A/B will cover 80%, patient will be responsible for the remaining 20%.

## 2023-10-18 ENCOUNTER — Ambulatory Visit

## 2023-10-18 VITALS — BP 143/80 | HR 79 | Temp 98.6°F | Resp 14 | Ht 61.5 in | Wt 135.8 lb

## 2023-10-18 DIAGNOSIS — K909 Intestinal malabsorption, unspecified: Secondary | ICD-10-CM

## 2023-10-18 DIAGNOSIS — Z9884 Bariatric surgery status: Secondary | ICD-10-CM

## 2023-10-18 DIAGNOSIS — E611 Iron deficiency: Secondary | ICD-10-CM | POA: Diagnosis not present

## 2023-10-18 DIAGNOSIS — D508 Other iron deficiency anemias: Secondary | ICD-10-CM

## 2023-10-18 MED ORDER — ACETAMINOPHEN 325 MG PO TABS
650.0000 mg | ORAL_TABLET | Freq: Once | ORAL | Status: AC
  Administered 2023-10-18: 650 mg via ORAL
  Filled 2023-10-18: qty 2

## 2023-10-18 MED ORDER — LORATADINE 10 MG PO TABS
10.0000 mg | ORAL_TABLET | Freq: Every day | ORAL | Status: DC
  Administered 2023-10-18: 10 mg via ORAL
  Filled 2023-10-18: qty 1

## 2023-10-18 MED ORDER — SODIUM CHLORIDE 0.9 % IV SOLN
510.0000 mg | Freq: Once | INTRAVENOUS | Status: AC
  Administered 2023-10-18: 510 mg via INTRAVENOUS
  Filled 2023-10-18: qty 17

## 2023-10-18 NOTE — Progress Notes (Signed)
 Diagnosis: Iron  Deficiency Anemia  Provider:  Praveen Mannam MD  Procedure: IV Infusion  IV Type: Peripheral, IV Location: L Antecubital  Feraheme (Ferumoxytol ), Dose: 510 mg  Infusion Start Time: 1414  Infusion Stop Time: 1428  Post Infusion IV Care: Observation period completed and Peripheral IV Discontinued  Discharge: Condition: Good, Destination: Home . AVS Declined  Performed by:  Lauran Pollard, LPN

## 2023-10-19 ENCOUNTER — Encounter: Payer: Self-pay | Admitting: Hematology

## 2023-10-26 ENCOUNTER — Ambulatory Visit (INDEPENDENT_AMBULATORY_CARE_PROVIDER_SITE_OTHER)

## 2023-10-26 VITALS — BP 125/74 | HR 88 | Temp 98.7°F | Resp 16 | Ht 62.0 in | Wt 138.4 lb

## 2023-10-26 DIAGNOSIS — K909 Intestinal malabsorption, unspecified: Secondary | ICD-10-CM | POA: Diagnosis not present

## 2023-10-26 DIAGNOSIS — D508 Other iron deficiency anemias: Secondary | ICD-10-CM

## 2023-10-26 DIAGNOSIS — E611 Iron deficiency: Secondary | ICD-10-CM

## 2023-10-26 DIAGNOSIS — Z9884 Bariatric surgery status: Secondary | ICD-10-CM | POA: Diagnosis not present

## 2023-10-26 MED ORDER — LORATADINE 10 MG PO TABS
10.0000 mg | ORAL_TABLET | Freq: Every day | ORAL | Status: DC
  Administered 2023-10-26: 10 mg via ORAL
  Filled 2023-10-26: qty 1

## 2023-10-26 MED ORDER — ACETAMINOPHEN 325 MG PO TABS
650.0000 mg | ORAL_TABLET | Freq: Once | ORAL | Status: AC
  Administered 2023-10-26: 650 mg via ORAL
  Filled 2023-10-26: qty 2

## 2023-10-26 MED ORDER — SODIUM CHLORIDE 0.9 % IV SOLN
510.0000 mg | Freq: Once | INTRAVENOUS | Status: AC
  Administered 2023-10-26: 510 mg via INTRAVENOUS
  Filled 2023-10-26: qty 17

## 2023-10-26 NOTE — Progress Notes (Signed)
 Diagnosis: Iron  Deficiency Anemia  Provider:  Praveen Mannam MD  Procedure: IV Infusion  IV Type: Peripheral, IV Location: L Antecubital  Feraheme (Ferumoxytol ), Dose: 510 mg  Infusion Start Time: 1147  Infusion Stop Time: 1205  Post Infusion IV Care: Patient declined observation and Peripheral IV Discontinued  Discharge: Condition: Good, Destination: Home . AVS Declined  Performed by:  Lauran Pollard, LPN

## 2023-10-27 DIAGNOSIS — F331 Major depressive disorder, recurrent, moderate: Secondary | ICD-10-CM | POA: Diagnosis not present

## 2023-10-30 DIAGNOSIS — M1990 Unspecified osteoarthritis, unspecified site: Secondary | ICD-10-CM | POA: Diagnosis not present

## 2023-10-30 DIAGNOSIS — E039 Hypothyroidism, unspecified: Secondary | ICD-10-CM | POA: Diagnosis not present

## 2023-10-30 DIAGNOSIS — F330 Major depressive disorder, recurrent, mild: Secondary | ICD-10-CM | POA: Diagnosis not present

## 2023-10-30 DIAGNOSIS — E782 Mixed hyperlipidemia: Secondary | ICD-10-CM | POA: Diagnosis not present

## 2023-11-25 DIAGNOSIS — L570 Actinic keratosis: Secondary | ICD-10-CM | POA: Diagnosis not present

## 2023-11-29 DIAGNOSIS — M1990 Unspecified osteoarthritis, unspecified site: Secondary | ICD-10-CM | POA: Diagnosis not present

## 2023-11-29 DIAGNOSIS — F330 Major depressive disorder, recurrent, mild: Secondary | ICD-10-CM | POA: Diagnosis not present

## 2023-11-29 DIAGNOSIS — E039 Hypothyroidism, unspecified: Secondary | ICD-10-CM | POA: Diagnosis not present

## 2023-11-29 DIAGNOSIS — Z09 Encounter for follow-up examination after completed treatment for conditions other than malignant neoplasm: Secondary | ICD-10-CM | POA: Diagnosis not present

## 2023-11-29 DIAGNOSIS — E782 Mixed hyperlipidemia: Secondary | ICD-10-CM | POA: Diagnosis not present

## 2023-11-29 DIAGNOSIS — K5730 Diverticulosis of large intestine without perforation or abscess without bleeding: Secondary | ICD-10-CM | POA: Diagnosis not present

## 2023-11-29 DIAGNOSIS — Z860100 Personal history of colon polyps, unspecified: Secondary | ICD-10-CM | POA: Diagnosis not present

## 2023-12-16 ENCOUNTER — Other Ambulatory Visit: Payer: Self-pay | Admitting: Hematology

## 2023-12-16 DIAGNOSIS — D500 Iron deficiency anemia secondary to blood loss (chronic): Secondary | ICD-10-CM

## 2023-12-30 DIAGNOSIS — E039 Hypothyroidism, unspecified: Secondary | ICD-10-CM | POA: Diagnosis not present

## 2023-12-30 DIAGNOSIS — E782 Mixed hyperlipidemia: Secondary | ICD-10-CM | POA: Diagnosis not present

## 2023-12-30 DIAGNOSIS — F330 Major depressive disorder, recurrent, mild: Secondary | ICD-10-CM | POA: Diagnosis not present

## 2023-12-30 DIAGNOSIS — M1990 Unspecified osteoarthritis, unspecified site: Secondary | ICD-10-CM | POA: Diagnosis not present

## 2024-03-08 DIAGNOSIS — Z23 Encounter for immunization: Secondary | ICD-10-CM | POA: Diagnosis not present

## 2024-03-23 DIAGNOSIS — F331 Major depressive disorder, recurrent, moderate: Secondary | ICD-10-CM | POA: Diagnosis not present

## 2024-03-28 DIAGNOSIS — H40013 Open angle with borderline findings, low risk, bilateral: Secondary | ICD-10-CM | POA: Diagnosis not present

## 2024-03-28 DIAGNOSIS — H04123 Dry eye syndrome of bilateral lacrimal glands: Secondary | ICD-10-CM | POA: Diagnosis not present

## 2024-04-09 DIAGNOSIS — S8992XA Unspecified injury of left lower leg, initial encounter: Secondary | ICD-10-CM | POA: Diagnosis not present

## 2024-04-10 ENCOUNTER — Other Ambulatory Visit: Payer: Self-pay

## 2024-04-10 ENCOUNTER — Telehealth: Payer: Self-pay | Admitting: Hematology

## 2024-04-10 DIAGNOSIS — D500 Iron deficiency anemia secondary to blood loss (chronic): Secondary | ICD-10-CM

## 2024-04-10 DIAGNOSIS — E538 Deficiency of other specified B group vitamins: Secondary | ICD-10-CM

## 2024-04-11 ENCOUNTER — Ambulatory Visit: Admitting: Hematology

## 2024-04-11 ENCOUNTER — Other Ambulatory Visit

## 2024-05-03 DIAGNOSIS — E039 Hypothyroidism, unspecified: Secondary | ICD-10-CM | POA: Diagnosis not present

## 2024-05-03 DIAGNOSIS — R7303 Prediabetes: Secondary | ICD-10-CM | POA: Diagnosis not present

## 2024-05-03 DIAGNOSIS — M25562 Pain in left knee: Secondary | ICD-10-CM | POA: Diagnosis not present

## 2024-05-03 DIAGNOSIS — K5909 Other constipation: Secondary | ICD-10-CM | POA: Diagnosis not present

## 2024-05-03 DIAGNOSIS — Z23 Encounter for immunization: Secondary | ICD-10-CM | POA: Diagnosis not present

## 2024-05-03 DIAGNOSIS — N1831 Chronic kidney disease, stage 3a: Secondary | ICD-10-CM | POA: Diagnosis not present

## 2024-05-03 DIAGNOSIS — M797 Fibromyalgia: Secondary | ICD-10-CM | POA: Diagnosis not present

## 2024-05-29 ENCOUNTER — Other Ambulatory Visit: Payer: Self-pay | Admitting: Family Medicine

## 2024-05-29 DIAGNOSIS — Z1231 Encounter for screening mammogram for malignant neoplasm of breast: Secondary | ICD-10-CM

## 2024-06-12 ENCOUNTER — Inpatient Hospital Stay: Admitting: Hematology

## 2024-06-12 ENCOUNTER — Inpatient Hospital Stay

## 2024-07-10 ENCOUNTER — Inpatient Hospital Stay: Admitting: Hematology

## 2024-07-10 ENCOUNTER — Inpatient Hospital Stay

## 2024-07-13 ENCOUNTER — Ambulatory Visit
